# Patient Record
Sex: Female | Born: 1945 | Race: White | Hispanic: No | Marital: Married | Smoking: Former smoker
Health system: Southern US, Community
[De-identification: ages and names within clinical notes are randomized; demographics above are authoritative.]

## PROBLEM LIST (undated history)

## (undated) DIAGNOSIS — I10 Essential (primary) hypertension: Secondary | ICD-10-CM

## (undated) DIAGNOSIS — M359 Systemic involvement of connective tissue, unspecified: Secondary | ICD-10-CM

## (undated) DIAGNOSIS — I2510 Atherosclerotic heart disease of native coronary artery without angina pectoris: Secondary | ICD-10-CM

## (undated) DIAGNOSIS — E785 Hyperlipidemia, unspecified: Secondary | ICD-10-CM

## (undated) HISTORY — DX: Hyperlipidemia, unspecified: E78.5

## (undated) HISTORY — DX: Atherosclerotic heart disease of native coronary artery without angina pectoris: I25.10

## (undated) HISTORY — PX: TONSILLECTOMY: SUR1361

## (undated) HISTORY — DX: Essential (primary) hypertension: I10

---

## 1992-01-12 HISTORY — PX: CORONARY ARTERY BYPASS GRAFT: SHX141

## 1997-08-30 ENCOUNTER — Encounter: Payer: Self-pay | Admitting: Cardiology

## 1997-08-30 ENCOUNTER — Ambulatory Visit (HOSPITAL_COMMUNITY): Admission: RE | Admit: 1997-08-30 | Discharge: 1997-08-30 | Payer: Self-pay | Admitting: Cardiology

## 1998-03-26 ENCOUNTER — Encounter: Payer: Self-pay | Admitting: Cardiology

## 1998-03-26 ENCOUNTER — Ambulatory Visit (HOSPITAL_COMMUNITY): Admission: RE | Admit: 1998-03-26 | Discharge: 1998-03-26 | Payer: Self-pay | Admitting: Cardiology

## 1998-09-09 ENCOUNTER — Emergency Department (HOSPITAL_COMMUNITY): Admission: EM | Admit: 1998-09-09 | Discharge: 1998-09-09 | Payer: Self-pay | Admitting: Emergency Medicine

## 1998-09-16 ENCOUNTER — Other Ambulatory Visit: Admission: RE | Admit: 1998-09-16 | Discharge: 1998-09-16 | Payer: Self-pay | Admitting: *Deleted

## 1999-04-01 ENCOUNTER — Encounter: Payer: Self-pay | Admitting: Cardiology

## 1999-04-01 ENCOUNTER — Ambulatory Visit (HOSPITAL_COMMUNITY): Admission: RE | Admit: 1999-04-01 | Discharge: 1999-04-01 | Payer: Self-pay | Admitting: Cardiology

## 2000-03-23 ENCOUNTER — Ambulatory Visit (HOSPITAL_COMMUNITY): Admission: RE | Admit: 2000-03-23 | Discharge: 2000-03-23 | Payer: Self-pay | Admitting: Cardiology

## 2000-03-23 ENCOUNTER — Encounter: Payer: Self-pay | Admitting: Cardiology

## 2001-01-11 HISTORY — PX: ABDOMINAL AORTIC ANEURYSM REPAIR: SUR1152

## 2001-01-30 ENCOUNTER — Ambulatory Visit (HOSPITAL_COMMUNITY): Admission: RE | Admit: 2001-01-30 | Discharge: 2001-01-30 | Payer: Self-pay | Admitting: Cardiology

## 2001-01-30 ENCOUNTER — Encounter: Payer: Self-pay | Admitting: Cardiology

## 2001-10-20 ENCOUNTER — Ambulatory Visit (HOSPITAL_COMMUNITY): Admission: RE | Admit: 2001-10-20 | Discharge: 2001-10-20 | Payer: Self-pay | Admitting: Cardiology

## 2001-10-20 ENCOUNTER — Encounter: Payer: Self-pay | Admitting: Cardiology

## 2001-11-09 ENCOUNTER — Encounter: Payer: Self-pay | Admitting: Vascular Surgery

## 2001-11-13 ENCOUNTER — Inpatient Hospital Stay (HOSPITAL_COMMUNITY): Admission: RE | Admit: 2001-11-13 | Discharge: 2001-11-18 | Payer: Self-pay | Admitting: Vascular Surgery

## 2001-11-13 ENCOUNTER — Encounter: Payer: Self-pay | Admitting: Vascular Surgery

## 2001-11-14 ENCOUNTER — Encounter: Payer: Self-pay | Admitting: Vascular Surgery

## 2002-04-02 ENCOUNTER — Other Ambulatory Visit: Admission: RE | Admit: 2002-04-02 | Discharge: 2002-04-02 | Payer: Self-pay | Admitting: *Deleted

## 2002-12-31 IMAGING — CT CT PELVIS W/ CM
1 of 2 series · 11 of 32 positions shown, 17 images · IV contrast (150 ML OMNI)
Comparison: none

FINDINGS
CLINICAL DATA: KNOWN ABDOMINAL AORTIC ANEURYSM.
CT SCAN OF THE ABDOMEN WITH INTRAVENOUS CONTRAST
THE STUDY WAS PERFORMED WITH 150 CC OF OMNIPAQUE 300.
THE SOLID ORGANS OF THE ABDOMEN ARE UNREMARKABLE.  IMAGES THROUGH THE LUNG BASES ARE UNREMARKABLE.
AGAIN NOTED IS THE INFRARENAL ABDOMINAL AORTIC ANEURYSM WHICH CURRENTLY MEASURES 5 CM IN MAXIMAL
DIAMETER WHICH IS ENLARGED SINCE THE PREVIOUS STUDY.
THERE IS NO EVIDENCE OF HEMORRHAGE.
IMPRESSION
ENLARGEMENT OF THE INFRARENAL ABDOMINAL AORTIC ANEURYSM WHICH CURRENTLY MEASURES 5 CM.
CT SCAN OF THE PELVIS
THERE IS EVIDENCE OF PELVIC MASSES, ADENOPATHY OR FREE FLUID.  THERE IS A SMALL AMOUNT OF FLUID IN
THE UTERUS.
SMALL AMOUNT OF FLUID WITHIN THE UTERINE CAVITY.  OTHERWISE NEGATIVE.

[Series 2: aaa · axial · 0.73mm/px · z∈[-409,-57]mm · 11 of 171 slices shown, 17 images]
[im 15/171  soft-tissue]
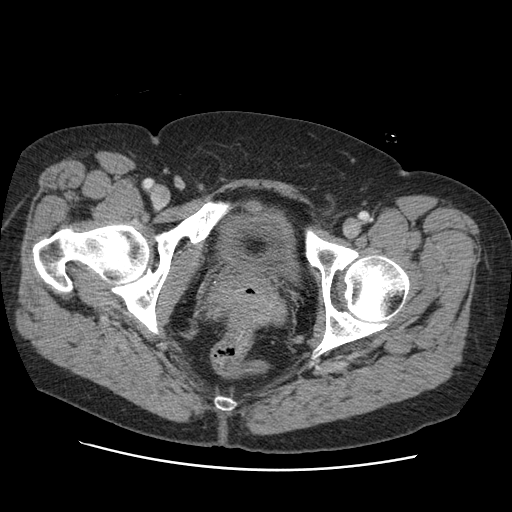
[im 15/171  bone]
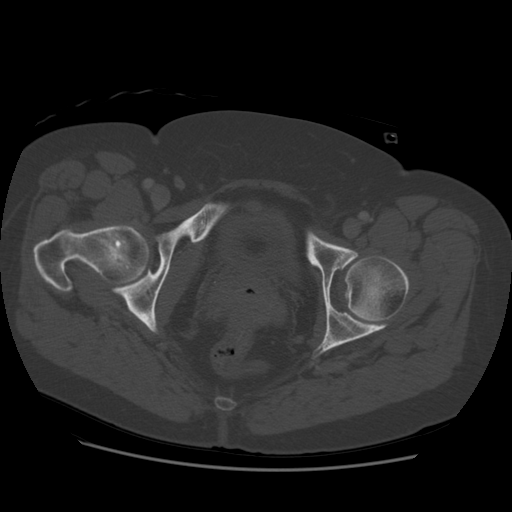
[im 29/171  soft-tissue]
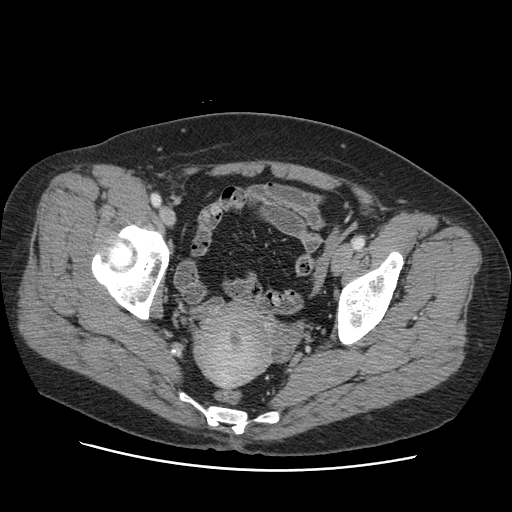
[im 43/171  soft-tissue]
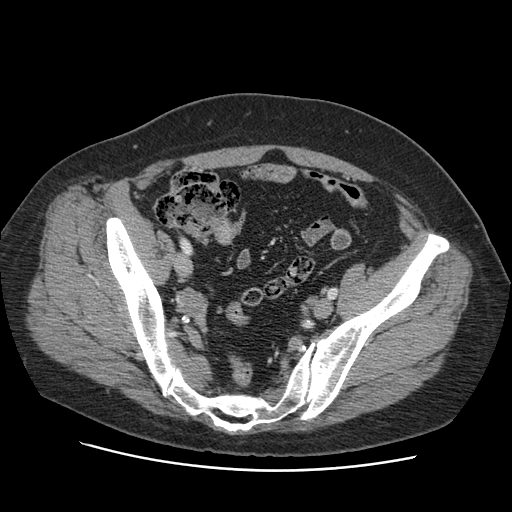
[im 57/171  soft-tissue]
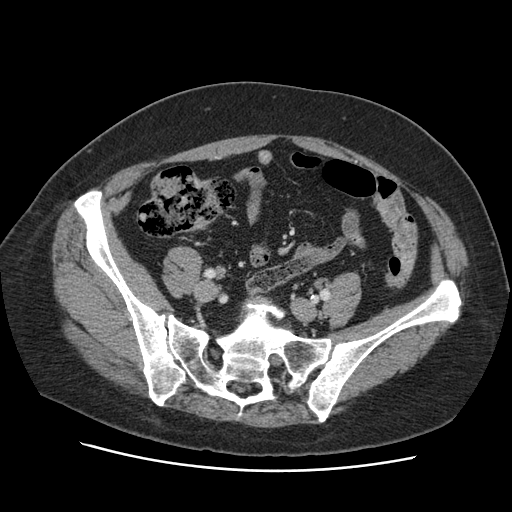
[im 71/171  soft-tissue]
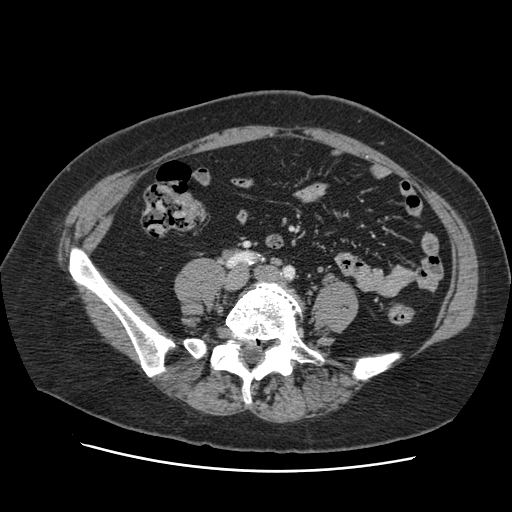
[im 86/171  soft-tissue]
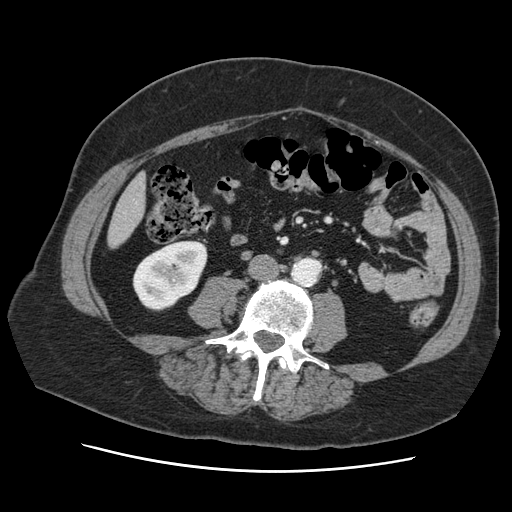
[im 100/171  soft-tissue]
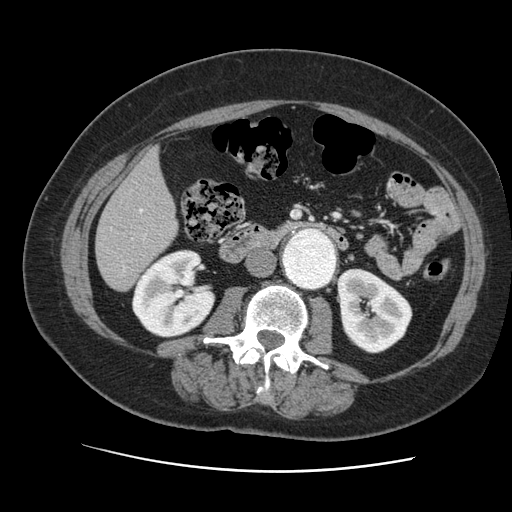
[im 114/171  soft-tissue]
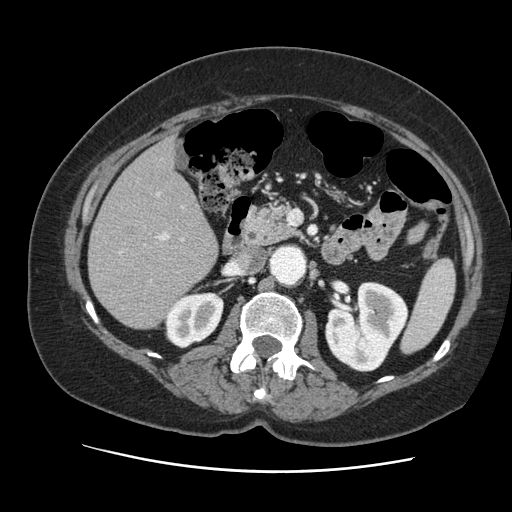
[im 114/171  lung]
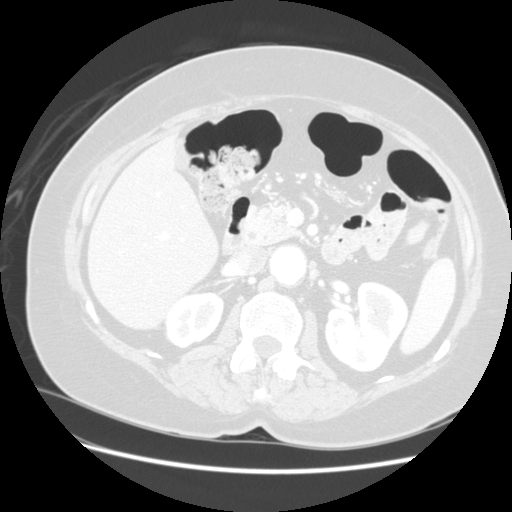
[im 128/171  soft-tissue]
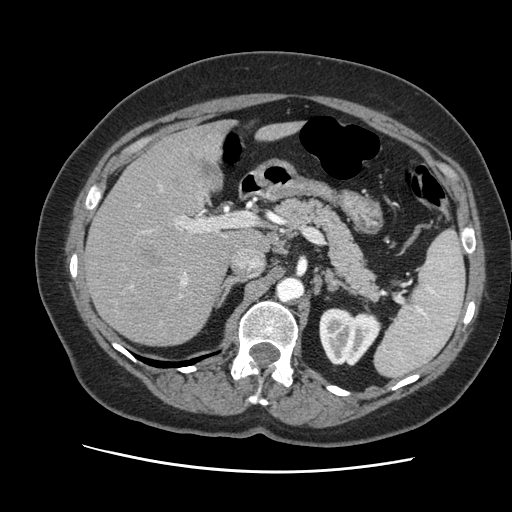
[im 128/171  lung]
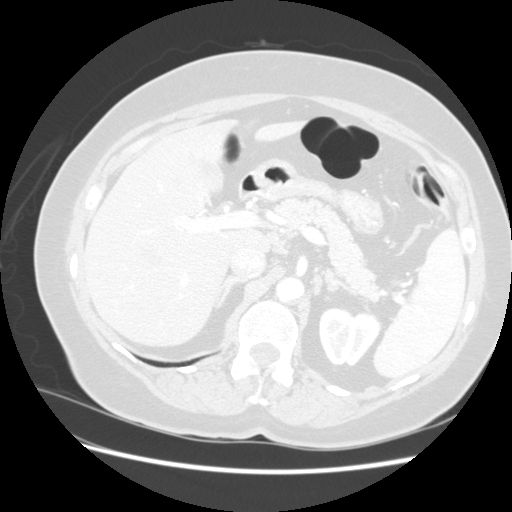
[im 128/171  bone]
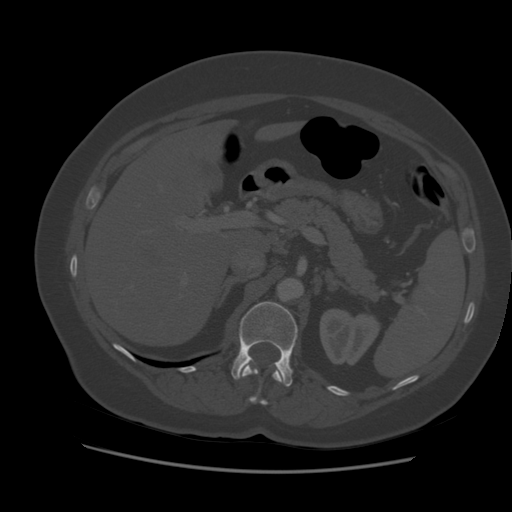
[im 142/171  soft-tissue]
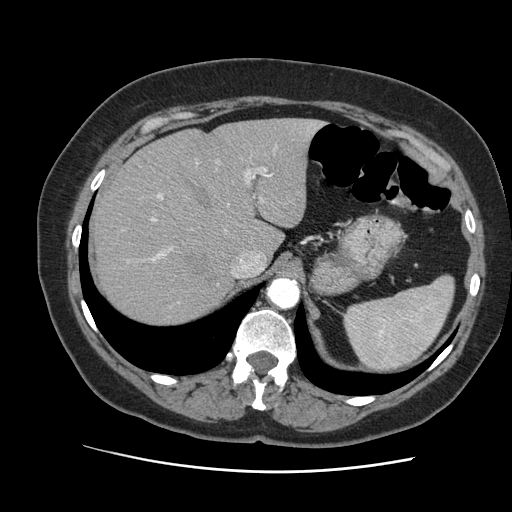
[im 142/171  lung]
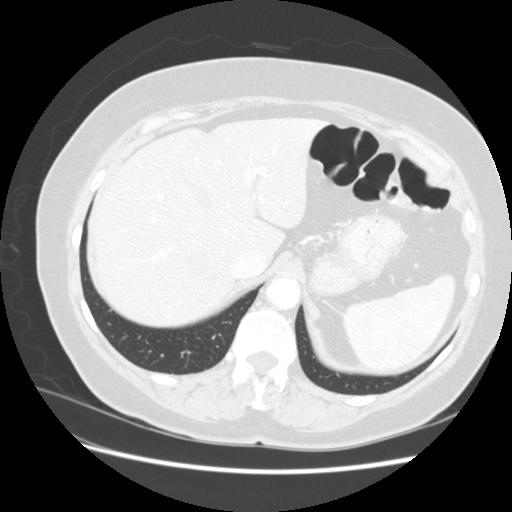
[im 156/171  soft-tissue]
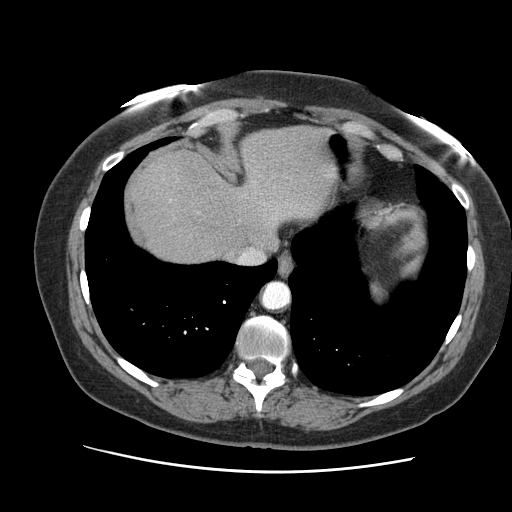
[im 156/171  lung]
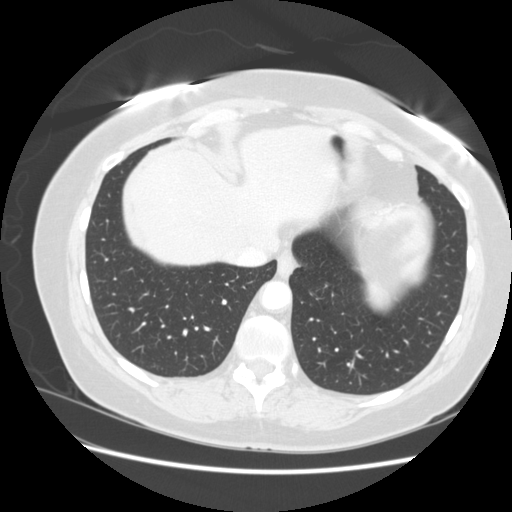

[11 of 32 positions shown; findings below may reference images not displayed]

## 2003-05-08 ENCOUNTER — Other Ambulatory Visit: Admission: RE | Admit: 2003-05-08 | Discharge: 2003-05-08 | Payer: Self-pay | Admitting: *Deleted

## 2003-07-17 ENCOUNTER — Inpatient Hospital Stay (HOSPITAL_COMMUNITY): Admission: EM | Admit: 2003-07-17 | Discharge: 2003-07-21 | Payer: Self-pay | Admitting: Emergency Medicine

## 2003-07-26 ENCOUNTER — Inpatient Hospital Stay (HOSPITAL_COMMUNITY): Admission: AD | Admit: 2003-07-26 | Discharge: 2003-07-29 | Payer: Self-pay | Admitting: General Surgery

## 2003-07-26 ENCOUNTER — Encounter: Admission: RE | Admit: 2003-07-26 | Discharge: 2003-07-26 | Payer: Self-pay | Admitting: General Surgery

## 2003-08-04 ENCOUNTER — Inpatient Hospital Stay (HOSPITAL_COMMUNITY): Admission: EM | Admit: 2003-08-04 | Discharge: 2003-08-10 | Payer: Self-pay | Admitting: General Surgery

## 2005-01-11 HISTORY — PX: COLON SURGERY: SHX602

## 2007-09-14 ENCOUNTER — Encounter: Admission: RE | Admit: 2007-09-14 | Discharge: 2007-09-14 | Payer: Self-pay | Admitting: Family Medicine

## 2007-12-06 ENCOUNTER — Inpatient Hospital Stay (HOSPITAL_COMMUNITY): Admission: RE | Admit: 2007-12-06 | Discharge: 2007-12-08 | Payer: Self-pay | Admitting: Cardiology

## 2010-02-01 ENCOUNTER — Encounter: Payer: Self-pay | Admitting: General Surgery

## 2010-05-26 NOTE — Cardiovascular Report (Signed)
NAMEREYGAN, [name] NO.:  [id number]   MEDICAL RECORD NO.:  [id number]          PATIENT TYPE:  INP   LOCATION:  6527                         FACILITY:  [facility]   PHYSICIAN:  Thereasa Solo. Little, M.D. DATE OF BIRTH:  12-Dec-1945   DATE OF PROCEDURE:  12/06/2007  DATE OF DISCHARGE:                            CARDIAC CATHETERIZATION   This 65 year old female had bypass surgery 15 years ago.  In addition to  that she has had an abdominal aortic aneurysm repair and came in for  routine followup, was asymptomatic, but had new ischemic ST-segment  changes in the lateral leads.  Initial cath was scheduled for September.  She did not follow up with this, and comes in today for cath with graft  visualization.   After obtaining informed consent, the patient was prepped and draped in  the usual sterile fashion exposing the right groin.  Following local  anesthetic with 1% Xylocaine, the Seldinger technique was employed, and  a 5-French introducer sheath was placed in the right femoral artery.  Left and right coronary arteriography, PCI to the distal right,  ventriculography in the RAO projection, and distal aortogram was  performed, and graft visualization x4.   RESULTS:  1. Hemodynamic monitoring; central aortic pressure was 190/99.  Left      ventricular pressure was 184/11.  There was a reversed gradient at      the time of pullback.  2. Ventriculography.  Ventriculography in the RAO projection done at      the end of the cath showed normal LV systolic function with an      ejection fraction excess of 65%, slight elevation of end-diastolic      pressure at 17, no wall motion abnormalities.  3. Distal aortogram done slightly above the level of the renal shows      the abdominal aortic aneurysm repair looked excellent.  4. Coronary arteriography:  The left main had a 90% mid and terminal      narrowing with a very short segment of the LAD being noted before      the entire  system is completely occluded.  5. Right coronary artery 100% occluded proximally.   GRAFTS:  1. Saphenous vein graft to the RCA:  The graft was widely patent.  It      inserted into the ongoing RCA just past the PDA.  At the insertion      site, but not involving the graft, was a focal area of 80%      narrowing.  There was mild irregularities in the ongoing RCA.  The      PDA was small with irregularities and there were left-to-right      collaterals noted, and there were right-to-left and left-to-right      collaterals noted.   Saphenous vein graft to the OM.  The graft itself was widely patent.  The OM system was small with no significant disease.  1. Internal mammary artery to the LAD:  The IMA was widely patent.      The LAD was widely patent.  2.  Saphenous vein graft to both the diagonal and the ramus:  The graft      was widely patent.  Both the diagonal and the ramus were visualized      and free of significant disease.  There was also filling of the LAD      and there were some collaterals from this system to the distal      right.  3. Percutaneous intervention to the distal right coronary artery:  The      system was upgraded to a 6-French.  A JR3.5 guide catheter was used      and a short loose wire was used.  The wire was placed well past the      obstruction of the distal portion of the RCA around the posterior      lateral area.  A Flextome balloon was then placed in the area of      obstruction with a total of 4 inflations ranging from 6 atmospheres      for 37 seconds to 12 atmospheres for 43 seconds.  The area that had      been 80% narrowed pre-intervention appeared to be less than 10%      narrowed.  There was no evidence of any dissection or thrombus.   The patient was given Angiomax IV prior to the procedure, and a  therapeutic ACT was obtained before the procedure was started.  At the  end of the procedure, she was given 300 mg of oral Plavix.   In addition  to the oral Plavix, she received 4 mg of IV Versed and 50  mcg of IV fentanyl.   TOTAL CONTRAST:  195 mL   COMPLICATIONS:  None.   EQUIPMENT:  5-French Judkins configuration catheters with an LCB graft  required to cannulate the graft to the diagonal and ramus system.   She will need to be on Plavix for approximately 90 days.  She should be  ready for discharge in the morning.           ______________________________  Thereasa Solo. Little, M.D.     ABL/MEDQ  D:  12/06/2007  T:  12/06/2007  Job:  [id number]   cc:   Biral S. Nelson Chimes, MD  Cath Lab

## 2010-05-26 NOTE — Discharge Summary (Signed)
[name], [name] NO.:  [id number]   MEDICAL RECORD NO.:  [id number]          PATIENT TYPE:  INP   LOCATION:  6527                         FACILITY:  [facility]   PHYSICIAN:  Thereasa Solo. Little, M.D. DATE OF BIRTH:  [birth year]   DATE OF ADMISSION:  12/06/2007  DATE OF DISCHARGE:  12/07/2007                               DISCHARGE SUMMARY   DISCHARGE DIAGNOSES:  1. Progressive coronary artery disease status post cardiac      catheterization with progression in her native right carotid artery      past her graft insertion site.  She underwent cutting balloon      angioplasty to the lesion reduced from 80 to less than 10%.  Plan      was for her to have Plavix x90 days.  2. Prior history of coronary artery disease status post coronary      artery bypass grafting in 1994.  She had a left internal mammary      artery to the left anterior descending coronary artery and      saphenous vein graft to right carotid artery and saphenous vein      graft to obtuse marginal and saphenous vein graft to her ramus      intermedius and saphenous vein graft to her diagonal.  Cardiac      catheterization revealed that she had patent grafts.  3. Normal ejection fraction, greater than 65%.  4. History of abdominal aortic aneurysm, repaired by Dr. Freida Busman in      2005.  5. 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  6. Hyperlipidemia.  7. Hypertension.  Per Dr. Fredirick Maudlin office notes, he is considering      increasing her Exforge for continued high blood pressure to 10/320.   LABS:  At the time of this dictation, no labs assessment performed.  She  is to have labs in the morning of December 07, 2007.   DISCHARGE MEDICATIONS:  1. Aspirin 325 mg a day.  2. Prilosec 20 mg a day.  3. Prozac 20 mg a day.  4. Exforge 5/160 every day.  5. Fish oil daily.  6. Crestor 20 mg every day.  7. Plavix 75 mg a day x3 months.  8. Nitroglycerin 150 under tongue  every 5 minutes x3 when needed for      chest pain.   OTHER DISCHARGE INSTRUCTIONS:  She is to do no strenuous activity,  lifting, pushing, pulling, exercise, or extended walking x1 week.  If  she has any problems with her groin, she will call.  She will follow up  Dr. Clarene Duke on December 12, 2007 at 11:30.  She should do no driving for 2  days.   HOSPITAL COURSE:  [name] is a 65 year old female who came for an  elective heart catheterization as an outpatient.  She apparently had  changes in her EKG that were concerning.  She was asymptomatic.  However, she had been asymptomatic up to the time of her diagnosis of  severe 3-vessel disease in her coronary artery  bypass grafting.  Dr.  Clarene Duke decided that she should undergo cardiac catheterization.  She has  had a negative nuclear test in 2007.  Cardiac catheterization was  performed, and she was found to have high-grade native RCA disease past  the  insertion of her SVG to RCA graft, she underwent a cutting balloon  procedure, which decreased her lesion from 80% to less than 10%.  She is  pending discharge on December 07, 2007, depending on her blood pressure  __________ and her labs to be performed in the morning of December 07, 2007.      [name], N.P.    ______________________________  Thereasa Solo. Little, M.D.    BB/MEDQ  D:  12/06/2007  T:  12/07/2007  Job:  [id number]   cc:   Nolon Nations, MD

## 2010-05-29 NOTE — H&P (Signed)
[name], HOLQUIN                            ACCOUNT NO.:  [id number]   MEDICAL RECORD NO.:  [id number]                   PATIENT TYPE:  INP   LOCATION:  0444                                 FACILITY:  [facility]   PHYSICIAN:  Abigail Miyamoto, M.D.              DATE OF BIRTH:  06-12-1945   DATE OF ADMISSION:  08/04/2003  DATE OF DISCHARGE:                                HISTORY & PHYSICAL   HISTORY OF PRESENT ILLNESS:  This is a 65 year old female who has had  several admissions for small bowel obstructions recently by Dr. [name] for  her latest admissions now with repeat abdominal pain, nausea, and dry heaves  that started earlier today.  Her last admission was several weeks ago.  However, she has been admitted to the hospital and gotten into her hospital  room her pain has totally resolved and she has had a bowel movement.  She  denies fevers, chills, chest pains, or shortness of breath.   PAST MEDICAL HISTORY:  1. Significant for coronary artery disease, status post coronary artery     bypass grafting.  2. She is also status post abdominal aortic aneurysm repair.  3. She has a history of GERD.  4. Depression.   ALLERGIES:  She has no known drug allergies.   MEDICATIONS:  1. Norvasc.  2. Ortho Tri-Cyclen.  3. Crestor.  4. __________.  5. __________.   PHYSICAL EXAMINATION:  GENERAL:  Reveals a well-developed, well-nourished  female in no acute distress.  VITAL SIGNS:  She is afebrile.  Vital signs are stable.  LUNGS:  Clear to auscultation bilaterally with normal respiratory effort.  CARDIOVASCULAR:  Regular rate and rhythm.  No new murmurs.  There is no  peripheral edema.  ABDOMEN:  Soft.  It is nontender, nondistended.  There are rare bowel  sounds.  She has a well-healed midline incision with no hernias.  There is  no organomegaly.  EXTREMITIES:  Warm and well-perfused.   LABORATORY DATA:  X-ray data:  The patient has two view of the abdomen  showing her to have  dilated loops of small bowel.  There is air in her  colon.  There is no free air, no air fluid levels.   The patient has an elevated white blood count of 15,6, hemoglobin of 14,  hematocrit of 40.8 with platelets of 328.  BUN and creatinine are 9 and 0.9,  potassium is 3.8.   IMPRESSION/PLAN:  Patient with recurrent small bowel obstruction at this  point which appears to be spontaneously resolving.   PLAN:  Plan at this point will be to keep her n.p.o. and IV rehydrate her  and repeat films in the morning.  I have discussed this with Dr. [name]  and he may plan exploration this week given her multiple frequent admissions  for obstructions.  At this point given her physical examination, there is no  evidence of  compromised bowel or need for emergent surgery.                                               Abigail Miyamoto, M.D.    DB/MEDQ  D:  08/04/2003  T:  08/04/2003  Job:  [id number]

## 2010-05-29 NOTE — Discharge Summary (Signed)
[name]                            ACCOUNT NO.:  [id number]   MEDICAL RECORD NO.:  [id number]                   PATIENT TYPE:  INP   LOCATION:  0444                                 FACILITY:  [facility]   PHYSICIAN:  Sharlet Salina T. Hoxworth, M.D.          DATE OF BIRTH:  1945-12-11   DATE OF ADMISSION:  08/04/2003  DATE OF DISCHARGE:  08/10/2003                                 DISCHARGE SUMMARY   DISCHARGE DIAGNOSIS:  Small bowel obstruction secondary to adhesions.   OPERATIVE PROCEDURE:  Laparotomy and lysis of adhesions and small intestinal  stricturoplasty, August 05, 2003.   HISTORY OF PRESENT ILLNESS:  [name] is a 65 year old female, status post  aortofemoral bypass.  She has had several episodes of intermittent small  bowel obstructions in the recent months following her bypass.  This is one  of several admissions within the last month with typical findings of small  bowel obstruction on x-ray and symptoms of mid abdominal pain, nausea and  vomiting.   PAST MEDICAL HISTORY:  Status post coronary artery bypass graft and  abdominal aortic aneurysm repair.  She has a history of GERD and depression.  Also, hypertension and elevated cholesterol.   MEDICATIONS:  1.  Norvasc.  2.  Ortho Tri-Cyclen.  3.  Crestor.  4.  Zetia.  5.  Omeprazole.   No known drug allergies.   SOCIAL HISTORY:  See admitting H&P.   FAMILY HISTORY:  See admitting H&P.   REVIEW OF SYSTEMS:  See admitting H&P.   PHYSICAL EXAMINATION:  VITAL SIGNS:  She is afebrile.  Vital signs normal.  GENERAL:  A well-developed white female.  ABDOMEN:  Soft.  Nontender.  Not particularly distended.   LABORATORY/X-RAY:  White count 15.6 thousand.  Hemoglobin normal.   KUB showed dilated loops of small bowel, consistent with partial small bowel  obstruction.   HOSPITAL COURSE:  Patient was admitted and made n.p.o.  Clinically improved.  Due to repeated episodes, we elected to proceed with laparotomy.   This was  accomplished on August 05, 2003.  She had a single band-like adhesion with  some scarring and narrowing of the small bowel chronically of this area and  underwent a stricturoplasty as well as lysis of adhesions.  Her  postoperative course was smooth.  She had some significant incisional pain  that responded to Dilaudid, PCA, and Toradol.  NG tube was removed on the  third postoperative day.  She had some hypokalemia, which was replaced IV.  By the fourth postoperative day, she was started on clear liquids, which she  tolerated well and had active bowel sounds.  A soft, nontender abdomen.  By  the fifth postoperative day, she was tolerating a liquid diet well.  Abdomen  was benign.  She was healing nicely, and she was felt ready for discharge.   DISCHARGE MEDICATIONS:  The same as admission plus Tylox or Tylenol as  needed for pain.   FOLLOW UP:  In my office in one week.      BTH/MEDQ  D:  09/30/2003  T:  10/01/2003  Job:  [id number]

## 2010-05-29 NOTE — Discharge Summary (Signed)
[name], [name]                            ACCOUNT NO.:  [id number]   MEDICAL RECORD NO.:  [id number]                   PATIENT TYPE:  INP   LOCATION:  5709                                 FACILITY:  [facility]   PHYSICIAN:  Sharlet Salina T. Hoxworth, M.D.          DATE OF BIRTH:  [birth year]   DATE OF ADMISSION:  07/26/2003  DATE OF DISCHARGE:  07/30/2003                                 DISCHARGE SUMMARY   DISCHARGE DIAGNOSIS:  Small bowel obstruction.   OPERATION/PROCEDURE:  None.   HISTORY OF PRESENT ILLNESS:  This is a readmission for this 65 year old  white female with a history of abdominal aortic aneurysm repair in 2003.  She has a history of being treated for small bowel obstruction in the past  year in [facility] not requiring surgery.  She was discharged five days  prior to this admission following another episode of small bowel obstruction  that resolved spontaneously.  However, soon after discharge and now for  about the last three to four days she has developed increasing crampy mid  abdominal pain, nausea, and occasional vomiting.  She has not had a bowel  movement in four days.   PAST MEDICAL HISTORY:  As above plus history of coronary artery bypass  grafting and GERD.  For details please see previous history and physical of  July 15, 2003.   PHYSICAL EXAMINATION:  VITAL SIGNS:  She is afebrile.  Vital signs all  within normal limits.  GENERAL:  Well-developed white female, appears uncomfortable.  ABDOMEN:  Active bowel sounds, mild distention.  There is a healed incision  without hernias, mild tenderness diffusely without greater somewhat greater  on the left than the right.   LABORATORIES:  Flat and upright abdominal x-rays showing small bowel  obstruction.   HOSPITAL COURSE:  Patient was admitted, treated with IV fluids, and made  n.p.o.  She was significantly better on the 16th with decreased abdominal  pain and minimal tenderness.  White count had been elevated on  admission to  12.9, was decreased to 10.4.  She was continued n.p.o. and on the 17th had  passed flatus and pain was resolved.  She was started on clear liquids.  She  tolerated these well.  Was advanced to a regular diet on July 19 and was  asymptomatic with a normal bowel movement and soft and nontender abdomen.  She is discharged home at this time and is to return to my office in four  days for further follow-up and discussion regarding possible elective  laparotomy for recurrent bowel obstruction.                                                Lorne Skeens. Hoxworth, M.D.    WUJ/WJXB  D:  09/09/2003  T:  09/10/2003  Job:  [id number]

## 2010-05-29 NOTE — H&P (Signed)
[name], SALSBERRY                            ACCOUNT NO.:  [id number]   MEDICAL RECORD NO.:  [id number]                   PATIENT TYPE:  EMS   LOCATION:  MAJO                                 FACILITY:  [facility]   PHYSICIAN:  Sharlet Salina T. Hoxworth, M.D.          DATE OF BIRTH:  30-Aug-1945   DATE OF ADMISSION:  07/16/2003  DATE OF DISCHARGE:                                HISTORY & PHYSICAL   CHIEF COMPLAINT:  Abdominal pain.   HISTORY OF PRESENT ILLNESS:  [name] is a 65 year old white female who  about 12 hours prior to this admission had the onset of abdominal pain.  She  describes a constant pain in her mid abdomen but also more severe crampy  exacerbations also in the mid abdomen.  She has been nauseated and vomited  x1 in the emergency room that was described as feculent.  She has had no  bowel move in 3 days.  No fever or chills.  She had a similar episode about  4 months ago and was hospitalized at [facility].  She was  told she had bowel obstruction.  This resolved with NG suction and IV  fluids.  She feels her abdomen is distended.  She has some chronic heart  burn but no other chronic GI complaints.   PAST MEDICAL HISTORY:  1. Surgery significant for abdominal aortic aneurysm repair in 2003.  2. She has coronary artery disease status post coronary bypass graft     followed by Dr. 3.  3.  Little.  Also treated for hypertension and     elevated cholesterol.  3. GERD.  4. History of depression.   CURRENT MEDICATIONS:  1. Norvasc 5 mg daily.  2. Ortho-____1 daily.  3. Crestor 1 daily.  4. Zetia 10 mg daily.  5. Omeprazole 20 mg daily.   ALLERGIES:  No known drug allergies.   SOCIAL HISTORY:  She is married.  Not employed.  She smokes occasional  cigarettes, does not drink alcohol.   FAMILY HISTORY:  Noncontributory.   REVIEW OF SYSTEMS:  GENERAL:  No fever, chills, weight change.  RESPIRATORY:  No shortness of breath, cough, wheezing.  CARDIAC:   Denies chest pain,  palpitations, leg swelling.  ABDOMEN/GI:  As above.  GU:  No urinary  burning, frequency.  ORTHO:  No joint pain, swelling.   PHYSICAL EXAMINATION:  VITAL SIGNS:  Temperature 97.6, pulse 70, blood  pressure 84/36, respirations 22.  GENERAL:  Well-developed white female, alert, in no acute distress.  SKIN:  Warm and dry without rash or infection.  HEENT:  No palpable mass or thyromegaly.  Sclerae non icteric. Nares and  oropharynx clear.  LUNGS:  Clear to auscultation without increased work of breathing.  CARDIAC:  Healed sternotomy.  No edema.  Peripheral pulses intact.  Regular  rate and rhythm without murmurs.  ABDOMEN:  A healed midline incision, no hernias. Mild  distention, bowel  sounds are present.  The abdomen is soft without significant tenderness.  No  palpable masses or organomegaly.  EXTREMITIES++S:  No joint swelling or deformity.  NEUROLOGIC:  She is alert, motor exam is grossly normal.   LABORATORY DATA:  Amylase 55, lipase 22.  Electrolytes:  abnormal potassium  of 3.1, BUN and creatinine are normal, glucose 138.  Liver function tests  normal.  White count is elevated at 16.8 thousand, hemoglobin 15.6.  Flat  and upright abdominal x-ray shows some minimally dilated loops of small  bowel and a few air fluid levels consistent with early or partial small  bowel obstruction.   ASSESSMENT AND PLAN:  Probable early small bowel obstruction secondary to  adhesions. The patient will be admitted, NG tube placed and treated with IV  fluids, pain and nausea medication.  Will follow closely with abdominal exam  and serial abdominal x-rays.                                                Lorne Skeens. Hoxworth, M.D.    Tory Emerald  D:  07/17/2003  T:  07/17/2003  Job:  [id number]

## 2010-05-29 NOTE — H&P (Signed)
[name], [name]                            ACCOUNT NO.:  [id number]   MEDICAL RECORD NO.:  [id number]                   PATIENT TYPE:  INP   LOCATION:  NA                                   FACILITY:  [facility]   PHYSICIAN:  Larina Earthly, M.D.                 DATE OF BIRTH:  04/01/1945   DATE OF ADMISSION:  11/13/2001  DATE OF DISCHARGE:                                HISTORY & PHYSICAL   CHIEF COMPLAINT:  Abdominal aortic aneurysm.   HISTORY OF PRESENT ILLNESS:  The patient is a 65 year old female who was  referred to Larina Earthly, M.D., by [name], M.D.  During routine  cardiac catheterization for follow-up of coronary artery bypass grafting,  she was found to have an asymptomatic AAA.  This has been followed serially  by [name] and now measures 5 cm.  He has recommended surgical repair at  this time.  She denies any abdominal pain, nausea, vomiting, constipation,  hematochezia, back pain, hematemesis, or symptoms of claudication.   PAST MEDICAL HISTORY:  1. Hypercholesterolemia.  2. Coronary artery disease.  3. Hypertension.  4. Depression.   PAST SURGICAL HISTORY:  Coronary artery bypass grafting.   ALLERGIES:  No known medication allergies.   MEDICATIONS:  1. Lipitor 80 mg p.o. q.d.  2. Lexapro 20 mg p.o. q.d.  3. Norvasc 5 mg p.o. q.d.  4. Xanax 0.25 mg p.o. p.r.n.  5. Ortho-Prefest.  6. Prevacid 30 mg p.o. q.d.   FAMILY HISTORY:  The patient's mother is deceased at 61.  She had ovarian  cancer.  Her father is deceased at age 34.  He had coronary artery disease.   SOCIAL HISTORY:  The patient is married and lives with her husband.  She has  three children.  She denies any alcohol use.  She smokes four cigarettes per  day.   REVIEW OF SYMPTOMS:  GENERAL APPEARANCE:  The patient denies any fever,  chills, night sweats, or frequent illnesses.  HEAD:  The patient denies any  head injuries of loss of consciousness.  EYES:  The patient denies any  glaucoma or cataracts.  EARS:  The patient denies any tinnitus, hearing  loss, or ear infections.  NOSE:  The patient denies any epistaxis, rhinitis,  or sinusitis.  MOUTH:  The patient denies any problems with dentition or  sore throat.  NECK:  The patient denies any muscle tenderness or pain on  range of motion of her neck.  CARDIOVASCULAR:  The patient denies any  cardiac arrhythmias or chest pain.  She did have coronary artery bypass  grafting.  PULMONARY:  The patient denies any asthma, pulmonary edema, or  pneumonia.  GASTROINTESTINAL:  The patient denies any nausea, vomiting,  diarrhea, constipation, hematochezia, or melena.  GENITOURINARY:  The  patient denies any urinary tract infections or incontinence.  MUSCULOSKELETAL:  The patient denies  any arthritis, arthralgias, or  myalgias.  NEUROLOGIC:  The patient denies any stroke or transient ischemic  attack.   PHYSICAL EXAMINATION:  VITAL SIGNS:  The blood pressure is 130/70, the pulse  is 72 and regular, and respirations are 18.  GENERAL APPEARANCE:  The patient is alert and oriented x 3.  She is in no  acute distress.  HEENT:  Head:  Normocephalic and atraumatic.  Eyes:  Pupils equal, round,  and reactive to light and accomodation.  Extraocular motions are intact  without scleral icterus.  Ears:  Auditory acuity is grossly intact.  Nose:  Nasal patency is intact.  Sinuses nontender.  Mouth:  Moist without  exudates.  NECK:  Supple without JVD, lymphadenopathy, carotid bruits, or thyromegaly.  LUNGS:  Clear to auscultation without rales, rhonchi, or wheezes.  CARDIOVASCULAR:  Regular rate and rhythm without murmurs, rubs, or gallops.  ABDOMEN:  Soft, nontender, and nondistended.  Positive bowel sounds in all  four quadrants.  EXTREMITIES:  No cyanosis, clubbing, or edema.  She had 1+ dorsalis pedis  pulses bilaterally, 2+ femoral pulses, and 2+ carotid pulses.  NEUROLOGIC:  Cranial nerves II-XII grossly intact.  She had no  focal  deficits.  Her gait was steady.   IMPRESSION:  Abdominal aortic aneurysm.   PLAN:  Resection and grafting of AAA by Larina Earthly, M.D.     Levin Erp. Steward, P.A.                      Larina Earthly, M.D.    BGS/MEDQ  D:  11/09/2001  T:  11/09/2001  Job:  [id number]

## 2010-05-29 NOTE — Discharge Summary (Signed)
[name], [name]                            ACCOUNT NO.:  [id number]   MEDICAL RECORD NO.:  [id number]                   PATIENT TYPE:  INP   LOCATION:  2001                                 FACILITY:  [facility]   PHYSICIAN:  Larina Earthly, M.D.                 DATE OF BIRTH:  07-Apr-1945   DATE OF ADMISSION:  11/13/2001  DATE OF DISCHARGE:  11/18/2001                                 DISCHARGE SUMMARY   ADMISSION DIAGNOSIS:  Abdominal aortic aneurysm.   DISCHARGE DIAGNOSES:  1. Abdominal aortic aneurysm.  2. Hypercholesterolemia.  3. History of coronary artery disease.  4. Hypertension.  5. Depression.   PROCEDURES:  Repair of abdominal aortic aneurysm with 14 mm straight Dacron  graft.   HISTORY OF PRESENT ILLNESS:  The patient is a 65 year old female with a  history of coronary artery disease who is followed by Dr. Caprice Kluver.  During  recent routine cardiac catheterization for followup after coronary artery  bypass grafting, she was found to have an asymptomatic abdominal aortic  aneurysm.  She has undergone serial ultrasounds and has been seen by Dr.  Tawanna Cooler Early.  Most recently, she was noted to have an increase in the size of  the aneurysm now to 5 cm.  In was felt that, in light of the increase in  size of her aneurysm which was previously 3.4 cm, she should proceed with  surgical repair at this time.   HOSPITAL COURSE:  She was admitted on 11/13/2001 and taken to the operating  room where she underwent repair of her abdominal aortic aneurysm.  She  tolerated the procedure well and was transferred to the SICU in stable  condition.  Postoperatively, she has done well.  She was hemodynamically  stable, extubated, and was doing well on postop day #1 and at that time was  able to be transferred to the floor.  She was mobilized and started work on  pulmonary toilet measures.   By postop day #3, she started to have some return of bowel function.  At  that time, her diet was  advanced to clear liquids.  She tolerated this well  and by postop day #4 was restarted on a regular diet.  She has continued to  have good bowel function with both flatus and a bowel movement.  She is  tolerating a regular diet.  She has remained afebrile.  All vital signs have  been stable.  Her incision is healing well.  It is felt that, if she  continues to remain stable, she will be ready for discharge home on  11/18/2001.   DISCHARGE MEDICATIONS:  1. Tylox 1 to 2 q.4h. p.r.n. for pain.  2. Lipitor 80 mg q.d.  3. Lexapro 20 mg q.d.  4. Norvasc 5 mg q.d.  5. Xanax 0.25 mg as needed.  6. Ortho-Prefest 1 q.d.  7. Prevacid 30  mg q.d.   DISCHARGE INSTRUCTIONS:  She is to refrain from driving, heavy lifting, or  strenuous activity.  She may continue daily walking and use of incentive  spirometer.  She is asked to shower daily and clean her incisions with soap  and water.  She is asked to continue her preoperative diet.  She will follow  up in the [facility] office in one week for stable removal with the nurse.  She  will then see Dr. Arbie Cookey and have ABIs checked on Wednesday,  November 26, at  9 a.m.  She is asked to call our office if she has any problems in the  interim.     Coral Ceo, P.A.                        Larina Earthly, M.D.    GC/MEDQ  D:  11/17/2001  T:  11/19/2001  Job:  [id number]   cc:   Thereasa Solo. Little, M.D.  [address]  [place] [address]  Fax: [phone]

## 2010-05-29 NOTE — Op Note (Signed)
NAMEGLENDA, Blanchard                            ACCOUNT NO.:  [id number]   MEDICAL RECORD NO.:  [id number]                   PATIENT TYPE:  INP   LOCATION:  0444                                 FACILITY:  [facility]   PHYSICIAN:  Sharlet Salina T. Hoxworth, M.D.          DATE OF BIRTH:  1945-06-04   DATE OF PROCEDURE:  08/05/2003  DATE OF DISCHARGE:                                 OPERATIVE REPORT   PREOPERATIVE DIAGNOSIS:  Small bowel obstruction.   POSTOPERATIVE DIAGNOSIS:  Small bowel obstruction.   SURGICAL PROCEDURE:  Laparotomy with lysis of adhesions and small bowel  stricturoplasty.   SURGEON:  Dr. [name].   ASSISTANT:  Dr. Danna Hefty.   ANESTHESIA:  General.   BRIEF HISTORY:  [name] is a 65 year old female status post aortobifemoral  bypass graft approximately two years ago.  She has now presented with three  episodes of small bowel obstruction within the past three weeks with classic  symptoms and dilated small bowel on x-ray that each time has rapidly  resolved.  She presented yesterday with her third episode and as previously  symptoms quickly resolved.  But due to the repeated nature we have elected  to proceed with laparotomy and lysis of adhesions.  The nature of the  procedure, indications, risks of bleeding, infection and bowel injury were  discussed and understood.  __________ procedure.   DESCRIPTION OF PROCEDURE:  Patient brought to the operating room and placed  in the supine position on the operating table.  General endotracheal  anesthesia was induced.  A Foley catheter and nasogastric tube was placed.  She was given preoperative antibiotics.  The abdomen was widely sterilely  prepped and draped.  The midportion of the previous long midline incision  was used and dissection carried down through the subcutaneous tissue using  cautery.  The fascia was divided in the midline and the peritoneum entered  under direct vision.  There were minimal adhesions of  the omentum to the  anterior abdominal wall which were taken down with the cautery.  The small  bowel was relatively free of adhesions.  The ligament of Treitz was  identified and the proximal small bowel was moderately dilated.  The bowel  was traced distally and at about the proximal ileum there was a single  string-like adhesive band from the antimesenteric border of the bowel to a  more distal loop of bowel.  This clearly appeared to be an area of twist and  the bowel distal to this was completely decompressed.  About a centimeter or  two proximal to this band was a chronic appearing stricture with a fibrotic  band over the circumference of the bowel.  The single adhesion was divided.  The bowel was then carefully inspected and the area just proximal to this  appeared to be a significant fibrotic stricture narrowing the lumen down to  smaller than half its normal size and  really could not pass a fingertip  through this.  This appeared to be likely secondary to the band just distal  to it.  We elected to perform an enteroenterostomy at this site.  This was  performed with a GIA stapler through two enterotomies just proximal and  distal to the stricture and a side-to-side widely open enteroenterostomy was  created.  The staple line was inspected for hemostasis, appeared complete.  The common defect was then elevated with three Allis clamps and closed with  a single firing of the TA-60 stapler.  This provided a broad lumen.  Staple  lines were inspected for bleeding and were dry and intact.  The viscera was  then returned to the abdomen.  Gloves were changed.  The abdomen was  thoroughly irrigated with saline and hemostasis assured.  The omentum was  placed back over the bowel.  The midline fascia  was closed with running #1 PDS begun at either end of the incision and tied  centrally.  The subcutaneous tissue was irrigated with saline, skin closed  with staples.  Sponge, needle and  instrument counts were correct.  A dry,  sterile dressing was applied and the patient taken to recovery in good  condition.                                               Lorne Skeens. Hoxworth, M.D.    Tory Emerald  D:  08/05/2003  T:  08/05/2003  Job:  [id number]

## 2010-05-29 NOTE — Discharge Summary (Signed)
[name], [name]                            ACCOUNT NO.:  [id number]   MEDICAL RECORD NO.:  [id number]                   PATIENT TYPE:  INP   LOCATION:  5711                                 FACILITY:  [facility]   PHYSICIAN:  Sharlet Salina T. Hoxworth, M.D.          DATE OF BIRTH:  October 08, 1945   DATE OF ADMISSION:  07/17/2003  DATE OF DISCHARGE:  07/21/2003                                 DISCHARGE SUMMARY   DISCHARGE DIAGNOSIS:  Small bowel obstruction.   OPERATIONS AND PROCEDURES:  None.   HISTORY OF PRESENT ILLNESS:  [name] is a 65 year old white female who 12  hours prior to this admission had the onset of abdominal pain.  She  describes constant and crampy pain in her mid abdomen.  She had been  nauseated and vomited once in the emergency room, described as feculent.  She has not had a bowel movement in three days.  No fever or chills.  She  had a similar episode four months ago and was hospitalized at [facility].  She was told that she had a bowel obstruction and this  resolved with NG suction and IV fluids.  She feels her abdomen is distended.  She has some chronic heartburn, but no other chronic GI complaints.   PAST SURGICAL HISTORY:  1. Abdominal aortic aneurysm repair in 2003.  2. She has had coronary artery bypass graft.   PAST MEDICAL HISTORY:  1. Hypertension.  2. Elevated cholesterol.  3. GERD.  4. History of depression.   MEDICATIONS:  1. Norvasc 5 mg daily.  2. Hormone replacement daily.  3. Crestor daily.  4. Zetia 10 mg a day.  5. Omeprazole 20 mg daily.   No known drug allergies.   SOCIAL HISTORY/FAMILY HISTORY/REVIEW OF SYSTEMS:  See detailed H&P.   PERTINENT PHYSICAL EXAMINATION:  Limited to the abdomen.  ABDOMEN:  This revealed a healed midline incision without hernias.  Mild  distension.  Bowel sounds were present.  Abdomen was soft without  significant tenderness.   LABORATORY:  Potassium 3.1, glucose 138, white count was  elevated at  16.8000, hemoglobin 15.6.   Flat and upright abdominal x-rays showed some minimally distended loops of  small bowel and a few air fluid levels consistent with early or partial  small bowel obstruction.   HOSPITAL COURSE:  The patient was admitted with impression of small bowel  obstructions secondary to adhesions.  NG tube was placed.  She was treated  with pain medication and IV fluids and close observation.  On July 6, she  had markedly decreased pain and had flatus.  By July 7, she had a low grade  temperature of 100.6, but continued to feel better.  NG output was decreased  with 150 per shift.  Flat and upright abdominal x-ray, however, showed some  slight increase in small bowel distension.  White count was now normal at  6.2.  Overall she seemed clinically improved, despite x-ray findings.  Treatment with IV fluids and NG continued.  On July 8, she continued to have  flatus.  KUB showed improved small bowel distension.  NG tube was removed on  July 9.  She was started on a clear liquid diet, which she tolerated well.  Follow-up KUB on July 10 showed resolved SBO.  She had had flatus and bowel  movements.  She was tolerating a regular diet.  She was discharged home at  this time.  Follow-up is in my office in one week.                                                Lorne Skeens. Hoxworth, M.D.    Tory Emerald  D:  08/14/2003  T:  08/14/2003  Job:  [id number]

## 2010-05-29 NOTE — Op Note (Signed)
[name], [name]                            ACCOUNT NO.:  [id number]   MEDICAL RECORD NO.:  [id number]                   PATIENT TYPE:  INP   LOCATION:  2001                                 FACILITY:  [facility]   PHYSICIAN:  Larina Earthly, M.D.                 DATE OF BIRTH:  1945-06-25   DATE OF PROCEDURE:  11/13/2001  DATE OF DISCHARGE:  11/18/2001                                 OPERATIVE REPORT   PREOPERATIVE DIAGNOSIS:  Abdominal aortic aneurysm.   PROCEDURE:  Resection and graft of abdominal aortic aneurysm with a 14-mm  straight Dacron graft.   SURGEON:  Larina Earthly, M.D.   ASSISTANT:  Toribio Harbour, R.N.   ANESTHESIA:  General endotracheal.   COMPLICATIONS:  None.   DISPOSITION:  To recovery room, stable.   PROCEDURE IN DETAIL:  The patient was taken to the operating room and placed  in a supine position, where the area of the abdomen was prepped and draped  in the usual sterile fashion.  Both groins were also prepped and draped in  the usual sterile fashion.  Incision was made from the level of the xiphoid  to below the umbilicus and carried down through the midline fascia with  electrocautery.  The abdomen was entered.  The large and small bowel, liver  and stomach were normal.  The Omni-Tract retractor was used for exposure.  The transverse colon and omentum were reflected superiorly and the small  bowel was reflected to the right.  Duodenum was mobilized off the aorta.  The aorta was encircled below the level of the renal arteries.  The aneurysm  extended down to just above the bifurcation of the aorta.  The common iliac  arteries were encircled bilaterally.  The inferior mesenteric artery was  ligated at its takeoff from the aorta.  The patient was given 8000 units of  intravenous heparin and after adequate infiltration time, the aorta was  occluded below the level of the renal arteries and the iliac arteries were  occluded bilaterally.  The aneurysm was  opened with electrocautery.  Mural  thrombus was removed and the lumbar back-bleeders were controlled with 3-0  silk popoff stitches.  The aorta was transected below the level of the  proximal clamp.  A 14-mm Hemashield graft was brought onto the field and was  cut to the appropriate size and was sewn end-to-end to the aorta with a  running 3-0 Prolene suture.  The anastomosis was tested and found to be  adequate.  Next, the aorta was transected just above the iliac bifurcation  and the grafts cut to the appropriate length and sewn end-to-end to the  distal aorta just above the bifurcation of the aorta; this was also done  with a running 3-0 Prolene suture.  After the usual flushing maneuvers were  undertaken, the anastomosis was completed.  Flow was restored  to each leg.  The patient was given 50 mg of protamine to reverse the heparin.  The  aneurysm sac was closed over the graft with a running 2-0 Vicryl suture.  Next, the retroperitoneum was closed with 2-0 running Vicryl suture.  The  small bowel was run in its entirety, found to be without injury was placed  back in the pelvis.  The transverse  omentum was placed over this.  The midline fascia was closed with a #1 PDS  suture, beginning proximally and distally and tying in the middle.  The skin  was closed with 3-0 subcuticular Vicryl stitch.  A sterile dressing was  applied and the patient was taken to the recovery room in stable condition.                                               Larina Earthly, M.D.    TFE/MEDQ  D:  02/27/2002  T:  02/27/2002  Job:  [id number]

## 2010-10-13 LAB — CARDIAC PANEL(CRET KIN+CKTOT+MB+TROPI)
CK, MB: 34.4 — ABNORMAL HIGH
CK, MB: 9.3 — ABNORMAL HIGH
Relative Index: 10.3 — ABNORMAL HIGH
Relative Index: 11.6 — ABNORMAL HIGH
Relative Index: 7 — ABNORMAL HIGH
Total CK: 132
Total CK: 252 — ABNORMAL HIGH
Total CK: 297 — ABNORMAL HIGH
Troponin I: 1.32
Troponin I: 2.97
Troponin I: 3.01

## 2010-10-13 LAB — BASIC METABOLIC PANEL
BUN: 9
CO2: 27
Calcium: 8.8
Chloride: 106
Creatinine, Ser: 0.78
GFR calc Af Amer: >60
GFR calc non Af Amer: >60
Potassium: 3.6
Sodium: 139

## 2010-10-13 LAB — CBC
Hemoglobin: 12.6
MCHC: 34.8
MCV: 90.7
Platelets: 194
RBC: 4

## 2012-06-17 DIAGNOSIS — I10 Essential (primary) hypertension: Secondary | ICD-10-CM | POA: Insufficient documentation

## 2012-06-17 DIAGNOSIS — E785 Hyperlipidemia, unspecified: Secondary | ICD-10-CM | POA: Insufficient documentation

## 2012-06-17 DIAGNOSIS — F341 Dysthymic disorder: Secondary | ICD-10-CM

## 2012-06-17 HISTORY — DX: Essential (primary) hypertension: I10

## 2012-06-17 HISTORY — DX: Dysthymic disorder: F34.1

## 2012-06-17 HISTORY — DX: Hyperlipidemia, unspecified: E78.5

## 2013-10-31 DIAGNOSIS — Z2882 Immunization not carried out because of caregiver refusal: Secondary | ICD-10-CM

## 2013-10-31 DIAGNOSIS — R011 Cardiac murmur, unspecified: Secondary | ICD-10-CM

## 2013-10-31 DIAGNOSIS — Z789 Other specified health status: Secondary | ICD-10-CM | POA: Insufficient documentation

## 2013-10-31 DIAGNOSIS — I25709 Atherosclerosis of coronary artery bypass graft(s), unspecified, with unspecified angina pectoris: Secondary | ICD-10-CM | POA: Insufficient documentation

## 2013-10-31 DIAGNOSIS — I2510 Atherosclerotic heart disease of native coronary artery without angina pectoris: Secondary | ICD-10-CM

## 2013-10-31 HISTORY — DX: Atherosclerosis of coronary artery bypass graft(s), unspecified, with unspecified angina pectoris: I25.709

## 2013-10-31 HISTORY — DX: Other specified health status: Z78.9

## 2013-10-31 HISTORY — DX: Cardiac murmur, unspecified: R01.1

## 2013-10-31 HISTORY — DX: Immunization not carried out because of caregiver refusal: Z28.82

## 2013-11-06 ENCOUNTER — Telehealth: Payer: Self-pay | Admitting: Cardiology

## 2013-11-06 NOTE — Telephone Encounter (Signed)
Received 10 pages of records from Dr Ethelene Hal at [place] for appointment with Dr Percival Spanish on 11/27/13 . Records given to [facility] (medical records) for Dr Cherlyn Cushing schedule on 11/27/13  lp

## 2013-11-20 ENCOUNTER — Encounter (INDEPENDENT_AMBULATORY_CARE_PROVIDER_SITE_OTHER): Payer: Self-pay

## 2013-11-20 ENCOUNTER — Other Ambulatory Visit (INDEPENDENT_AMBULATORY_CARE_PROVIDER_SITE_OTHER): Payer: Self-pay | Admitting: General Surgery

## 2013-11-21 ENCOUNTER — Encounter: Payer: Self-pay | Admitting: *Deleted

## 2013-11-27 ENCOUNTER — Encounter: Payer: Self-pay | Admitting: Cardiology

## 2013-11-27 ENCOUNTER — Ambulatory Visit (INDEPENDENT_AMBULATORY_CARE_PROVIDER_SITE_OTHER): Payer: Medicare Other | Admitting: Cardiology

## 2013-11-27 VITALS — BP 116/82 | HR 88 | Ht 69.0 in | Wt 186.7 lb

## 2013-11-27 DIAGNOSIS — R011 Cardiac murmur, unspecified: Secondary | ICD-10-CM

## 2013-11-27 HISTORY — DX: Cardiac murmur, unspecified: R01.1

## 2013-11-27 NOTE — Progress Notes (Signed)
HPI The patient presents as a new patient. She had distant bypass surgery. She was previously followed by Dr. Rex Kras.  She also saw a cardiologist she thinks a couple of years ago in [facility] and she had a negative stress test.  She presents for preoperative evaluation prior to possible abdominal hernia surgery. She also needed to reestablish cardiology. She has no active cardiovascular complaints. She is very active taking care of his 14-year-old grandson. She vacuumed climbs stairs routinely.   She denies any cardiovascular symptoms.  The patient denies any new symptoms such as chest discomfort, neck or arm discomfort. There has been no new shortness of breath, PND or orthopnea. There have been no reported palpitations, presyncope or syncope.   No Known Allergies  Current Outpatient Prescriptions  Medication Sig Dispense Refill  . Amlodipine-Valsartan-HCTZ 10-320-25 MG TABS Take 1 tablet by mouth daily.    . colesevelam (WELCHOL) 625 MG tablet Take 1,875 mg by mouth 2 (two) times daily.    Marland Kitchen escitalopram (LEXAPRO) 20 MG tablet Take 20 mg by mouth daily.    Marland Kitchen ezetimibe (ZETIA) 10 MG tablet Take 10 mg by mouth daily.    . rosuvastatin (CRESTOR) 40 MG tablet Take 40 mg by mouth daily.     No current facility-administered medications for this visit.    Past Medical History  Diagnosis Date  . HTN (hypertension)   . Hyperlipidemia   . CAD (coronary artery disease)     2009 RCA occluded proximally, LAD occluded proximally, left main 90% stenosis, LIMA to the LAD widely patent, SVG to OM and diagonal patent, SVG RCA patent. This stenosis after the anastomosis of 80% in the native vessel. This was treated with PCI.    Past Surgical History  Procedure Laterality Date  . Coronary artery bypass graft  1994    LIMA to the LAD, SVG to RCA, SVG sequential to OM and diagonal,  . Abdominal aortic aneurysm repair  2003    Family History  Problem Relation Age of Onset  . CAD Father 10    Died  of MI  . Hypertension Brother   . Cancer Sister     Ovarian    History   Social History  . Marital Status: Married    Spouse Name: N/A    Number of Children: 3  . Years of Education: N/A   Occupational History  . Not on file.   Social History Main Topics  . Smoking status: Light Tobacco Smoker    Types: Cigarettes  . Smokeless tobacco: Not on file  . Alcohol Use: No  . Drug Use: No  . Sexual Activity: Not on file   Other Topics Concern  . Not on file   Social History Narrative   Lives with husband and 28 year old.      ROS:   Vaginal spotting.  Otherwise as stated in the HPI and negative for all other systems.  PHYSICAL EXAM BP 116/82 mmHg  Pulse 88  Ht 5\' 9"  (1.753 m)  Wt 186 lb 11.2 oz (84.687 kg)  BMI 27.56 kg/m2  GENERAL:  Well appearing HEENT:  Pupils equal round and reactive, fundi not visualized, oral mucosa unremarkable NECK:  No jugular venous distention, waveform within normal limits, carotid upstroke brisk and symmetric, no bruits, no thyromegaly LYMPHATICS:  No cervical, inguinal adenopathy LUNGS:  Clear to auscultation bilaterally BACK:  No CVA tenderness CHEST:  Well healed sternotomy scar. HEART:  PMI not displaced or sustained,S1 and S2  within normal limits, no S3, no S4, no clicks, no rubs, brief apical systolic murmur nonradiating, no diastolic murmurs ABD:  Flat, positive bowel sounds normal in frequency in pitch, no bruits, no rebound, no guarding, no midline pulsatile mass, no hepatomegaly, no splenomegaly EXT:  2 plus pulses throughout, no edema, no cyanosis no clubbing SKIN:  No rashes no nodules NEURO:  Cranial nerves II through XII grossly intact, motor grossly intact throughout PSYCH:  Cognitively intact, oriented to person place and time  EKG:  Sinus rhythm, rate 88, axis within normal limits, QTC prolonged, early transition in lead V2, diffuse nonspecific T-wave flattening.11/27/2013   ASSESSMENT AND PLAN  CAD:  The patient has  no new symptoms. She is participating in risk reduction.  At this point I'm not planning further cardiovascular testing. However, I will be looking for the most recent stress test. If this is not reason enough and will be testing as below.  PREOP:  I will check to see if I can get a copy of her most recent stress test which she thinks was 2013. It this was indeed low risk normal, given her high functional status she would be at acceptable risk for the planned surgery if that happens within the next months.  DYSLIPIDEMIA:  I reviewed her most recent lipid profile and she is on an excellent regimen.  HTN:  The blood pressure is at target. No change in medications is indicated. We will continue with therapeutic lifestyle changes (TLC).

## 2013-11-27 NOTE — Patient Instructions (Signed)
Your physician wants you to follow-up in: 6 months with Dr. Hochrein.  You will receive a reminder letter in the mail two months in advance. If you don't receive a letter, please call our office to schedule the follow-up appointment.  

## 2013-12-04 ENCOUNTER — Other Ambulatory Visit (HOSPITAL_COMMUNITY): Payer: Self-pay | Admitting: Obstetrics and Gynecology

## 2013-12-04 DIAGNOSIS — R1900 Intra-abdominal and pelvic swelling, mass and lump, unspecified site: Secondary | ICD-10-CM

## 2013-12-04 NOTE — Addendum Note (Signed)
Addended by: Vear Clock on: 12/04/2013 09:13 AM   Modules accepted: Orders

## 2013-12-11 ENCOUNTER — Encounter (HOSPITAL_COMMUNITY): Payer: Self-pay

## 2013-12-11 ENCOUNTER — Ambulatory Visit (HOSPITAL_COMMUNITY)
Admission: RE | Admit: 2013-12-11 | Discharge: 2013-12-11 | Disposition: A | Discharge status: Home / Self Care | Payer: Medicare Other | Source: Ambulatory Visit | Attending: Obstetrics and Gynecology | Admitting: Obstetrics and Gynecology

## 2013-12-11 DIAGNOSIS — K439 Ventral hernia without obstruction or gangrene: Secondary | ICD-10-CM | POA: Diagnosis not present

## 2013-12-11 DIAGNOSIS — R1900 Intra-abdominal and pelvic swelling, mass and lump, unspecified site: Secondary | ICD-10-CM

## 2013-12-11 DIAGNOSIS — K5730 Diverticulosis of large intestine without perforation or abscess without bleeding: Secondary | ICD-10-CM | POA: Insufficient documentation

## 2013-12-11 DIAGNOSIS — N9489 Other specified conditions associated with female genital organs and menstrual cycle: Secondary | ICD-10-CM | POA: Diagnosis not present

## 2013-12-11 DIAGNOSIS — N939 Abnormal uterine and vaginal bleeding, unspecified: Secondary | ICD-10-CM | POA: Insufficient documentation

## 2013-12-11 HISTORY — DX: Systemic involvement of connective tissue, unspecified: M35.9

## 2013-12-11 MED ORDER — IOHEXOL 300 MG/ML  SOLN
100.0000 mL | Freq: Once | INTRAMUSCULAR | Status: AC | PRN
  Administered 2013-12-11: 100 mL via INTRAVENOUS

## 2013-12-27 ENCOUNTER — Telehealth: Payer: Self-pay | Admitting: Cardiology

## 2013-12-27 ENCOUNTER — Encounter: Payer: Self-pay | Admitting: Cardiology

## 2013-12-27 NOTE — Telephone Encounter (Signed)
Pt sill have not heard whether she need a stress test before she is scheduled for surgery.

## 2013-12-27 NOTE — Telephone Encounter (Signed)
Message routed to [facility], LPN and Dr. Percival Spanish to review & advise

## 2014-01-01 NOTE — Telephone Encounter (Signed)
Call and [facility]

## 2014-01-07 ENCOUNTER — Encounter: Payer: Self-pay | Admitting: Gynecology

## 2014-01-07 ENCOUNTER — Ambulatory Visit: Payer: Medicare Other | Attending: Gynecology | Admitting: Gynecology

## 2014-01-07 VITALS — BP 142/90 | HR 95 | Temp 98.5°F | Resp 18 | Ht 69.0 in | Wt 186.1 lb

## 2014-01-07 DIAGNOSIS — R1900 Intra-abdominal and pelvic swelling, mass and lump, unspecified site: Secondary | ICD-10-CM | POA: Insufficient documentation

## 2014-01-07 DIAGNOSIS — E785 Hyperlipidemia, unspecified: Secondary | ICD-10-CM | POA: Insufficient documentation

## 2014-01-07 DIAGNOSIS — Z79899 Other long term (current) drug therapy: Secondary | ICD-10-CM | POA: Insufficient documentation

## 2014-01-07 DIAGNOSIS — N840 Polyp of corpus uteri: Secondary | ICD-10-CM | POA: Insufficient documentation

## 2014-01-07 DIAGNOSIS — Z951 Presence of aortocoronary bypass graft: Secondary | ICD-10-CM | POA: Insufficient documentation

## 2014-01-07 DIAGNOSIS — I10 Essential (primary) hypertension: Secondary | ICD-10-CM | POA: Diagnosis not present

## 2014-01-07 DIAGNOSIS — N9489 Other specified conditions associated with female genital organs and menstrual cycle: Secondary | ICD-10-CM

## 2014-01-07 DIAGNOSIS — Z7982 Long term (current) use of aspirin: Secondary | ICD-10-CM | POA: Diagnosis not present

## 2014-01-07 DIAGNOSIS — I2510 Atherosclerotic heart disease of native coronary artery without angina pectoris: Secondary | ICD-10-CM | POA: Diagnosis not present

## 2014-01-07 DIAGNOSIS — N8329 Other ovarian cysts: Secondary | ICD-10-CM

## 2014-01-07 DIAGNOSIS — I714 Abdominal aortic aneurysm, without rupture: Secondary | ICD-10-CM | POA: Insufficient documentation

## 2014-01-07 DIAGNOSIS — K439 Ventral hernia without obstruction or gangrene: Secondary | ICD-10-CM | POA: Diagnosis not present

## 2014-01-07 DIAGNOSIS — N950 Postmenopausal bleeding: Secondary | ICD-10-CM | POA: Insufficient documentation

## 2014-01-07 DIAGNOSIS — F17210 Nicotine dependence, cigarettes, uncomplicated: Secondary | ICD-10-CM | POA: Insufficient documentation

## 2014-01-07 NOTE — Patient Instructions (Signed)
We will contact you about arranging an upcoming surgery with Dr. Excell Seltzer.  Continue with your cardiac evaluation.  Please call for any questions or concerns.

## 2014-01-07 NOTE — Progress Notes (Signed)
Consult Note: Gyn-Onc   [name] 68 y.o. female  Chief Complaint  Patient presents with  . bilateral cystic and solid adnexal masses    Assessment : Complex bilateral adnexal masses, endometrial polyp. Ventral hernia   Plan: I reviewed the patient's CT scan and other laboratory work including a CA-125. I recommend the patient undergo exploratory laparotomy, bilateral salpingo-oophorectomy and hysterectomy with intraoperative frozen section of the adnexal masses as well as the uterus. I would like to suggest coordinating the surgery with Dr. Excell Seltzer for repair of the hernia.  The patient is scheduled to have a stress test in the very near future in preoperative evaluation. She has recently seen Dr.Hochren for initial cardiac evaluation.  The risks of surgery including hemorrhage infection injury to adjacent viscera thrombolic complications and anesthetic risks were reviewed. All the patient's questions are answered. We will attempt to coordinate surgery with Dr. Excell Seltzer.   HPI: 68 year old white married female seen in consultation request of Dr. Louretta Shorten regarding management of bilateral complex adnexal masses and what appears to be an endometrial polyp with associated postmenopausal bleeding. The patient initially presented with some postmenopausal bleeding and underwent an ultrasound that showed both what appears to be an endometrial polyp as well as complex adnexal masses. The patient subsequent was had a CT scan of the abdomen and pelvis shows no evidence of adenopathy ascites or metastatic disease. CA-125 is 16. The patient denies any past history of gynecologic problems.  Relevant medical history includes a coronary artery bypass procedure 1994 and abdominal aortic aneurysm repair in 2003 and subsequent exploratory laparotomy for an intestinal obstruction 2007. The patient has a ventral hernia at the present time which is relatively asymptomatic. She has seen Dr. Excell Seltzer regarding  repair of the hernia.  Patient has no family history of breast or ovarian cancer.  Review of Systems:10 point review of systems is negative except as noted in interval history.   Vitals: Blood pressure 142/90, pulse 95, temperature 98.5 F (36.9 C), temperature source Oral, resp. rate 18, height 5\' 9"  (1.753 m), weight 186 lb 1.6 oz (84.414 kg).  Physical Exam: General : The patient is a healthy woman in no acute distress.  HEENT: normocephalic, extraoccular movements normal; neck is supple without thyromegally  Lynphnodes: Supraclavicular and inguinal nodes not enlarged  Abdomen: Soft, non-tender, no ascites, no organomegally, no masses, no hernias Are midline incision with associated ventral hernia which is easily reducible. Pelvic:  EGBUS: Normal female  Vagina: Normal, no lesions  Urethra and Bladder: Normal, non-tender  Cervix: Normal Uterus: Normal shape size and consistency Bi-manual examination: Non-tender; no adenxal masses or nodularity I'm unable to appreciate the masses. Rectal: normal sphincter tone, no masses, no blood  Lower extremities: No edema or varicosities. Normal range of motion      No Known Allergies  Past Medical History  Diagnosis Date  . HTN (hypertension)   . Hyperlipidemia   . CAD (coronary artery disease)     2009 RCA occluded proximally, LAD occluded proximally, left main 90% stenosis, LIMA to the LAD widely patent, SVG to OM and diagonal patent, SVG RCA patent. This stenosis after the anastomosis of 80% in the native vessel. This was treated with PCI.  Marland Kitchen Collagen vascular disease     Past Surgical History  Procedure Laterality Date  . Coronary artery bypass graft  1994    LIMA to the LAD, SVG to RCA, SVG sequential to OM and diagonal,  . Abdominal aortic aneurysm repair  2003    Current Outpatient Prescriptions  Medication Sig Dispense Refill  . Amlodipine-Valsartan-HCTZ 10-320-25 MG TABS Take 1 tablet by mouth daily.  3  . aspirin 81 MG  tablet Take 81 mg by mouth daily.    Marland Kitchen BIOTIN PO Take 1 tablet by mouth daily.    . colesevelam (WELCHOL) 625 MG tablet Take 1,875 mg by mouth 2 (two) times daily.    Marland Kitchen escitalopram (LEXAPRO) 20 MG tablet Take 20 mg by mouth daily.  3  . ezetimibe (ZETIA) 10 MG tablet Take 10 mg by mouth daily.    . Omega-3 Fatty Acids (FISH OIL PO) Take 6 capsules by mouth daily.    . rosuvastatin (CRESTOR) 40 MG tablet Take 40 mg by mouth daily.     No current facility-administered medications for this visit.    History   Social History  . Marital Status: Married    Spouse Name: N/A    Number of Children: 3  . Years of Education: N/A   Occupational History  . Not on file.   Social History Main Topics  . Smoking status: Light Tobacco Smoker    Types: Cigarettes  . Smokeless tobacco: Not on file  . Alcohol Use: No  . Drug Use: No  . Sexual Activity: Not on file   Other Topics Concern  . Not on file   Social History Narrative   Lives with husband and 61 year old.      Family History  Problem Relation Age of Onset  . CAD Father 73    Died of MI  . Hypertension Brother   . Cancer Sister     Ovarian      Alvino Chapel, MD 01/07/2014, 1:54 PM

## 2014-01-17 ENCOUNTER — Telehealth: Payer: Self-pay | Admitting: *Deleted

## 2014-01-17 NOTE — Telephone Encounter (Signed)
Patient called to let us know she is still waiting for her old cardiologist to get her records over to Dr. Percival Spanish who has taken over her care. I told patient that if she gets any information from them or once she has an appointment with Dr. Percival Spanish to please give [place] a call so we can proceed with planning and scheduling surgery. Patient agreeable to this.

## 2014-01-25 ENCOUNTER — Telehealth: Payer: Self-pay | Admitting: Cardiology

## 2014-01-25 NOTE — Telephone Encounter (Signed)
Received records from [facility] (Dr Linus Salmons) that patient wanted Dr Percival Spanish to review regarding stress test and surgery.  Records given to Dr Hochrein's nurse 01/25/14 lp

## 2014-01-25 NOTE — Telephone Encounter (Signed)
I was finally able to review the results of her most recent stress test.  This was a negative Lexiscan Myoview.   She reports no new symptoms since then.  She has a high functional level.  Therefore, based on ACC/AHA guidelines, the patient would be at acceptable risk for the planned procedure without further cardiovascular testing.

## 2014-01-28 NOTE — Telephone Encounter (Signed)
Pt. Called and informed that Dr Percival Spanish stated that she was ok to have her procedure

## 2014-02-05 ENCOUNTER — Telehealth: Payer: Self-pay | Admitting: Cardiology

## 2014-02-05 NOTE — Telephone Encounter (Signed)
Please call,she need a note to clear her for surgery. Please fax to [place].

## 2014-02-06 ENCOUNTER — Encounter: Payer: Self-pay | Admitting: *Deleted

## 2014-02-06 NOTE — Telephone Encounter (Signed)
clearance letter sent to GYN office

## 2014-02-08 ENCOUNTER — Telehealth: Payer: Self-pay | Admitting: *Deleted

## 2014-02-08 NOTE — Telephone Encounter (Signed)
Spoke with Jackelyn Poling, surgery scheduler for Dr. Excell Seltzer, in regards to coordinating patient's gyn surgery and hernia repair. She said that Dr. Excell Seltzer is on call at [facility] on 03/26/14 and she thought that day would possibly work for him. Inbox message sent to Dr. Excell Seltzer regarding this. Cardiac clearance faxed to Dr. Lear Ng office attn: Ivor Costa.

## 2014-02-11 ENCOUNTER — Other Ambulatory Visit (INDEPENDENT_AMBULATORY_CARE_PROVIDER_SITE_OTHER): Payer: Self-pay | Admitting: General Surgery

## 2014-02-13 ENCOUNTER — Telehealth: Payer: Self-pay | Admitting: *Deleted

## 2014-02-13 NOTE — Telephone Encounter (Signed)
Notified pt of scheduled appointment for 03/15/2014 @ 11:45. Pt agreed with time and date

## 2014-02-14 NOTE — Telephone Encounter (Signed)
Received confirmation from Dr. Excell Seltzer that he is able to do surgery on 03/26/14. Spoke back with Jackelyn Poling and she needs to know what time Dr. Denman George will start and how long she needs so Dr. Lear Ng portion of the surgery can be scheduled. Will confirm with Dr. Denman George and call Debbie back with necessary information.

## 2014-02-14 NOTE — Telephone Encounter (Signed)
Called back and left voicemail for Debbie that Dr. Denman George with start her portion of the procedure at 7:30am and needs 1.5 hours for her portion. Asked for return call to confirm that Jackelyn Poling got voicemail.

## 2014-03-07 ENCOUNTER — Encounter: Payer: Self-pay | Admitting: Cardiology

## 2014-03-15 ENCOUNTER — Encounter: Payer: Self-pay | Admitting: Gynecologic Oncology

## 2014-03-15 ENCOUNTER — Ambulatory Visit: Payer: Medicare Other | Attending: Gynecologic Oncology | Admitting: Gynecologic Oncology

## 2014-03-15 VITALS — BP 132/80 | HR 94 | Temp 97.8°F | Resp 22 | Ht 69.0 in | Wt 186.6 lb

## 2014-03-15 DIAGNOSIS — N950 Postmenopausal bleeding: Secondary | ICD-10-CM | POA: Insufficient documentation

## 2014-03-15 DIAGNOSIS — F17210 Nicotine dependence, cigarettes, uncomplicated: Secondary | ICD-10-CM | POA: Insufficient documentation

## 2014-03-15 DIAGNOSIS — K439 Ventral hernia without obstruction or gangrene: Secondary | ICD-10-CM | POA: Insufficient documentation

## 2014-03-15 DIAGNOSIS — I10 Essential (primary) hypertension: Secondary | ICD-10-CM | POA: Diagnosis not present

## 2014-03-15 DIAGNOSIS — E785 Hyperlipidemia, unspecified: Secondary | ICD-10-CM | POA: Diagnosis not present

## 2014-03-15 DIAGNOSIS — Z951 Presence of aortocoronary bypass graft: Secondary | ICD-10-CM | POA: Diagnosis not present

## 2014-03-15 DIAGNOSIS — Z9889 Other specified postprocedural states: Secondary | ICD-10-CM | POA: Diagnosis not present

## 2014-03-15 DIAGNOSIS — Z7982 Long term (current) use of aspirin: Secondary | ICD-10-CM | POA: Insufficient documentation

## 2014-03-15 DIAGNOSIS — N8329 Other ovarian cysts: Secondary | ICD-10-CM

## 2014-03-15 DIAGNOSIS — Z8679 Personal history of other diseases of the circulatory system: Secondary | ICD-10-CM | POA: Diagnosis not present

## 2014-03-15 DIAGNOSIS — N840 Polyp of corpus uteri: Secondary | ICD-10-CM | POA: Diagnosis present

## 2014-03-15 DIAGNOSIS — I2510 Atherosclerotic heart disease of native coronary artery without angina pectoris: Secondary | ICD-10-CM | POA: Insufficient documentation

## 2014-03-15 DIAGNOSIS — N9489 Other specified conditions associated with female genital organs and menstrual cycle: Secondary | ICD-10-CM

## 2014-03-15 NOTE — Progress Notes (Signed)
Preop Consult Note: Gyn-Onc   Lilli Few 69 y.o. female  Chief Complaint  Patient presents with  . bilateral cystic and solid adnexal masses    Assessment : Complex bilateral adnexal masses, endometrial polyp. Ventral hernia   Plan: I reviewed the patient's CT scan and personally viewed the images and other laboratory work including a CA-125. I recommend the patient undergo exploratory laparotomy, bilateral salpingo-oophorectomy and hysterectomy with intraoperative frozen section of the adnexal masses as well as the uterus. We are coordinating the surgery with Dr. Excell Seltzer for repair of the hernia.  She has recently seen Dr.Hochren for initial cardiac evaluation. He has provided [place] with written risk stratification for the surgery describing it as low risk from a cardiac standpoint and it is okay to proceed without further cardiac testing. She normally takes aspirin daily for her history of coronary artery disease and bypass surgery however the patient admitted to me she has not been taking this recently. I'm recommending she continue to abstain preoperatively and resume taking aspirin postoperatively.  The risks of surgery including hemorrhage infection injury to adjacent viscera thrombolic complications and anesthetic risks were reviewed. 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 I explained to the patient that I will be leaving town proximal 3 days after her surgery and offered her an alternative date of surgery however she is electing to proceed at the current surgical date. She understands that during my absence I will have coverage for her care by my partners. All the patient's questions are  answered.    HPI: 69 year old white married female seen in consultation request of Dr. Louretta Shorten regarding management of bilateral complex adnexal masses and what appears to be an endometrial polyp with associated postmenopausal bleeding. The patient initially presented with some postmenopausal bleeding and underwent an ultrasound that showed both what appears to be an endometrial polyp as well as complex adnexal masses. The patient subsequent was had a CT scan of the abdomen and pelvis shows no evidence of adenopathy ascites or metastatic disease. CA-125 is 16. The patient denies any past history of gynecologic problems.  Relevant medical history includes a coronary artery bypass procedure 1994 and abdominal aortic aneurysm repair in 2003 and subsequent exploratory laparotomy for an intestinal obstruction 2007. The patient has a ventral hernia at the present time which is relatively asymptomatic. She has seen Dr. Excell Seltzer regarding repair of the hernia.  Patient has no family history of breast or ovarian cancer.  Review of Systems:10 point review of systems is negative except as noted in interval history.   Vitals: Blood pressure 132/80, pulse 94, temperature 97.8 F (36.6 C), temperature source Oral, resp. rate 22, height 5\' 9"  (1.753 m), weight 186 lb 9.6 oz (84.641 kg).  Physical Exam: General : The patient is a healthy woman in no acute distress.  HEENT: normocephalic, extraoccular movements normal; neck is supple without thyromegally  Lynphnodes: Supraclavicular and inguinal nodes not enlarged  Abdomen: Soft, non-tender, no ascites, no organomegally, no masses, no hernias Are midline incision with associated ventral hernia which is easily reducible. Pelvic:  EGBUS: Normal female  Vagina: Normal, no lesions  Urethra and Bladder: Normal, non-tender  Cervix: Normal Uterus: Normal shape size and consistency  Bi-manual examination: Non-tender; no adenxal masses or nodularity I'm unable to  appreciate the masses. Rectal: normal sphincter tone, no masses, no blood. No nodularity Lower extremities: No edema or varicosities. Normal range of motion      No Known Allergies  Past Medical History  Diagnosis Date  . HTN (hypertension)   . Hyperlipidemia   . CAD (coronary artery disease)     2009 RCA occluded proximally, LAD occluded proximally, left main 90% stenosis, LIMA to the LAD widely patent, SVG to OM and diagonal patent, SVG RCA patent. This stenosis after the anastomosis of 80% in the native vessel. This was treated with PCI.  Marland Kitchen Collagen vascular disease     Past Surgical History  Procedure Laterality Date  . Coronary artery bypass graft  1994    LIMA to the LAD, SVG to RCA, SVG sequential to OM and diagonal,  . Abdominal aortic aneurysm repair  2003    Current Outpatient Prescriptions  Medication Sig Dispense Refill  . Amlodipine-Valsartan-HCTZ 10-320-25 MG TABS Take 1 tablet by mouth daily.  3  . aspirin 81 MG tablet Take 81 mg by mouth daily.    Marland Kitchen BIOTIN PO Take 1 tablet by mouth daily.    . colesevelam (WELCHOL) 625 MG tablet Take 1,875 mg by mouth 2 (two) times daily.    Marland Kitchen escitalopram (LEXAPRO) 20 MG tablet Take 20 mg by mouth daily.  3  . ezetimibe (ZETIA) 10 MG tablet Take 10 mg by mouth daily.    . Omega-3 Fatty Acids (FISH OIL PO) Take 6 capsules by mouth daily.    . rosuvastatin (CRESTOR) 40 MG tablet Take 40 mg by mouth daily.     No current facility-administered medications for this visit.    History   Social History  . Marital Status: Married    Spouse Name: N/A  . Number of Children: 3  . Years of Education: N/A   Occupational History  . Not on file.   Social History Main Topics  . Smoking status: Light Tobacco Smoker    Types: Cigarettes  . Smokeless tobacco: Not on file  . Alcohol Use: No  . Drug Use: No  . Sexual Activity: Not Currently   Other Topics Concern  . Not on file   Social History Narrative   Lives with husband  and 34 year old.      Family History  Problem Relation Age of Onset  . CAD Father 60    Died of MI  . Hypertension Brother   . Cancer Sister     Ovarian      Donaciano Eva, MD 03/15/2014, 12:20 PM

## 2014-03-15 NOTE — Patient Instructions (Signed)
Preparing for your Surgery  Plan for surgery on March 15 with Dr. Denman George and Dr. Excell Seltzer.  Pre-operative Testing -You will receive a phone call from presurgical testing at [facility] to arrange for a pre-operative testing appointment before your surgery.  This appointment normally occurs one to two weeks before your scheduled surgery.   -Bring your insurance card, copy of an advanced directive if applicable, medication list  -At that visit, you will be asked to sign a consent for a possible blood transfusion in case a transfusion becomes necessary during surgery.  The need for a blood transfusion is rare but having consent is a necessary part of your care.     -You should not be taking blood thinners or aspirin at least ten days prior to surgery.  Day Before Surgery at [place] will be asked to take in only clear liquids the day before surgery.  Examples of clear liquids include broths, jello, and clear juices. You will be advised to have nothing to eat or drink after midnight the evening before.    Your role in recovery Your role is to become active as soon as directed by your doctor, while still giving yourself time to heal.  Rest when you feel tired. You will be asked to do the following in order to speed your recovery:  - Cough and breathe deeply. This helps toclear and expand your lungs and can prevent pneumonia. You may be given a spirometer to practice deep breathing. A staff member will show you how to use the spirometer. - Do mild physical activity. Walking or moving your legs help your circulation and body functions return to normal. A staff member will help you when you try to walk and will provide you with simple exercises. Do not try to get up or walk alone the first time. - Actively manage your pain. Managing your pain lets you move in comfort. We will ask you to rate your pain on a scale of zero to 10. It is your responsibility to tell your doctor or nurse  where and how much you hurt so your pain can be treated.  Special Considerations -If you are diabetic, you may be placed on insulin after surgery to have closer control over your blood sugars to promote healing and recovery.  This does not mean that you will be discharged on insulin.  If applicable, your oral antidiabetics will be resumed when you are tolerating a solid diet.  -Your final pathology results from surgery should be available by the Friday after surgery and the results will be relayed to you when available.  Blood Transfusion Information WHAT IS A BLOOD TRANSFUSION? A transfusion is the replacement of blood or some of its parts. Blood is made up of multiple cells which provide different functions.  Red blood cells carry oxygen and are used for blood loss replacement.  White blood cells fight against infection.  Platelets control bleeding.  Plasma helps clot blood.  Other blood products are available for specialized needs, such as hemophilia or other clotting disorders. BEFORE THE TRANSFUSION  Who gives blood for transfusions?   You may be able to donate blood to be used at a later date on yourself (autologous donation).  Relatives can be asked to donate blood. This is generally not any safer than if you have received blood from a stranger. The same precautions are taken to ensure safety when a relative's blood is donated.  Healthy volunteers who are fully evaluated to make  sure their blood is safe. This is blood bank blood. Transfusion therapy is the safest it has ever been in the practice of medicine. Before blood is taken from a donor, a complete history is taken to make sure that person has no history of diseases nor engages in risky social behavior (examples are intravenous drug use or sexual activity with multiple partners). The donor's travel history is screened to minimize risk of transmitting infections, such as malaria. The donated blood is tested for signs of  infectious diseases, such as HIV and hepatitis. The blood is then tested to be sure it is compatible with you in order to minimize the chance of a transfusion reaction. If you or a relative donates blood, this is often done in anticipation of surgery and is not appropriate for emergency situations. It takes many days to process the donated blood. RISKS AND COMPLICATIONS Although transfusion therapy is very safe and saves many lives, the main dangers of transfusion include:   Getting an infectious disease.  Developing a transfusion reaction. This is an allergic reaction to something in the blood you were given. Every precaution is taken to prevent this. The decision to have a blood transfusion has been considered carefully by your caregiver before blood is given. Blood is not given unless the benefits outweigh the risks.

## 2014-03-18 NOTE — Patient Instructions (Addendum)
JORDIE SKALSKY  03/18/2014   Your procedure is scheduled on: Tuesday 03/26/2014  Report to [facility] Main  Entrance and follow signs to               [place] [facility] [place].  Call this number if you have problems the morning of surgery [phone]   Remember: [place] DIET ON Monday 03/25/2014 !   Do not eat food or clear liquids :After Midnight.      Take these medicines the morning of surgery with A SIP OF WATER: Lexapro                               You may not have any metal on your body including hair pins and              piercings  Do not wear jewelry, make-up, lotions, powders or perfumes.             Do not wear nail polish.  Do not shave  48 hours prior to surgery.              Men may shave face and neck.   Do not bring valuables to the hospital. [place].  Contacts, dentures or bridgework may not be worn into surgery.  Leave suitcase in the car. After surgery it may be brought to your room.     Patients discharged the day of surgery will not be allowed to drive home.  Name and phone number of your driver:  Special Instructions: N/A              Please read over the following fact sheets you were given: _____________________________________________________________________             [facility] - Preparing for Surgery Before surgery, you can play an important role.  Because skin is not sterile, your skin needs to be as free of germs as possible.  You can reduce the number of germs on your skin by washing with CHG (chlorahexidine gluconate) soap before surgery.  CHG is an antiseptic cleaner which kills germs and bonds with the skin to continue killing germs even after washing. Please DO NOT use if you have an allergy to CHG or antibacterial soaps.  If your skin becomes reddened/irritated stop using the CHG and inform your nurse when you arrive at Short Stay. Do not shave  (including legs and underarms) for at least 48 hours prior to the first CHG shower.  You may shave your face/neck. Please follow these instructions carefully:  1.  Shower with CHG Soap the night before surgery and the  morning of Surgery.  2.  If you choose to wash your hair, wash your hair first as usual with your  normal  shampoo.  3.  After you shampoo, rinse your hair and body thoroughly to remove the  shampoo.                           4.  Use CHG as you would any other liquid soap.  You can apply chg directly  to the skin and wash  Gently with a scrungie or clean washcloth.  5.  Apply the CHG Soap to your body ONLY FROM THE NECK DOWN.   Do not use on face/ open                           Wound or open sores. Avoid contact with eyes, ears mouth and genitals (private parts).                       Wash face,  Genitals (private parts) with your normal soap.             6.  Wash thoroughly, paying special attention to the area where your surgery  will be performed.  7.  Thoroughly rinse your body with warm water from the neck down.  8.  DO NOT shower/wash with your normal soap after using and rinsing off  the CHG Soap.                9.  Pat yourself dry with a clean towel.            10.  Wear clean pajamas.            11.  Place clean sheets on your bed the night of your first shower and do not  sleep with pets. Day of Surgery : Do not apply any lotions/deodorants the morning of surgery.  Please wear clean clothes to the hospital/surgery center.  FAILURE TO FOLLOW THESE INSTRUCTIONS MAY RESULT IN THE CANCELLATION OF YOUR SURGERY PATIENT SIGNATURE_________________________________  NURSE SIGNATURE__________________________________  ________________________________________________________________________   Adam Phenix  An incentive spirometer is a tool that can help keep your lungs clear and active. This tool measures how well you are filling your lungs with  each breath. Taking long deep breaths may help reverse or decrease the chance of developing breathing (pulmonary) problems (especially infection) following:  A long period of time when you are unable to move or be active. BEFORE THE PROCEDURE   If the spirometer includes an indicator to show your best effort, your nurse or respiratory therapist will set it to a desired goal.  If possible, sit up straight or lean slightly forward. Try not to slouch.  Hold the incentive spirometer in an upright position. INSTRUCTIONS FOR USE   Sit on the edge of your bed if possible, or sit up as far as you can in bed or on a chair.  Hold the incentive spirometer in an upright position.  Breathe out normally.  Place the mouthpiece in your mouth and seal your lips tightly around it.  Breathe in slowly and as deeply as possible, raising the piston or the ball toward the top of the column.  Hold your breath for 3-5 seconds or for as long as possible. Allow the piston or ball to fall to the bottom of the column.  Remove the mouthpiece from your mouth and breathe out normally.  Rest for a few seconds and repeat Steps 1 through 7 at least 10 times every 1-2 hours when you are awake. Take your time and take a few normal breaths between deep breaths.  The spirometer may include an indicator to show your best effort. Use the indicator as a goal to work toward during each repetition.  After each set of 10 deep breaths, practice coughing to be sure your lungs are clear. If you have an incision (the cut made at the time of surgery),  support your incision when coughing by placing a pillow or rolled up towels firmly against it. Once you are able to get out of bed, walk around indoors and cough well. You may stop using the incentive spirometer when instructed by your caregiver.  RISKS AND COMPLICATIONS  Take your time so you do not get dizzy or light-headed.  If you are in pain, you may need to take or ask for  pain medication before doing incentive spirometry. It is harder to take a deep breath if you are having pain. AFTER USE  Rest and breathe slowly and easily.  It can be helpful to keep track of a log of your progress. Your caregiver can provide you with a simple table to help with this. If you are using the spirometer at home, follow these instructions: [place] IF:   You are having difficultly using the spirometer.  You have trouble using the spirometer as often as instructed.  Your pain medication is not giving enough relief while using the spirometer.  You develop fever of 100.5 F (38.1 C) or higher. SEEK IMMEDIATE MEDICAL CARE IF:   You cough up bloody sputum that had not been present before.  You develop fever of 102 F (38.9 C) or greater.  You develop worsening pain at or near the incision site. MAKE SURE YOU:   Understand these instructions.  Will watch your condition.  Will get help right away if you are not doing well or get worse. Document Released: 05/10/2006 Document Revised: 03/22/2011 Document Reviewed: 07/11/2006 ExitCare Patient Information 2014 ExitCare, [place].   ________________________________________________________________________  WHAT IS A BLOOD TRANSFUSION? Blood Transfusion Information  A transfusion is the replacement of blood or some of its parts. Blood is made up of multiple cells which provide different functions.  Red blood cells carry oxygen and are used for blood loss replacement.  White blood cells fight against infection.  Platelets control bleeding.  Plasma helps clot blood.  Other blood products are available for specialized needs, such as hemophilia or other clotting disorders. BEFORE THE TRANSFUSION  Who gives blood for transfusions?   Healthy volunteers who are fully evaluated to make sure their blood is safe. This is blood bank blood. Transfusion therapy is the safest it has ever been in the practice of medicine.  Before blood is taken from a donor, a complete history is taken to make sure that person has no history of diseases nor engages in risky social behavior (examples are intravenous drug use or sexual activity with multiple partners). The donor's travel history is screened to minimize risk of transmitting infections, such as malaria. The donated blood is tested for signs of infectious diseases, such as HIV and hepatitis. The blood is then tested to be sure it is compatible with you in order to minimize the chance of a transfusion reaction. If you or a relative donates blood, this is often done in anticipation of surgery and is not appropriate for emergency situations. It takes many days to process the donated blood. RISKS AND COMPLICATIONS Although transfusion therapy is very safe and saves many lives, the main dangers of transfusion include:   Getting an infectious disease.  Developing a transfusion reaction. This is an allergic reaction to something in the blood you were given. Every precaution is taken to prevent this. The decision to have a blood transfusion has been considered carefully by your caregiver before blood is given. Blood is not given unless the benefits outweigh the risks. AFTER THE TRANSFUSION  Right after receiving a blood transfusion, you will usually feel much better and more energetic. This is especially true if your red blood cells have gotten low (anemic). The transfusion raises the level of the red blood cells which carry oxygen, and this usually causes an energy increase.  The nurse administering the transfusion will monitor you carefully for complications. HOME CARE INSTRUCTIONS  No special instructions are needed after a transfusion. You may find your energy is better. Speak with your caregiver about any limitations on activity for underlying diseases you may have. SEEK MEDICAL CARE IF:   Your condition is not improving after your transfusion.  You develop redness or  irritation at the intravenous (IV) site. SEEK IMMEDIATE MEDICAL CARE IF:  Any of the following symptoms occur over the next 12 hours:  Shaking chills.  You have a temperature by mouth above 102 F (38.9 C), not controlled by medicine.  Chest, back, or muscle pain.  People around you feel you are not acting correctly or are confused.  Shortness of breath or difficulty breathing.  Dizziness and fainting.  You get a rash or develop hives.  You have a decrease in urine output.  Your urine turns a dark color or changes to pink, red, or brown. Any of the following symptoms occur over the next 10 days:  You have a temperature by mouth above 102 F (38.9 C), not controlled by medicine.  Shortness of breath.  Weakness after normal activity.  The white part of the eye turns yellow (jaundice).  You have a decrease in the amount of urine or are urinating less often.  Your urine turns a dark color or changes to pink, red, or brown. Document Released: 12/26/1999 Document Revised: 03/22/2011 Document Reviewed: 08/14/2007 ExitCare Patient Information 2014 ExitCare, [place].  _______________________________________________________________________   CLEAR LIQUID DIET   Foods Allowed                                                                     Foods Excluded  Coffee and tea, regular and decaf                             liquids that you cannot  Plain Jell-O in any flavor                                             see through such as: Fruit ices (not with fruit pulp)                                     milk, soups, orange juice  Iced Popsicles                                    All solid food Carbonated beverages, regular and diet  Cranberry, grape and apple juices Sports drinks like Gatorade Lightly seasoned clear broth or consume(fat free) Sugar, honey syrup  Sample Menu Breakfast                                Lunch                                      Supper Cranberry juice                    Beef broth                            Chicken broth Jell-O                                     Grape juice                           Apple juice Coffee or tea                        Jell-O                                      Popsicle                                                Coffee or tea                        Coffee or tea  _____________________________________________________________________

## 2014-03-20 ENCOUNTER — Encounter (HOSPITAL_COMMUNITY): Payer: Self-pay

## 2014-03-20 ENCOUNTER — Encounter (HOSPITAL_COMMUNITY)
Admission: RE | Admit: 2014-03-20 | Discharge: 2014-03-20 | Disposition: A | Discharge status: Home / Self Care | Payer: Medicare Other | Source: Ambulatory Visit | Attending: General Surgery | Admitting: General Surgery

## 2014-03-20 DIAGNOSIS — Z01818 Encounter for other preprocedural examination: Secondary | ICD-10-CM | POA: Diagnosis present

## 2014-03-20 DIAGNOSIS — K432 Incisional hernia without obstruction or gangrene: Secondary | ICD-10-CM | POA: Insufficient documentation

## 2014-03-20 LAB — CBC WITH DIFFERENTIAL/PLATELET
BASOS PCT: 0 % (ref 0–1)
Basophils Absolute: 0 10*3/uL (ref 0.0–0.1)
EOS PCT: 1 % (ref 0–5)
Eosinophils Absolute: 0.1 10*3/uL (ref 0.0–0.7)
HCT: 38.5 % (ref 36.0–46.0)
Hemoglobin: 12.8 g/dL (ref 12.0–15.0)
LYMPHS ABS: 2 10*3/uL (ref 0.7–4.0)
Lymphocytes Relative: 29 % (ref 12–46)
MCH: 29.4 pg (ref 26.0–34.0)
MCHC: 33.2 g/dL (ref 30.0–36.0)
MCV: 88.5 fL (ref 78.0–100.0)
Monocytes Absolute: 0.4 10*3/uL (ref 0.1–1.0)
Monocytes Relative: 6 % (ref 3–12)
NEUTROS ABS: 4.4 10*3/uL (ref 1.7–7.7)
Neutrophils Relative %: 64 % (ref 43–77)
Platelets: 194 10*3/uL (ref 150–400)
RBC: 4.35 MIL/uL (ref 3.87–5.11)
RDW: 13.2 % (ref 11.5–15.5)
WBC: 6.9 10*3/uL (ref 4.0–10.5)

## 2014-03-20 LAB — COMPREHENSIVE METABOLIC PANEL
ALT: 34 U/L (ref 0–35)
ANION GAP: 6 (ref 5–15)
AST: 32 U/L (ref 0–37)
Albumin: 4 g/dL (ref 3.5–5.2)
Alkaline Phosphatase: 53 U/L (ref 39–117)
BUN: 13 mg/dL (ref 6–23)
CALCIUM: 9.4 mg/dL (ref 8.4–10.5)
CHLORIDE: 110 mmol/L (ref 96–112)
CO2: 26 mmol/L (ref 19–32)
CREATININE: 0.93 mg/dL (ref 0.50–1.10)
GFR calc Af Amer: 72 mL/min — ABNORMAL LOW (ref >90)
GFR calc non Af Amer: 62 mL/min — ABNORMAL LOW (ref >90)
GLUCOSE: 118 mg/dL — AB (ref 70–99)
Potassium: 3.7 mmol/L (ref 3.5–5.1)
SODIUM: 142 mmol/L (ref 135–145)
Total Bilirubin: 0.5 mg/dL (ref 0.3–1.2)
Total Protein: 7.1 g/dL (ref 6.0–8.3)

## 2014-03-20 LAB — URINE MICROSCOPIC-ADD ON

## 2014-03-20 LAB — URINALYSIS, ROUTINE W REFLEX MICROSCOPIC
Bilirubin Urine: NEGATIVE
Glucose, UA: NEGATIVE mg/dL
Ketones, ur: NEGATIVE mg/dL
Leukocytes, UA: NEGATIVE
Nitrite: NEGATIVE
PROTEIN: NEGATIVE mg/dL
Specific Gravity, Urine: 1.018 (ref 1.005–1.030)
Urobilinogen, UA: 0.2 mg/dL (ref 0.0–1.0)
pH: 5 (ref 5.0–8.0)

## 2014-03-25 NOTE — Anesthesia Preprocedure Evaluation (Addendum)
Anesthesia Evaluation  Patient identified by MRN, date of birth, ID band Patient awake    Reviewed: Allergy & Precautions, NPO status , Patient's Chart, lab work & pertinent test results  History of Anesthesia Complications Negative for: history of anesthetic complications  Airway Mallampati: II  TM Distance: >3 FB Neck ROM: Full    Dental  (+) Edentulous Upper, Poor Dentition, Dental Advisory Given   Pulmonary Current Smoker,    Pulmonary exam normal       Cardiovascular hypertension, + CAD and + CABG     Neuro/Psych PSYCHIATRIC DISORDERS Depression negative neurological ROS     GI/Hepatic negative GI ROS, Neg liver ROS,   Endo/Other  negative endocrine ROS  Renal/GU negative Renal ROS     Musculoskeletal   Abdominal   Peds  Hematology   Anesthesia Other Findings   Reproductive/Obstetrics                            Anesthesia Physical Anesthesia Plan  ASA: III  Anesthesia Plan: General   Post-op Pain Management:    Induction: Intravenous  Airway Management Planned: Oral ETT  Additional Equipment:   Intra-op Plan:   Post-operative Plan: Extubation in OR  Informed Consent: I have reviewed the patients History and Physical, chart, labs and discussed the procedure including the risks, benefits and alternatives for the proposed anesthesia with the patient or authorized representative who has indicated his/her understanding and acceptance.   Dental advisory given  Plan Discussed with: CRNA, Anesthesiologist and Surgeon  Anesthesia Plan Comments:        Anesthesia Quick Evaluation

## 2014-03-25 NOTE — H&P (Signed)
History of Present Illness [name]; 11/20/2013 1:46 PM) Patient words: Evaluate ventral hernia.  The patient is a 69 year old female who presents with an incisional hernia. Patient is known to me from previous surgery. She has a history of aortobifemoral bypass in 2003. I operated on her in 2005 with a laparotomy and lysis of adhesions and small bowel stricturoplasty. She states she has had a hernia ever since her aortobifemoral bypass but my operative note does not indicate findings of a hernia in 2005. She has noted a gradually increasing bulge in her abdomen over the last several years. It has become uncomfortable and she has some intermittent sharp pain when she is bending or stooping that radiates up under her right rib cage. No nausea or vomiting or change in her bowel habits.   Other Problems [name], [place]; 11/20/2013 1:31 PM) Back Pain Depression Gastroesophageal Reflux Disease Heart murmur High blood pressure Hypercholesterolemia Other disease, cancer, significant illness Ventral Hernia Repair  Past Surgical History [name], [place]; 11/20/2013 1:31 PM) Aneurysm Repair Coronary Artery Bypass Graft Tonsillectomy  Diagnostic Studies History [name], [place]; 11/20/2013 1:31 PM) Colonoscopy >10 years ago Pap Smear >5 years ago  Allergies [name], [place]; 11/20/2013 1:32 PM) No Known Drug Allergies11/10/2013  Medication History [name], [place]; 11/20/2013 1:33 PM) Amlodipine-Valsartan-HCTZ (10-320-25MG  Tablet, Oral) Active. Crestor (40MG  Tablet, Oral) Active. Escitalopram Oxalate (20MG  Tablet, Oral) Active. FLUoxetine HCl (20MG  Capsule, Oral) Active. Welchol (625MG  Tablet, Oral) Active. Zetia (10MG  Tablet, Oral) Active. Medications Reconciled  Social History [name], [place]; 11/20/2013 1:31 PM) Alcohol use Moderate alcohol use. No drug use Tobacco use Current some day smoker.  Family History [name], [place]; 11/20/2013 1:31 PM) Depression Mother. Heart Disease Father. Heart disease in female family member before age 36 Hypertension Father. Ovarian Cancer Sister.  Pregnancy / Birth History [name], [place]; 11/20/2013 1:31 PM) Age at menarche 80 years. Age of menopause 101-60 Gravida 3 Maternal age 66-20 Para 3  Review of Systems [name] [place]; 11/20/2013 1:31 PM) General Present- Fatigue and Weight Gain. Not Present- Appetite Loss, Chills, Fever, Night Sweats and Weight Loss. Skin Not Present- Change in Wart/Mole, Dryness, Hives, Jaundice, New Lesions, Non-Healing Wounds, Rash and Ulcer. HEENT Present- Ringing in the Ears and Wears glasses/contact lenses. Not Present- Earache, Hearing Loss, Hoarseness, Nose Bleed, Oral Ulcers, Seasonal Allergies, Sinus Pain, Sore Throat, Visual Disturbances and Yellow Eyes. Respiratory Not Present- Bloody sputum, Chronic Cough, Difficulty Breathing, Snoring and Wheezing. Breast Not Present- Breast Mass, Breast Pain, Nipple Discharge and Skin Changes. Cardiovascular Not Present- Chest Pain, Difficulty Breathing Lying Down, Leg Cramps, Palpitations, Rapid Heart Rate, Shortness of Breath and Swelling of Extremities. Gastrointestinal Present- Abdominal Pain and Excessive gas. Not Present- Bloating, Bloody Stool, Change in Bowel Habits, Chronic diarrhea, Constipation, Difficulty Swallowing, Gets full quickly at meals, Hemorrhoids, Indigestion, Nausea, Rectal Pain and Vomiting. Female Genitourinary Not Present- Frequency, Nocturia, Painful Urination, Pelvic Pain and Urgency. Musculoskeletal Present- Back Pain. Not Present- Joint Pain, Joint Stiffness, Muscle Pain, Muscle Weakness and Swelling of Extremities. Neurological Present- Headaches. Not Present- Decreased Memory, Fainting, Numbness, Seizures, Tingling, Tremor, Trouble walking and Weakness. Psychiatric Present- Depression. Not Present- Anxiety, Bipolar, Change in Sleep Pattern, Fearful  and Frequent crying. Endocrine Not Present- Cold Intolerance, Excessive Hunger, Hair Changes, Heat Intolerance, Hot flashes and New Diabetes. Hematology Not Present- Easy Bruising, Excessive bleeding, Gland problems, HIV and Persistent Infections.   Vitals Allison Costa MA; 11/20/2013 1:31 PM) 11/20/2013 1:31 PM Weight: 187.8 lb Height:  69in Body Surface Area: 2.04 m Body Mass Index: 27.73 kg/m Temp.: 97.57F(Temporal)  Pulse: 71 (Regular)  Resp.: 16 (Unlabored)  BP: 118/74 (Sitting, Left Arm, Standard)    Physical Exam Allison Kitchen T. Anyi Fels Blanchard; 11/20/2013 1:51 PM) The physical exam findings are as follows: Note:General: Alert, well-developed and well nourished Caucasian female , in no distress Skin: Warm and dry without rash or infection. HEENT: No palpable masses or thyromegaly. Sclera nonicteric. Pupils equal round and reactive. Oropharynx clear. Lymph nodes: No cervical, supraclavicular, or inguinal nodes palpable. Lungs: Breath sounds clear and equal. No wheezing or increased work of breathing. Cardiovascular: Regular rate and rhythm without murmer. No JVD or edema. Peripheral pulses intact. No carotid bruits. Abdomen: Nondistended. Soft and nontender. No masses palpable. No organomegaly. there is a moderate sized reducible epigastric incisional hernia which feels to be coming through a defect of about 5 or 6 cm. The remainder of her incision feels intact. Extremities: No edema or joint swelling or deformity. No chronic venous stasis changes. Neurologic: Alert and fully oriented. Gait normal. No focal weakness. Psychiatric: Normal mood and affect. Thought content appropriate with normal judgement and insight    Assessment & Plan Allison Kitchen T. Lizette Pazos Blanchard; 11/20/2013 2:01 PM) INCISIONAL HERNIA (553.21  K43.2) Impression: This is enlarging and symptomatic and I believe should be repaired. I believe she would be a good candidate for laparoscopic repair. We discussed  laparoscopic and possible open incisional hernia repair including use of mesh, risks of general anesthesia, bleeding, infection, intestinal injury and recurrence. She was given literature regarding the surgery and all her questions were answered.  We will need to obtain cardiac clearance prior to proceeding with surgery  I would like to have her uterine abnormality completely sorted out prior to surgery.  Once these items are taking care of we will proceed with scheduling. Current Plans  Referred to Cardiology, for evaluation and follow up (Cardiology). Schedule for Surgery

## 2014-03-26 ENCOUNTER — Inpatient Hospital Stay (HOSPITAL_COMMUNITY)
Admission: RE | Admit: 2014-03-26 | Discharge: 2014-04-05 | DRG: 336 | Disposition: A | Discharge status: Home / Self Care | Payer: Medicare Other | Source: Ambulatory Visit | Attending: Obstetrics & Gynecology | Admitting: Obstetrics & Gynecology

## 2014-03-26 ENCOUNTER — Inpatient Hospital Stay (HOSPITAL_COMMUNITY): Payer: Medicare Other | Admitting: Anesthesiology

## 2014-03-26 ENCOUNTER — Encounter (HOSPITAL_COMMUNITY): Payer: Self-pay | Admitting: *Deleted

## 2014-03-26 ENCOUNTER — Encounter (HOSPITAL_COMMUNITY)
Admission: RE | Disposition: A | Discharge status: Home / Self Care | Payer: Self-pay | Source: Ambulatory Visit | Attending: Obstetrics & Gynecology

## 2014-03-26 DIAGNOSIS — Z8041 Family history of malignant neoplasm of ovary: Secondary | ICD-10-CM | POA: Diagnosis not present

## 2014-03-26 DIAGNOSIS — E861 Hypovolemia: Secondary | ICD-10-CM | POA: Diagnosis not present

## 2014-03-26 DIAGNOSIS — J9811 Atelectasis: Secondary | ICD-10-CM | POA: Diagnosis not present

## 2014-03-26 DIAGNOSIS — I10 Essential (primary) hypertension: Secondary | ICD-10-CM | POA: Diagnosis present

## 2014-03-26 DIAGNOSIS — N8320 Unspecified ovarian cysts: Secondary | ICD-10-CM

## 2014-03-26 DIAGNOSIS — D270 Benign neoplasm of right ovary: Secondary | ICD-10-CM | POA: Diagnosis present

## 2014-03-26 DIAGNOSIS — Z955 Presence of coronary angioplasty implant and graft: Secondary | ICD-10-CM

## 2014-03-26 DIAGNOSIS — Z8249 Family history of ischemic heart disease and other diseases of the circulatory system: Secondary | ICD-10-CM

## 2014-03-26 DIAGNOSIS — K439 Ventral hernia without obstruction or gangrene: Secondary | ICD-10-CM | POA: Diagnosis present

## 2014-03-26 DIAGNOSIS — C541 Malignant neoplasm of endometrium: Secondary | ICD-10-CM | POA: Diagnosis present

## 2014-03-26 DIAGNOSIS — D271 Benign neoplasm of left ovary: Secondary | ICD-10-CM | POA: Diagnosis present

## 2014-03-26 DIAGNOSIS — Z79899 Other long term (current) drug therapy: Secondary | ICD-10-CM

## 2014-03-26 DIAGNOSIS — K56609 Unspecified intestinal obstruction, unspecified as to partial versus complete obstruction: Secondary | ICD-10-CM

## 2014-03-26 DIAGNOSIS — F17210 Nicotine dependence, cigarettes, uncomplicated: Secondary | ICD-10-CM | POA: Diagnosis present

## 2014-03-26 DIAGNOSIS — K913 Postprocedural intestinal obstruction, unspecified as to partial versus complete: Secondary | ICD-10-CM | POA: Insufficient documentation

## 2014-03-26 DIAGNOSIS — E785 Hyperlipidemia, unspecified: Secondary | ICD-10-CM | POA: Diagnosis present

## 2014-03-26 DIAGNOSIS — R0602 Shortness of breath: Secondary | ICD-10-CM | POA: Diagnosis not present

## 2014-03-26 DIAGNOSIS — Z8679 Personal history of other diseases of the circulatory system: Secondary | ICD-10-CM | POA: Diagnosis not present

## 2014-03-26 DIAGNOSIS — K565 Intestinal adhesions [bands] with obstruction (postprocedural) (postinfection): Secondary | ICD-10-CM | POA: Diagnosis present

## 2014-03-26 DIAGNOSIS — N840 Polyp of corpus uteri: Secondary | ICD-10-CM | POA: Diagnosis present

## 2014-03-26 DIAGNOSIS — Z951 Presence of aortocoronary bypass graft: Secondary | ICD-10-CM | POA: Diagnosis not present

## 2014-03-26 DIAGNOSIS — K567 Ileus, unspecified: Secondary | ICD-10-CM | POA: Diagnosis not present

## 2014-03-26 DIAGNOSIS — M7989 Other specified soft tissue disorders: Secondary | ICD-10-CM | POA: Diagnosis not present

## 2014-03-26 DIAGNOSIS — N950 Postmenopausal bleeding: Secondary | ICD-10-CM | POA: Diagnosis present

## 2014-03-26 DIAGNOSIS — I2510 Atherosclerotic heart disease of native coronary artery without angina pectoris: Secondary | ICD-10-CM | POA: Diagnosis present

## 2014-03-26 DIAGNOSIS — E780 Pure hypercholesterolemia: Secondary | ICD-10-CM | POA: Diagnosis present

## 2014-03-26 DIAGNOSIS — N83299 Other ovarian cyst, unspecified side: Secondary | ICD-10-CM | POA: Insufficient documentation

## 2014-03-26 DIAGNOSIS — F411 Generalized anxiety disorder: Secondary | ICD-10-CM

## 2014-03-26 DIAGNOSIS — T790XXA Air embolism (traumatic), initial encounter: Secondary | ICD-10-CM

## 2014-03-26 DIAGNOSIS — K432 Incisional hernia without obstruction or gangrene: Secondary | ICD-10-CM | POA: Diagnosis present

## 2014-03-26 DIAGNOSIS — R1900 Intra-abdominal and pelvic swelling, mass and lump, unspecified site: Secondary | ICD-10-CM

## 2014-03-26 DIAGNOSIS — K219 Gastro-esophageal reflux disease without esophagitis: Secondary | ICD-10-CM | POA: Diagnosis not present

## 2014-03-26 DIAGNOSIS — R140 Abdominal distension (gaseous): Secondary | ICD-10-CM

## 2014-03-26 HISTORY — PX: LAPAROTOMY: SHX154

## 2014-03-26 HISTORY — PX: ABDOMINAL HYSTERECTOMY: SHX81

## 2014-03-26 HISTORY — DX: Intra-abdominal and pelvic swelling, mass and lump, unspecified site: R19.00

## 2014-03-26 HISTORY — PX: VENTRAL HERNIA REPAIR: SHX424

## 2014-03-26 HISTORY — PX: OMENTECTOMY: SHX5985

## 2014-03-26 HISTORY — PX: SALPINGOOPHORECTOMY: SHX82

## 2014-03-26 HISTORY — PX: LYSIS OF ADHESION: SHX5961

## 2014-03-26 LAB — TYPE AND SCREEN
ABO/RH(D): A NEG
ANTIBODY SCREEN: POSITIVE
DAT, IgG: NEGATIVE

## 2014-03-26 SURGERY — REPAIR, HERNIA, VENTRAL
Anesthesia: General | Site: Abdomen

## 2014-03-26 MED ORDER — KCL IN DEXTROSE-NACL 20-5-0.9 MEQ/L-%-% IV SOLN
INTRAVENOUS | Status: DC
  Administered 2014-03-26: 14:00:00 via INTRAVENOUS
  Filled 2014-03-26 (×3): qty 1000

## 2014-03-26 MED ORDER — SODIUM CHLORIDE 0.9 % IJ SOLN
INTRAMUSCULAR | Status: AC
  Filled 2014-03-26: qty 10

## 2014-03-26 MED ORDER — NEOSTIGMINE METHYLSULFATE 10 MG/10ML IV SOLN
INTRAVENOUS | Status: AC
  Filled 2014-03-26: qty 1

## 2014-03-26 MED ORDER — METOCLOPRAMIDE HCL 5 MG/ML IJ SOLN
INTRAMUSCULAR | Status: DC | PRN
  Administered 2014-03-26: 10 mg via INTRAVENOUS

## 2014-03-26 MED ORDER — HYDROMORPHONE HCL 2 MG/ML IJ SOLN
INTRAMUSCULAR | Status: AC
  Filled 2014-03-26: qty 1

## 2014-03-26 MED ORDER — OXYCODONE HCL 5 MG/5ML PO SOLN: 5.0000 mg | Freq: Once | ORAL | Status: DC | PRN

## 2014-03-26 MED ORDER — DEXAMETHASONE SODIUM PHOSPHATE 10 MG/ML IJ SOLN
INTRAMUSCULAR | Status: DC | PRN
  Administered 2014-03-26: 5 mg via INTRAVENOUS

## 2014-03-26 MED ORDER — ONDANSETRON HCL 4 MG/2ML IJ SOLN: 4.0000 mg | Freq: Four times a day (QID) | INTRAMUSCULAR | Status: DC | PRN

## 2014-03-26 MED ORDER — DIPHENHYDRAMINE HCL 12.5 MG/5ML PO ELIX
12.5000 mg | ORAL_SOLUTION | Freq: Four times a day (QID) | ORAL | Status: DC | PRN
  Filled 2014-03-26: qty 5

## 2014-03-26 MED ORDER — GLYCOPYRROLATE 0.2 MG/ML IJ SOLN
INTRAMUSCULAR | Status: AC
Start: 2014-03-26 — End: 2014-03-26
  Filled 2014-03-26: qty 3

## 2014-03-26 MED ORDER — HYDROMORPHONE 0.3 MG/ML IV SOLN: INTRAVENOUS | Status: DC

## 2014-03-26 MED ORDER — ALBUMIN HUMAN 5 % IV SOLN
INTRAVENOUS | Status: DC | PRN
  Administered 2014-03-26: 10:00:00 via INTRAVENOUS

## 2014-03-26 MED ORDER — DIPHENHYDRAMINE HCL 50 MG/ML IJ SOLN: 12.5000 mg | Freq: Four times a day (QID) | INTRAMUSCULAR | Status: DC | PRN

## 2014-03-26 MED ORDER — SUFENTANIL CITRATE 50 MCG/ML IV SOLN
INTRAVENOUS | Status: AC
  Filled 2014-03-26: qty 1

## 2014-03-26 MED ORDER — ESCITALOPRAM OXALATE 20 MG PO TABS
20.0000 mg | ORAL_TABLET | Freq: Every morning | ORAL | Status: DC
  Administered 2014-03-27 – 2014-04-05 (×10): 20 mg via ORAL
  Filled 2014-03-26 (×10): qty 1

## 2014-03-26 MED ORDER — GLYCOPYRROLATE 0.2 MG/ML IJ SOLN
INTRAMUSCULAR | Status: AC
  Filled 2014-03-26: qty 1

## 2014-03-26 MED ORDER — SODIUM CHLORIDE 0.9 % IJ SOLN: 9.0000 mL | INTRAMUSCULAR | Status: DC | PRN

## 2014-03-26 MED ORDER — METOCLOPRAMIDE HCL 5 MG/ML IJ SOLN
INTRAMUSCULAR | Status: AC
  Filled 2014-03-26: qty 2

## 2014-03-26 MED ORDER — ALBUMIN HUMAN 5 % IV SOLN
INTRAVENOUS | Status: AC
  Filled 2014-03-26: qty 250

## 2014-03-26 MED ORDER — GLYCOPYRROLATE 0.2 MG/ML IJ SOLN
INTRAMUSCULAR | Status: DC | PRN
  Administered 2014-03-26: 0.1 mg via INTRAVENOUS
  Administered 2014-03-26: 0.6 mg via INTRAVENOUS

## 2014-03-26 MED ORDER — SUFENTANIL CITRATE 50 MCG/ML IV SOLN
INTRAVENOUS | Status: DC | PRN
  Administered 2014-03-26 (×5): 5 ug via INTRAVENOUS
  Administered 2014-03-26: 10 ug via INTRAVENOUS
  Administered 2014-03-26 (×3): 5 ug via INTRAVENOUS

## 2014-03-26 MED ORDER — OXYCODONE HCL 5 MG PO TABS
5.0000 mg | ORAL_TABLET | ORAL | Status: DC | PRN
  Administered 2014-03-26 – 2014-03-27 (×3): 5 mg via ORAL
  Filled 2014-03-26 (×3): qty 1

## 2014-03-26 MED ORDER — NEOSTIGMINE METHYLSULFATE 10 MG/10ML IV SOLN
INTRAVENOUS | Status: DC | PRN
  Administered 2014-03-26: 4 mg via INTRAVENOUS

## 2014-03-26 MED ORDER — ROCURONIUM BROMIDE 100 MG/10ML IV SOLN
INTRAVENOUS | Status: AC
  Filled 2014-03-26: qty 1

## 2014-03-26 MED ORDER — COLESEVELAM HCL 625 MG PO TABS
1875.0000 mg | ORAL_TABLET | Freq: Two times a day (BID) | ORAL | Status: DC
  Administered 2014-03-26 – 2014-04-05 (×18): 1875 mg via ORAL
  Filled 2014-03-26 (×21): qty 3

## 2014-03-26 MED ORDER — CEFAZOLIN SODIUM-DEXTROSE 2-3 GM-% IV SOLR
2.0000 g | INTRAVENOUS | Status: AC
  Administered 2014-03-26: 2 g via INTRAVENOUS

## 2014-03-26 MED ORDER — HYDROMORPHONE HCL 1 MG/ML IJ SOLN
0.5000 mg | INTRAMUSCULAR | Status: AC | PRN
  Administered 2014-03-27 (×2): 0.5 mg via INTRAVENOUS
  Filled 2014-03-26 (×2): qty 1

## 2014-03-26 MED ORDER — AMLODIPINE BESYLATE 10 MG PO TABS
10.0000 mg | ORAL_TABLET | Freq: Every day | ORAL | Status: DC
  Administered 2014-03-27 – 2014-04-05 (×9): 10 mg via ORAL
  Filled 2014-03-26 (×11): qty 1

## 2014-03-26 MED ORDER — EPHEDRINE SULFATE 50 MG/ML IJ SOLN
INTRAMUSCULAR | Status: DC | PRN
  Administered 2014-03-26: 10 mg via INTRAVENOUS
  Administered 2014-03-26: 5 mg via INTRAVENOUS

## 2014-03-26 MED ORDER — MIDAZOLAM HCL 2 MG/2ML IJ SOLN
INTRAMUSCULAR | Status: AC
  Filled 2014-03-26: qty 2

## 2014-03-26 MED ORDER — PHENYLEPHRINE HCL 10 MG/ML IJ SOLN
INTRAMUSCULAR | Status: AC
  Filled 2014-03-26: qty 1

## 2014-03-26 MED ORDER — ROCURONIUM BROMIDE 100 MG/10ML IV SOLN
INTRAVENOUS | Status: DC | PRN
  Administered 2014-03-26: 10 mg via INTRAVENOUS
  Administered 2014-03-26: 50 mg via INTRAVENOUS
  Administered 2014-03-26 (×3): 10 mg via INTRAVENOUS

## 2014-03-26 MED ORDER — ENOXAPARIN SODIUM 40 MG/0.4ML ~~LOC~~ SOLN
40.0000 mg | SUBCUTANEOUS | Status: DC
  Administered 2014-03-27 – 2014-04-05 (×10): 40 mg via SUBCUTANEOUS
  Filled 2014-03-26 (×12): qty 0.4

## 2014-03-26 MED ORDER — PROPOFOL 10 MG/ML IV BOLUS
INTRAVENOUS | Status: AC
  Filled 2014-03-26: qty 20

## 2014-03-26 MED ORDER — LACTATED RINGERS IV SOLN
INTRAVENOUS | Status: DC | PRN
  Administered 2014-03-26: 08:00:00 via INTRAVENOUS

## 2014-03-26 MED ORDER — ONDANSETRON HCL 4 MG/2ML IJ SOLN
INTRAMUSCULAR | Status: DC | PRN
  Administered 2014-03-26: 4 mg via INTRAVENOUS

## 2014-03-26 MED ORDER — 0.9 % SODIUM CHLORIDE (POUR BTL) OPTIME
TOPICAL | Status: DC | PRN
  Administered 2014-03-26: 3000 mL

## 2014-03-26 MED ORDER — LIDOCAINE HCL (CARDIAC) 20 MG/ML IV SOLN
INTRAVENOUS | Status: DC | PRN
  Administered 2014-03-26: 50 mg via INTRAVENOUS

## 2014-03-26 MED ORDER — ROSUVASTATIN CALCIUM 40 MG PO TABS
40.0000 mg | ORAL_TABLET | Freq: Every day | ORAL | Status: DC
  Administered 2014-03-26 – 2014-04-04 (×10): 40 mg via ORAL
  Filled 2014-03-26 (×11): qty 1

## 2014-03-26 MED ORDER — HYDROMORPHONE 0.3 MG/ML IV SOLN
INTRAVENOUS | Status: AC
  Administered 2014-03-26: 12:00:00
  Filled 2014-03-26: qty 25

## 2014-03-26 MED ORDER — TRAMADOL HCL 50 MG PO TABS
100.0000 mg | ORAL_TABLET | Freq: Four times a day (QID) | ORAL | Status: DC
  Administered 2014-03-26 – 2014-03-28 (×5): 100 mg via ORAL
  Filled 2014-03-26 (×6): qty 2

## 2014-03-26 MED ORDER — LACTATED RINGERS IV SOLN
INTRAVENOUS | Status: DC | PRN
  Administered 2014-03-26 (×3): via INTRAVENOUS

## 2014-03-26 MED ORDER — CHLORHEXIDINE GLUCONATE 4 % EX LIQD: 1.0000 "application " | Freq: Once | CUTANEOUS | Status: DC

## 2014-03-26 MED ORDER — NALOXONE HCL 0.4 MG/ML IJ SOLN: 0.4000 mg | INTRAMUSCULAR | Status: DC | PRN

## 2014-03-26 MED ORDER — SODIUM CHLORIDE 0.9 % IV SOLN
INTRAVENOUS | Status: DC | PRN
  Administered 2014-03-26: 10:00:00

## 2014-03-26 MED ORDER — BUPIVACAINE LIPOSOME 1.3 % IJ SUSP
INTRAMUSCULAR | Status: DC | PRN
  Administered 2014-03-26: 20 mL

## 2014-03-26 MED ORDER — ASPIRIN 81 MG PO CHEW
81.0000 mg | CHEWABLE_TABLET | Freq: Every morning | ORAL | Status: DC
  Administered 2014-03-27 – 2014-04-05 (×10): 81 mg via ORAL
  Filled 2014-03-26 (×10): qty 1

## 2014-03-26 MED ORDER — IBUPROFEN 800 MG PO TABS
800.0000 mg | ORAL_TABLET | Freq: Four times a day (QID) | ORAL | Status: DC
  Administered 2014-03-27 (×2): 800 mg via ORAL
  Filled 2014-03-26 (×9): qty 1

## 2014-03-26 MED ORDER — SODIUM CHLORIDE 0.9 % IV SOLN
10.0000 mg | INTRAVENOUS | Status: DC | PRN
  Administered 2014-03-26: 10 ug/min via INTRAVENOUS

## 2014-03-26 MED ORDER — DEXAMETHASONE SODIUM PHOSPHATE 10 MG/ML IJ SOLN
INTRAMUSCULAR | Status: AC
  Filled 2014-03-26: qty 1

## 2014-03-26 MED ORDER — AMLODIPINE-VALSARTAN-HCTZ 10-320-25 MG PO TABS: 1.0000 | ORAL_TABLET | Freq: Every morning | ORAL | Status: DC

## 2014-03-26 MED ORDER — ONDANSETRON HCL 4 MG/2ML IJ SOLN
INTRAMUSCULAR | Status: AC
  Filled 2014-03-26: qty 2

## 2014-03-26 MED ORDER — CEFAZOLIN SODIUM-DEXTROSE 2-3 GM-% IV SOLR
INTRAVENOUS | Status: AC
  Filled 2014-03-26: qty 50

## 2014-03-26 MED ORDER — PROPOFOL 10 MG/ML IV BOLUS
INTRAVENOUS | Status: DC | PRN
  Administered 2014-03-26: 150 mg via INTRAVENOUS
  Administered 2014-03-26: 1 mg via INTRAVENOUS

## 2014-03-26 MED ORDER — OXYCODONE HCL 5 MG PO TABS: 5.0000 mg | ORAL_TABLET | Freq: Once | ORAL | Status: DC | PRN

## 2014-03-26 MED ORDER — HYDROCHLOROTHIAZIDE 25 MG PO TABS
25.0000 mg | ORAL_TABLET | Freq: Every day | ORAL | Status: DC
  Administered 2014-03-27: 25 mg via ORAL
  Filled 2014-03-26 (×3): qty 1

## 2014-03-26 MED ORDER — EZETIMIBE 10 MG PO TABS
10.0000 mg | ORAL_TABLET | Freq: Every morning | ORAL | Status: DC
  Administered 2014-03-26 – 2014-04-05 (×11): 10 mg via ORAL
  Filled 2014-03-26 (×11): qty 1

## 2014-03-26 MED ORDER — MAGNESIUM HYDROXIDE 400 MG/5ML PO SUSP
30.0000 mL | Freq: Three times a day (TID) | ORAL | Status: AC
  Administered 2014-03-26 – 2014-03-27 (×3): 30 mL via ORAL
  Filled 2014-03-26 (×3): qty 30

## 2014-03-26 MED ORDER — ENSURE COMPLETE PO LIQD
237.0000 mL | Freq: Two times a day (BID) | ORAL | Status: DC
  Administered 2014-03-26: 237 mL via ORAL

## 2014-03-26 MED ORDER — IRBESARTAN 300 MG PO TABS
300.0000 mg | ORAL_TABLET | Freq: Every day | ORAL | Status: DC
  Administered 2014-03-27 – 2014-04-05 (×9): 300 mg via ORAL
  Filled 2014-03-26 (×11): qty 1

## 2014-03-26 MED ORDER — BUPIVACAINE LIPOSOME 1.3 % IJ SUSP
20.0000 mL | Freq: Once | INTRAMUSCULAR | Status: DC
  Filled 2014-03-26: qty 20

## 2014-03-26 MED ORDER — MIDAZOLAM HCL 5 MG/5ML IJ SOLN
INTRAMUSCULAR | Status: DC | PRN
  Administered 2014-03-26 (×2): 1 mg via INTRAVENOUS

## 2014-03-26 MED ORDER — HYDROMORPHONE HCL 1 MG/ML IJ SOLN
INTRAMUSCULAR | Status: DC | PRN
  Administered 2014-03-26 (×4): 0.5 mg via INTRAVENOUS

## 2014-03-26 MED ORDER — PROMETHAZINE HCL 25 MG/ML IJ SOLN: 6.2500 mg | INTRAMUSCULAR | Status: DC | PRN

## 2014-03-26 MED ORDER — ACETAMINOPHEN 500 MG PO TABS
1000.0000 mg | ORAL_TABLET | Freq: Four times a day (QID) | ORAL | Status: DC
  Administered 2014-03-26 – 2014-03-27 (×4): 1000 mg via ORAL
  Filled 2014-03-26 (×12): qty 2

## 2014-03-26 MED ORDER — HYDROMORPHONE HCL 1 MG/ML IJ SOLN: 0.2500 mg | INTRAMUSCULAR | Status: DC | PRN

## 2014-03-26 MED ORDER — AMLODIPINE BESYLATE 10 MG PO TABS
10.0000 mg | ORAL_TABLET | Freq: Once | ORAL | Status: AC
  Administered 2014-03-26: 10 mg via ORAL
  Filled 2014-03-26: qty 1

## 2014-03-26 MED ORDER — LIDOCAINE HCL (CARDIAC) 20 MG/ML IV SOLN
INTRAVENOUS | Status: AC
  Filled 2014-03-26: qty 5

## 2014-03-26 SURGICAL SUPPLY — 85 items
APPLIER CLIP 5 13 M/L LIGAMAX5 (MISCELLANEOUS)
ATTRACTOMAT 16X20 MAGNETIC DRP (DRAPES) ×5 IMPLANT
BAG DECANTER FOR FLEXI CONT (MISCELLANEOUS) ×5 IMPLANT
BENZOIN TINCTURE PRP APPL 2/3 (GAUZE/BANDAGES/DRESSINGS) IMPLANT
BINDER ABDOMINAL 12 ML 46-62 (SOFTGOODS) ×5 IMPLANT
BLADE EXTENDED COATED 6.5IN (ELECTRODE) ×5 IMPLANT
CHLORAPREP W/TINT 26ML (MISCELLANEOUS) ×5 IMPLANT
CLIP APPLIE 5 13 M/L LIGAMAX5 (MISCELLANEOUS) IMPLANT
CLIP TI MEDIUM LARGE 6 (CLIP) ×5 IMPLANT
CONT SPEC 4OZ CLIKSEAL STRL BL (MISCELLANEOUS) ×5 IMPLANT
COVER SURGICAL LIGHT HANDLE (MISCELLANEOUS) IMPLANT
DECANTER SPIKE VIAL GLASS SM (MISCELLANEOUS) ×5 IMPLANT
DEVICE SECURE STRAP 25 ABSORB (INSTRUMENTS) ×5 IMPLANT
DEVICE TROCAR PUNCTURE CLOSURE (ENDOMECHANICALS) IMPLANT
DISSECTOR BLUNT TIP ENDO 5MM (MISCELLANEOUS) IMPLANT
DRAPE INCISE IOBAN 66X45 STRL (DRAPES) IMPLANT
DRAPE LAPAROSCOPIC ABDOMINAL (DRAPES) IMPLANT
DRAPE UTILITY 15X26 (DRAPE) ×5 IMPLANT
DRAPE UTILITY XL STRL (DRAPES) IMPLANT
DRAPE WARM FLUID 44X44 (DRAPE) ×5 IMPLANT
DRESSING TELFA ISLAND 4X8 (GAUZE/BANDAGES/DRESSINGS) IMPLANT
DRSG OPSITE POSTOP 4X10 (GAUZE/BANDAGES/DRESSINGS) ×5 IMPLANT
ELECT LIGASURE SHORT 9 REUSE (ELECTRODE) IMPLANT
ELECT REM PT RETURN 9FT ADLT (ELECTROSURGICAL) ×10
ELECTRODE REM PT RTRN 9FT ADLT (ELECTROSURGICAL) ×8 IMPLANT
GAUZE SPONGE 4X4 12PLY STRL (GAUZE/BANDAGES/DRESSINGS) ×5 IMPLANT
GAUZE SPONGE 4X4 16PLY XRAY LF (GAUZE/BANDAGES/DRESSINGS) IMPLANT
GLOVE BIO SURGEON STRL SZ 6 (GLOVE) ×10 IMPLANT
GLOVE BIO SURGEON STRL SZ 6.5 (GLOVE) ×10 IMPLANT
GLOVE BIOGEL M STRL SZ7.5 (GLOVE) ×10 IMPLANT
GLOVE BIOGEL PI IND STRL 7.0 (GLOVE) ×4 IMPLANT
GLOVE BIOGEL PI INDICATOR 7.0 (GLOVE) ×1
GLOVE SURG SS PI 7.5 STRL IVOR (GLOVE) ×5 IMPLANT
GOWN STRL REUS W/ TWL LRG LVL3 (GOWN DISPOSABLE) ×12 IMPLANT
GOWN STRL REUS W/TWL LRG LVL3 (GOWN DISPOSABLE) ×3 IMPLANT
GOWN STRL REUS W/TWL XL LVL3 (GOWN DISPOSABLE) ×15 IMPLANT
HOLDER FOLEY CATH W/STRAP (MISCELLANEOUS) ×5 IMPLANT
KIT BASIN OR (CUSTOM PROCEDURE TRAY) ×10 IMPLANT
LIGASURE IMPACT 36 18CM CVD LR (INSTRUMENTS) ×5 IMPLANT
LIQUID BAND (GAUZE/BANDAGES/DRESSINGS) ×5 IMPLANT
LOOP VESSEL MAXI BLUE (MISCELLANEOUS) IMPLANT
MARKER SKIN DUAL TIP RULER LAB (MISCELLANEOUS) ×5 IMPLANT
MESH VENT ST 19.6X24.6CM OVL (Mesh General) ×5 IMPLANT
NEEDLE HYPO 22GX1.5 SAFETY (NEEDLE) IMPLANT
NEEDLE SPNL 22GX3.5 QUINCKE BK (NEEDLE) IMPLANT
NS IRRIG 1000ML POUR BTL (IV SOLUTION) IMPLANT
PACK GENERAL/GYN (CUSTOM PROCEDURE TRAY) ×5 IMPLANT
PAD ABD 8X10 STRL (GAUZE/BANDAGES/DRESSINGS) ×5 IMPLANT
PENCIL BUTTON HOLSTER BLD 10FT (ELECTRODE) IMPLANT
SCISSORS LAP 5X35 DISP (ENDOMECHANICALS) IMPLANT
SET IRRIG TUBING LAPAROSCOPIC (IRRIGATION / IRRIGATOR) IMPLANT
SHEARS HARMONIC ACE PLUS 36CM (ENDOMECHANICALS) IMPLANT
SHEET LAVH (DRAPES) ×5 IMPLANT
SLEEVE ADV FIXATION 5X100MM (TROCAR) IMPLANT
SLEEVE Z-THREAD 5X100MM (TROCAR) IMPLANT
SOLUTION ANTI FOG 6CC (MISCELLANEOUS) IMPLANT
SPONGE LAP 18X18 X RAY DECT (DISPOSABLE) ×10 IMPLANT
STAPLER VISISTAT 35W (STAPLE) IMPLANT
STRIP CLOSURE SKIN 1/2X4 (GAUZE/BANDAGES/DRESSINGS) IMPLANT
SUT MNCRL AB 4-0 PS2 18 (SUTURE) IMPLANT
SUT NOVA NAB GS-21 0 18 T12 DT (SUTURE) ×20 IMPLANT
SUT PDS AB 1 TP1 96 (SUTURE) ×10 IMPLANT
SUT PROLENE 0 CT 1 CR/8 (SUTURE) IMPLANT
SUT VIC AB 0 CT1 36 (SUTURE) ×25 IMPLANT
SUT VIC AB 2-0 CT1 36 (SUTURE) ×30 IMPLANT
SUT VIC AB 2-0 CT2 27 (SUTURE) ×55 IMPLANT
SUT VIC AB 2-0 SH 27 (SUTURE) ×2
SUT VIC AB 2-0 SH 27X BRD (SUTURE) ×8 IMPLANT
SUT VIC AB 3-0 CTX 36 (SUTURE) IMPLANT
SUT VICRYL 2 0 18  UND BR (SUTURE) ×1
SUT VICRYL 2 0 18 UND BR (SUTURE) ×4 IMPLANT
SYR 20CC LL (SYRINGE) IMPLANT
TACKER 5MM HERNIA 3.5CML NAB (ENDOMECHANICALS) IMPLANT
TOWEL OR 17X26 10 PK STRL BLUE (TOWEL DISPOSABLE) ×10 IMPLANT
TOWEL OR NON WOVEN STRL DISP B (DISPOSABLE) ×5 IMPLANT
TRAY FOLEY CATH 14FRSI W/METER (CATHETERS) ×5 IMPLANT
TRAY LAPAROSCOPIC (CUSTOM PROCEDURE TRAY) ×5 IMPLANT
TROCAR ADV FIXATION 11X100MM (TROCAR) IMPLANT
TROCAR ADV FIXATION 5X100MM (TROCAR) IMPLANT
TROCAR BLADELESS OPT 5 100 (ENDOMECHANICALS) IMPLANT
TROCAR BLADELESS OPT 5 75 (ENDOMECHANICALS) IMPLANT
TROCAR XCEL NON-BLD 11X100MML (ENDOMECHANICALS) IMPLANT
TROCAR XCEL UNIV SLVE 11M 100M (ENDOMECHANICALS) IMPLANT
TUBING INSUFFLATION 10FT LAP (TUBING) IMPLANT
WATER STERILE IRR 1500ML POUR (IV SOLUTION) IMPLANT

## 2014-03-26 NOTE — Interval H&P Note (Signed)
History and Physical Interval Note:  03/26/2014 7:27 AM  [name]  has presented today for surgery, with the diagnosis of ventral incisional hernia  The various methods of treatment have been discussed with the patient and family. After consideration of risks, benefits and other options for treatment, the patient has consented to  Procedure(s): LAPAROSCOPIC POSSIBLE OPEN  VENTRAL INCISIONAL  HERNIA (N/A) EXPLORATORY LAPAROTOMY (N/A) TOTAL HYSTERECTOMY ABDOMINAL (N/A) BILATERL SALPINGO OOPHORECTOMY (Bilateral) DEBULKING (N/A) as a surgical intervention .  The patient's history has been reviewed, patient examined, no change in status, stable for surgery.  I have reviewed the patient's chart and labs.  Questions were answered to the patient's satisfaction.     [name]

## 2014-03-26 NOTE — Anesthesia Postprocedure Evaluation (Signed)
  Anesthesia Post-op Note  Patient: [name]  Procedure(s) Performed: Procedure(s) (LRB): EXPLORATORY LAPAROTOMY (N/A) TOTAL HYSTERECTOMY ABDOMINAL (N/A) BILATERL SALPINGO OOPHORECTOMY (Bilateral) OMENTECTOMY LYSIS OF ADHESION HERNIA REPAIR VENTRAL ADULT  Patient Location: PACU  Anesthesia Type: General  Level of Consciousness: awake and alert   Airway and Oxygen Therapy: Patient Spontanous Breathing  Post-op Pain: mild  Post-op Assessment: Post-op Vital signs reviewed, Patient's Cardiovascular Status Stable, Respiratory Function Stable, Patent Airway and No signs of Nausea or vomiting  Last Vitals:  Filed Vitals:   03/26/14 1215  BP: 99/58  Pulse: 75  Temp:   Resp: 11    Post-op Vital Signs: stable   Complications: No apparent anesthesia complications

## 2014-03-26 NOTE — Op Note (Signed)
PATIENT: [name] DATE: 03/26/14   Preop Diagnosis: Bilateral ovarian masses, postmenopausal bleeding  Postoperative Diagnosis: same and benign ovaries and endometrium  Surgery: Total abdominal hysterectomy, bilateral salpingo-oophorectomy, omentectomy, lysis of adhesions  Surgeons:  Everitt Amber, MD; Lahoma Crocker, MD (an MD assistant was necessary for tissue manipulation, retraction and positioning due to the complexity of the case and hospital policies).    Anesthesia: General   Estimated blood loss: 150 ml  IVF: 1000  ml   Urine output: 101  ml   Complications: None   Pathology: Uterus, cervix, bilateral tubes and ovaries, omentum  Operative findings: Bilateral 6cm ovarian masses with excrescences (benign of frozen), normal appearing uterus 6 week size (benign endometrium on frozen). Dense adhesions between omentum and transverse colon to anterior abdominal wall and hernia sac in upper abdomen.  Procedure: The patient was identified in the preoperative holding area. Informed consent was signed on the chart. Patient was seen history was reviewed and exam was performed.   The patient was then taken to the operating room and placed in the supine position with SCD hose on. General anesthesia was then induced without difficulty. She was then placed in the dorsolithotomy position. The abdomen was prepped with chlor prep sponges per protocol. Perineum was prepped with Betadine. The vagina was prepped with Betadine a Foley catheter was inserted into the bladder under sterile conditions.  The patient was then draped after the prep was dried. Timeout was performed the patient, procedure, antibiotic, allergy, and length of procedure. A vertical midline infraumbilical incision was and carried down to the underlying fascia using Bovie cautery. The fascia was scored in the fascial incision was extended superiorly and inferiorly using Bovie cautery. The rectus bellies were  dissected off the overlying fascia. The peritoneum was tented and entered. The peritoneal incision was extended superiorly and inferiorly with visualization of the underlying peritoneal cavity. Meticulous sharp dissection with metzembaum scissors was performed to take down the adhesions between the omentum, transverse colon and anterior abdominal wall. The hernia sac was emptied of its contents with sharp dissection. The lysis of adhesions took approximately 30 minutes.The Buchwalter self-retaining retractor was then placed. At the initial placement as well as at several points during the case the lateral blades were checked to ensure no significant pressure on the psoas bellies.  The omentectomy was performed by creating windows in the pareital peritoneum and to dissect it free from the transverse colon. The ligasure was used to seal larger pedicles to remove the infracolic omentum.  The small and large bowel were packed out of the way of the surgical field with moist laparotomy sponges and malleable retractors were attached to the [name]. Abdominal pelvic washings were obtained. The round ligament on the patient's right side was transected with monopolar cautery the anterior posterior leaves the broad ligament were opened. A window was made between the infundibulopelvic vessels and the ureter. The vessels were clamped x2 transected and suture ligated. The bladder flap was created at this point and the uterine vessels on the right side were skeletonized. The uterine vessels were clamped with curved Masterson clamps transected and suture ligated. Our attention was turned to the left side were similar procedure was performed in that the round ligament was transected and the anterior and posterior leaves the broad ligament were opened. A window was made between the vessels and the ureter on the left and the vessels were clamped x2 transected and suture ligated. The uterine vessels were skeletonized clamped x2  transected and suture ligated. We continued down the cardinal ligaments with straight Masterson clamps the cervicovaginal junction was reached. We came across the vagina just under the cervix with curved Masterson clamps. The cervix was amputated from the vagina. The angle pedicles as well as vaginal cuff were closed using figure-of-eight sutures of 0 Vicryl.  At this point frozen section returned as benign ovarian cysts and endometrium. Dr Excell Seltzer became present in the OR to continue the procedure as a hernia repair and to close the incision.  All instrument, suture, laparotomy, Ray-Tec, and needle counts were correct x2. This is Everitt Amber dictating an operative note on [name].  Donaciano Eva, MD

## 2014-03-26 NOTE — H&P (View-Only) (Signed)
Preop Consult Note: Gyn-Onc   [name] 69 y.o. female  Chief Complaint  Patient presents with  . bilateral cystic and solid adnexal masses    Assessment : Complex bilateral adnexal masses, endometrial polyp. Ventral hernia   Plan: I reviewed the patient's CT scan and personally viewed the images and other laboratory work including a CA-125. I recommend the patient undergo exploratory laparotomy, bilateral salpingo-oophorectomy and hysterectomy with intraoperative frozen section of the adnexal masses as well as the uterus. We are coordinating the surgery with Dr. Excell Seltzer for repair of the hernia.  She has recently seen Dr.Hochren for initial cardiac evaluation. He has provided [place] with written risk stratification for the surgery describing it as low risk from a cardiac standpoint and it is okay to proceed without further cardiac testing. She normally takes aspirin daily for her history of coronary artery disease and bypass surgery however the patient admitted to me she has not been taking this recently. I'm recommending she continue to abstain preoperatively and resume taking aspirin postoperatively.  The risks of surgery including hemorrhage infection injury to adjacent viscera thrombolic complications and anesthetic risks were reviewed. 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 I explained to the patient that I will be leaving town proximal 3 days after her surgery and offered her an alternative date of surgery however she is electing to proceed at the current surgical date. She understands that during my absence I will have coverage for her care by my partners. All the patient's questions are  answered.    HPI: 69 year old white married female seen in consultation request of Dr. Louretta Shorten regarding management of bilateral complex adnexal masses and what appears to be an endometrial polyp with associated postmenopausal bleeding. The patient initially presented with some postmenopausal bleeding and underwent an ultrasound that showed both what appears to be an endometrial polyp as well as complex adnexal masses. The patient subsequent was had a CT scan of the abdomen and pelvis shows no evidence of adenopathy ascites or metastatic disease. CA-125 is 16. The patient denies any past history of gynecologic problems.  Relevant medical history includes a coronary artery bypass procedure 1994 and abdominal aortic aneurysm repair in 2003 and subsequent exploratory laparotomy for an intestinal obstruction 2007. The patient has a ventral hernia at the present time which is relatively asymptomatic. She has seen Dr. Excell Seltzer regarding repair of the hernia.  Patient has no family history of breast or ovarian cancer.  Review of Systems:10 point review of systems is negative except as noted in interval history.   Vitals: Blood pressure 132/80, pulse 94, temperature 97.8 F (36.6 C), temperature source Oral, resp. rate 22, height 5\' 9"  (1.753 m), weight 186 lb 9.6 oz (84.641 kg).  Physical Exam: General : The patient is a healthy woman in no acute distress.  HEENT: normocephalic, extraoccular movements normal; neck is supple without thyromegally  Lynphnodes: Supraclavicular and inguinal nodes not enlarged  Abdomen: Soft, non-tender, no ascites, no organomegally, no masses, no hernias Are midline incision with associated ventral hernia which is easily reducible. Pelvic:  EGBUS: Normal female  Vagina: Normal, no lesions  Urethra and Bladder: Normal, non-tender  Cervix: Normal Uterus: Normal shape size and consistency  Bi-manual examination: Non-tender; no adenxal masses or nodularity I'm unable to  appreciate the masses. Rectal: normal sphincter tone, no masses, no blood. No nodularity Lower extremities: No edema or varicosities. Normal range of motion      No Known Allergies  Past Medical History  Diagnosis Date  . HTN (hypertension)   . Hyperlipidemia   . CAD (coronary artery disease)     2009 RCA occluded proximally, LAD occluded proximally, left main 90% stenosis, LIMA to the LAD widely patent, SVG to OM and diagonal patent, SVG RCA patent. This stenosis after the anastomosis of 80% in the native vessel. This was treated with PCI.  Marland Kitchen Collagen vascular disease     Past Surgical History  Procedure Laterality Date  . Coronary artery bypass graft  1994    LIMA to the LAD, SVG to RCA, SVG sequential to OM and diagonal,  . Abdominal aortic aneurysm repair  2003    Current Outpatient Prescriptions  Medication Sig Dispense Refill  . Amlodipine-Valsartan-HCTZ 10-320-25 MG TABS Take 1 tablet by mouth daily.  3  . aspirin 81 MG tablet Take 81 mg by mouth daily.    Marland Kitchen BIOTIN PO Take 1 tablet by mouth daily.    . colesevelam (WELCHOL) 625 MG tablet Take 1,875 mg by mouth 2 (two) times daily.    Marland Kitchen escitalopram (LEXAPRO) 20 MG tablet Take 20 mg by mouth daily.  3  . ezetimibe (ZETIA) 10 MG tablet Take 10 mg by mouth daily.    . Omega-3 Fatty Acids (FISH OIL PO) Take 6 capsules by mouth daily.    . rosuvastatin (CRESTOR) 40 MG tablet Take 40 mg by mouth daily.     No current facility-administered medications for this visit.    History   Social History  . Marital Status: Married    Spouse Name: N/A  . Number of Children: 3  . Years of Education: N/A   Occupational History  . Not on file.   Social History Main Topics  . Smoking status: Light Tobacco Smoker    Types: Cigarettes  . Smokeless tobacco: Not on file  . Alcohol Use: No  . Drug Use: No  . Sexual Activity: Not Currently   Other Topics Concern  . Not on file   Social History Narrative   Lives with husband  and 11 year old.      Family History  Problem Relation Age of Onset  . CAD Father 50    Died of MI  . Hypertension Brother   . Cancer Sister     Ovarian      Donaciano Eva, MD 03/15/2014, 12:20 PM

## 2014-03-26 NOTE — Transfer of Care (Signed)
Immediate Anesthesia Transfer of Care Note  Patient: [name]  Procedure(s) Performed: Procedure(s): EXPLORATORY LAPAROTOMY (N/A) TOTAL HYSTERECTOMY ABDOMINAL (N/A) BILATERL SALPINGO OOPHORECTOMY (Bilateral) OMENTECTOMY LYSIS OF ADHESION HERNIA REPAIR VENTRAL ADULT  Patient Location: PACU  Anesthesia Type:General  Level of Consciousness: awake, alert , oriented and patient cooperative  Airway & Oxygen Therapy: Patient Spontanous Breathing and Patient connected to face mask oxygen  Post-op Assessment: Report given to RN and Post -op Vital signs reviewed and stable  Post vital signs: Reviewed and stable  Last Vitals:  Filed Vitals:   03/26/14 1130  BP: 124/72  Pulse: 83  Temp:   Resp: 14    Complications: No apparent anesthesia complications

## 2014-03-26 NOTE — Progress Notes (Signed)
Pt had full dilaudid PCA programmed with no active order.  Nelly Laurence, MD. She stated that she purposefully D/C'd the PCA and wanted pt to try oral pain medication.  Dr. Delsa Sale also stated that if she needed more than 20 consecutive doses of PRN dilaudid between oral pain medication, full dose dilaudid PCA can be restarted. Pt is cooperative and understanding. PO pain medication given.  Will continue to monitor.  Roselind Rily

## 2014-03-26 NOTE — Interval H&P Note (Signed)
History and Physical Interval Note:  03/26/2014 7:21 AM  [name]  has presented today for surgery, with the diagnosis of ventral incisional hernia  The various methods of treatment have been discussed with the patient and family. After consideration of risks, benefits and other options for treatment, the patient has consented to  Procedure(s): LAPAROSCOPIC POSSIBLE OPEN  VENTRAL INCISIONAL  HERNIA (N/A) EXPLORATORY LAPAROTOMY (N/A) TOTAL HYSTERECTOMY ABDOMINAL (N/A) BILATERL SALPINGO OOPHORECTOMY (Bilateral) DEBULKING (N/A) as a surgical intervention .  The patient's history has been reviewed, patient examined, no change in status, stable for surgery.  I have reviewed the patient's chart and labs.  Questions were answered to the patient's satisfaction.     Arienne Gartin T

## 2014-03-26 NOTE — Op Note (Signed)
Preoperative Diagnosis: ventral incisional hernia  Postoprative Diagnosis: ventral incisional hernia  Procedure: Procedure(s):  [place] A   Surgeon: Excell Seltzer T   Assistants: None  Anesthesia:  General endotracheal anesthesia  Indications: [name] with previous Aortic surgery as well as exploratory laparotomy. She now has a fairly large approximately 6 cm symptomatic ventral incisional hernia above the umbilicus. She is going to require TAH/BSO for a pelvic mass and possible malignancy. We have discussed proceeding with ventral hernia repair using mesh at the completion of his procedure. We've discussed the pros and cons of staged versus one stage repair and the possible slight increase risk of mesh infection in a clean contaminated case. We have elected to proceed with repair possibly open versus laparoscopic depending on operative findings.    Procedure Detail:  The surgery is begun at the completion of TAH/BSO which was uncomplicated without any bowel injury or contamination. The anterior abdomen had been mostly cleared. There was a large approximately 6 cm x 4-5 cm defect in the mid epigastrium with several Swiss cheese defects below that down toward the umbilicus and up into the epigastrium as well. There was a midline incision that was opened from the pubis to just below the umbilicus. I felt I could expose the upper anterior abdominal wall through this incision and place an intra-abdominal piece of protected mesh. Measuring the area to be covered which would extend from the xiphoid down to well below the umbilicus I chose a 27 x 20 cm piece of Ventrio mesh. I placed 7 stay sutures around the circumference of the mesh in the Vicryl ring leaving the 6:00 position without a suture as it was going to be incorporated into the low midline incision below the umbilicus. The mesh was irrigated with antibiotic solution and placed into  the abdominal cavity and oriented. With careful retraction under direct vision through small separate stab incisions the stay sutures were retrieved with the Endo Close and brought up through the anterior abdominal wall in appropriate positions with nice broad deployment of the mesh around the hernia defects and good overlap. The sutures were then secured and I further secured the circumference of the mesh except for the very lower portion with the Securestrap tacker through the cuff of the mesh circumferentially so there were no gaps. The mesh and abdomen were irrigated with anabolic solution. I then began closure of the low midline incision from either end with running looped #1 PDS with occasional interrupted 0 Novafil. The lower portion of the mesh was sutured up to the anterior abdominal wall with interrupted 0 Novafil on either side of the incision as well as tacks to exclude any gaps. An additional 0 Novafil was placed closing the fascia and peritoneum and incorporating the 6:00 and the mesh to exclude any gaps. The closure was then completed with the PDS. The wound was irrigated with antibiotics solution. The midline wound was closed with staples. The small stab incisions for the transfixion sutures were closed with Dermabond. Sponge needle and instrument counts were correct. Abdominal binder was placed.     Findings: As above  Estimated Blood Loss:  Minimal         Drains: none  Blood Given: none          Specimens: None        Complications:  * No complications entered in OR log *         Disposition: PACU -  hemodynamically stable.         Condition: stable

## 2014-03-27 ENCOUNTER — Encounter (HOSPITAL_COMMUNITY): Payer: Self-pay | Admitting: General Surgery

## 2014-03-27 LAB — BASIC METABOLIC PANEL
Anion gap: 12 (ref 5–15)
BUN: 22 mg/dL (ref 6–23)
CALCIUM: 8.8 mg/dL (ref 8.4–10.5)
CO2: 26 mmol/L (ref 19–32)
Chloride: 104 mmol/L (ref 96–112)
Creatinine, Ser: 1.18 mg/dL — ABNORMAL HIGH (ref 0.50–1.10)
GFR calc Af Amer: 54 mL/min — ABNORMAL LOW (ref >90)
GFR calc non Af Amer: 46 mL/min — ABNORMAL LOW (ref >90)
GLUCOSE: 127 mg/dL — AB (ref 70–99)
POTASSIUM: 4.8 mmol/L (ref 3.5–5.1)
SODIUM: 142 mmol/L (ref 135–145)

## 2014-03-27 LAB — CBC
HCT: 32.8 % — ABNORMAL LOW (ref 36.0–46.0)
Hemoglobin: 10.6 g/dL — ABNORMAL LOW (ref 12.0–15.0)
MCH: 29.3 pg (ref 26.0–34.0)
MCHC: 32.3 g/dL (ref 30.0–36.0)
MCV: 90.6 fL (ref 78.0–100.0)
PLATELETS: 209 10*3/uL (ref 150–400)
RBC: 3.62 MIL/uL — AB (ref 3.87–5.11)
RDW: 14.2 % (ref 11.5–15.5)
WBC: 10.6 10*3/uL — ABNORMAL HIGH (ref 4.0–10.5)

## 2014-03-27 MED ORDER — SODIUM CHLORIDE 0.9 % IJ SOLN: 9.0000 mL | INTRAMUSCULAR | Status: DC | PRN

## 2014-03-27 MED ORDER — DIPHENHYDRAMINE HCL 12.5 MG/5ML PO ELIX: 12.5000 mg | ORAL_SOLUTION | Freq: Four times a day (QID) | ORAL | Status: DC | PRN

## 2014-03-27 MED ORDER — NALOXONE HCL 0.4 MG/ML IJ SOLN: 0.4000 mg | INTRAMUSCULAR | Status: DC | PRN

## 2014-03-27 MED ORDER — ONDANSETRON HCL 4 MG/2ML IJ SOLN: 4.0000 mg | Freq: Four times a day (QID) | INTRAMUSCULAR | Status: DC | PRN

## 2014-03-27 MED ORDER — FAMOTIDINE 20 MG PO TABS
20.0000 mg | ORAL_TABLET | Freq: Two times a day (BID) | ORAL | Status: DC
  Administered 2014-03-28 (×3): 20 mg via ORAL
  Filled 2014-03-27 (×6): qty 1

## 2014-03-27 MED ORDER — ONDANSETRON HCL 4 MG/2ML IJ SOLN
4.0000 mg | Freq: Four times a day (QID) | INTRAMUSCULAR | Status: DC | PRN
  Administered 2014-03-28 – 2014-03-29 (×3): 4 mg via INTRAVENOUS
  Filled 2014-03-27 (×3): qty 2

## 2014-03-27 MED ORDER — DIPHENHYDRAMINE HCL 50 MG/ML IJ SOLN: 12.5000 mg | Freq: Four times a day (QID) | INTRAMUSCULAR | Status: DC | PRN

## 2014-03-27 MED ORDER — HYDROMORPHONE 0.3 MG/ML IV SOLN
INTRAVENOUS | Status: DC
  Administered 2014-03-27: 05:00:00 via INTRAVENOUS
  Administered 2014-03-27: 1.2 mg via INTRAVENOUS
  Filled 2014-03-27: qty 25

## 2014-03-27 MED ORDER — ONDANSETRON HCL 4 MG/2ML IJ SOLN
INTRAMUSCULAR | Status: AC
  Administered 2014-03-27: 4 mg
  Filled 2014-03-27: qty 2

## 2014-03-27 MED ORDER — SODIUM CHLORIDE 0.9 % IV BOLUS (SEPSIS)
250.0000 mL | Freq: Once | INTRAVENOUS | Status: AC
  Administered 2014-03-27: 250 mL via INTRAVENOUS

## 2014-03-27 NOTE — Progress Notes (Signed)
1 Day Post-Op Procedure(s) (LRB): EXPLORATORY LAPAROTOMY (N/A) TOTAL HYSTERECTOMY ABDOMINAL (N/A) BILATERL SALPINGO OOPHORECTOMY (Bilateral) OMENTECTOMY LYSIS OF ADHESION HERNIA REPAIR VENTRAL ADULT  Subjective: Patient reports pain, otherwise feeling well. Pain is somewhat controlled by PCA. Foley in place. Has not yet voided.    Objective: Vital signs in last 24 hours: Temp:  [97.8 F (36.6 C)-98.9 F (37.2 C)] 98.9 F (37.2 C) (03/16 1000) Pulse Rate:  [69-85] 69 (03/16 1000) Resp:  [16-21] 18 (03/16 1000) BP: (89-124)/(51-58) 101/56 mmHg (03/16 1000) SpO2:  [91 %-93 %] 91 % (03/16 1000) Last BM Date: 03/25/14  Intake/Output from previous day: 03/15 0701 - 03/16 0700 In: 4250 [I.V.:4000; IV Piggyback:250] Out: 1090 [Urine:890; Blood:200]  Physical Examination: General: alert and cooperative Resp: clear to auscultation bilaterally Cardio: regular rate and rhythm, S1, S2 normal, no murmur, click, rub or gallop GI: soft, non-tender; bowel sounds normal; no masses,  no organomegaly, incision: clean, dry and intact and limited exam secondary to binder in place Vaginal Bleeding: none  Labs: WBC/Hgb/Hct/Plts:  10.6/10.6/32.8/209 (03/16 0535) BUN/Cr/glu/ALT/AST/amyl/lip:  22/1.18/--/--/--/--/-- (03/16 0535)   Assessment:  69 y.o. s/p Procedure(s): EXPLORATORY LAPAROTOMY TOTAL HYSTERECTOMY ABDOMINAL BILATERL SALPINGO OOPHORECTOMY OMENTECTOMY LYSIS OF ADHESION HERNIA REPAIR VENTRAL ADULT: stable Pain:  Pain is well-controlled on PCA or oral medications. D.c. PCA and encourage orals  Heme:appropriate postop Hb  ID: no issues  CV: stable BP .  GI:  Tolerating po: Yes    FEN: hep lock IVF  Endo: no issues.  Prophylaxis: pharmacologic prophylaxis (with any of the following: enoxaparin (Lovenox) 40mg  SQ 2 hours prior to surgery then every day).  Plan: Advance diet Encourage ambulation Advance to PO medication Discontinue IV fluids d.c. foley Dispo:  Eventual  discharge to home. Anticipate on POD 2 vs 3. Communicated this with patient.   LOS: 1 day    Donaciano Eva 03/27/2014, 2:57 PM

## 2014-03-27 NOTE — Care Management Note (Signed)
    Page 1 of 1   03/27/2014     10:51:10 AM CARE MANAGEMENT NOTE 03/27/2014  Patient:  [name], [name]   Account Number:  [id number]  Date Initiated:  03/27/2014  Documentation initiated by:  Sunday Spillers  Subjective/Objective Assessment:   69 yo female admitted s/p hysterectomy. PTA lived at home with spouse.     Action/Plan:   Home when stable   Anticipated DC Date:  03/27/2014   Anticipated DC Plan:  [place]  CM consult      Choice offered to / List presented to:             Status of service:  Completed, signed off Medicare Important Message given?   (If response is "NO", the following Medicare IM given date fields will be blank) Date Medicare IM given:   Medicare IM given by:   Date Additional Medicare IM given:   Additional Medicare IM given by:    Discharge Disposition:  HOME/SELF CARE  Per UR Regulation:  Reviewed for med. necessity/level of care/duration of stay  If discussed at [place] of Stay Meetings, dates discussed:    Comments:

## 2014-03-27 NOTE — Progress Notes (Signed)
Pt complained of pain throughout night after receiving both scheduled and as needed doses of pain medication. She states when she moves she has lots of pain and is unable to get comfortable, yet she laughs and talks throughout the night with female figure at bedside. Pt. Repeatedly states she is normally not one to complain about pain but this is unbearable. Orders for PCA  Dilaudid, transcribed under "manage order" by Dr. Delsa Sale.

## 2014-03-27 NOTE — Progress Notes (Signed)
Patient ID: [name], female   DOB: 1945-09-01, 69 y.o.   MRN: [id number] 1 Day Post-Op  Subjective: Moderate general abd pain, better with meds, no nausea  Objective: Vital signs in last 24 hours: Temp:  [97.5 F (36.4 C)-98.2 F (36.8 C)] 98.1 F (36.7 C) (03/16 0454) Pulse Rate:  [70-85] 70 (03/16 0454) Resp:  [11-21] 21 (03/16 0824) BP: (89-124)/(48-72) 114/54 mmHg (03/16 0454) SpO2:  [91 %-100 %] 92 % (03/16 0824) Last BM Date: 03/25/14  Intake/Output from previous day: 03/15 0701 - 03/16 0700 In: 4250 [I.V.:4000; IV Piggyback:250] Out: 1090 [Urine:890; Blood:200] Intake/Output this shift:    General appearance: alert, cooperative and no distress Resp: No wheezing or increased work of breathing GI: binder in place, mild diffuse tenderness  Lab Results:   Recent Labs  03/27/14 0535  WBC 10.6*  HGB 10.6*  HCT 32.8*  PLT 209   BMET  Recent Labs  03/27/14 0535  NA 142  K 4.8  CL 104  CO2 26  GLUCOSE 127*  BUN 22  CREATININE 1.18*  CALCIUM 8.8     Studies/Results: No results found.  Anti-infectives: Anti-infectives    Start     Dose/Rate Route Frequency Ordered Stop   03/26/14 0943  ANCEF 1 gram in 0.9% normal saline 500 mL  Status:  Discontinued       As needed 03/26/14 0944 03/26/14 1119   03/26/14 0942  ANCEF 1 gram in 0.9% normal saline 500 mL  Status:  Discontinued       As needed 03/26/14 7793 03/26/14 1119   03/26/14 0539  ceFAZolin (ANCEF) IVPB 2 g/50 mL premix     2 g 100 mL/hr over 30 Minutes Intravenous On call to O.R. 03/26/14 0539 03/26/14 0736      Assessment/Plan: s/p Procedure(s): EXPLORATORY LAPAROTOMY TOTAL HYSTERECTOMY ABDOMINAL BILATERL SALPINGO OOPHORECTOMY OMENTECTOMY LYSIS OF ADHESION HERNIA REPAIR VENTRAL ADULT Stable post op.  BP a little soft and creatinine up slightly, prob slightly dry Will give bolus IVF, lab in AM.   CL diet   LOS: 1 day    Kamuela Magos T 03/27/2014

## 2014-03-27 NOTE — Progress Notes (Signed)
PACU Nsg Note: Chart Audit Performed per request of AD/DD

## 2014-03-28 DIAGNOSIS — K913 Postprocedural intestinal obstruction, unspecified as to partial versus complete: Secondary | ICD-10-CM | POA: Insufficient documentation

## 2014-03-28 LAB — BASIC METABOLIC PANEL
Anion gap: 11 (ref 5–15)
BUN: 23 mg/dL (ref 6–23)
CALCIUM: 8.8 mg/dL (ref 8.4–10.5)
CHLORIDE: 100 mmol/L (ref 96–112)
CO2: 30 mmol/L (ref 19–32)
Creatinine, Ser: 1.03 mg/dL (ref 0.50–1.10)
GFR calc Af Amer: 63 mL/min — ABNORMAL LOW (ref >90)
GFR calc non Af Amer: 55 mL/min — ABNORMAL LOW (ref >90)
Glucose, Bld: 112 mg/dL — ABNORMAL HIGH (ref 70–99)
Potassium: 3.7 mmol/L (ref 3.5–5.1)
Sodium: 141 mmol/L (ref 135–145)

## 2014-03-28 LAB — CBC
HCT: 33.8 % — ABNORMAL LOW (ref 36.0–46.0)
HEMOGLOBIN: 10.6 g/dL — AB (ref 12.0–15.0)
MCH: 28.9 pg (ref 26.0–34.0)
MCHC: 31.4 g/dL (ref 30.0–36.0)
MCV: 92.1 fL (ref 78.0–100.0)
PLATELETS: 202 10*3/uL (ref 150–400)
RBC: 3.67 MIL/uL — ABNORMAL LOW (ref 3.87–5.11)
RDW: 14.2 % (ref 11.5–15.5)
WBC: 5.2 10*3/uL (ref 4.0–10.5)

## 2014-03-28 MED ORDER — DIPHENHYDRAMINE HCL 50 MG/ML IJ SOLN: 12.5000 mg | Freq: Four times a day (QID) | INTRAMUSCULAR | Status: DC | PRN

## 2014-03-28 MED ORDER — SODIUM CHLORIDE 0.9 % IJ SOLN: 9.0000 mL | INTRAMUSCULAR | Status: DC | PRN

## 2014-03-28 MED ORDER — HYDROMORPHONE 0.3 MG/ML IV SOLN
INTRAVENOUS | Status: DC
  Administered 2014-03-28: 0.2 mg via INTRAVENOUS
  Administered 2014-03-28: 10:00:00 via INTRAVENOUS
  Administered 2014-03-28: 0.799 mg via INTRAVENOUS
  Administered 2014-03-28 – 2014-03-29 (×2): 0.999 mg via INTRAVENOUS
  Administered 2014-03-29: 1.19 mg via INTRAVENOUS
  Administered 2014-03-29: 0.999 mg via INTRAVENOUS
  Administered 2014-03-29: 0.6 mg via INTRAVENOUS
  Administered 2014-03-29: 0.999 mg via INTRAVENOUS
  Administered 2014-03-30: 1.99 mg via INTRAVENOUS
  Administered 2014-03-30: 0.799 mg via INTRAVENOUS
  Administered 2014-03-30: 1.19 mg via INTRAVENOUS
  Administered 2014-03-30: 0.599 mg via INTRAVENOUS
  Administered 2014-03-30: 2.79 mg via INTRAVENOUS
  Administered 2014-03-30: 0.799 mg via INTRAVENOUS
  Administered 2014-03-31: 0.999 mg via INTRAVENOUS
  Administered 2014-03-31 (×2): 1.19 mg via INTRAVENOUS
  Administered 2014-03-31: 1.3 mg via INTRAVENOUS
  Administered 2014-04-01: 1.39 mg via INTRAVENOUS
  Administered 2014-04-01: via INTRAVENOUS
  Administered 2014-04-01: 1.99 mg via INTRAVENOUS
  Filled 2014-03-28 (×4): qty 25

## 2014-03-28 MED ORDER — KCL IN DEXTROSE-NACL 20-5-0.9 MEQ/L-%-% IV SOLN
INTRAVENOUS | Status: DC
  Administered 2014-03-28 – 2014-04-01 (×10): via INTRAVENOUS
  Filled 2014-03-28 (×14): qty 1000

## 2014-03-28 MED ORDER — HYDROMORPHONE HCL 1 MG/ML IJ SOLN: 0.5000 mg | INTRAMUSCULAR | Status: DC | PRN

## 2014-03-28 MED ORDER — SODIUM CHLORIDE 0.9 % IV BOLUS (SEPSIS)
500.0000 mL | Freq: Once | INTRAVENOUS | Status: AC
  Administered 2014-03-28: 500 mL via INTRAVENOUS

## 2014-03-28 MED ORDER — BISACODYL 10 MG RE SUPP
10.0000 mg | Freq: Every day | RECTAL | Status: DC | PRN
  Administered 2014-03-28 – 2014-03-31 (×4): 10 mg via RECTAL
  Filled 2014-03-28 (×4): qty 1

## 2014-03-28 MED ORDER — DIPHENHYDRAMINE HCL 12.5 MG/5ML PO ELIX
12.5000 mg | ORAL_SOLUTION | Freq: Four times a day (QID) | ORAL | Status: DC | PRN
Start: 2014-03-28 — End: 2014-04-01

## 2014-03-28 MED ORDER — NALOXONE HCL 0.4 MG/ML IJ SOLN: 0.4000 mg | INTRAMUSCULAR | Status: DC | PRN

## 2014-03-28 MED ORDER — HYDROMORPHONE BOLUS VIA INFUSION: 0.5000 mg | INTRAVENOUS | Status: DC | PRN

## 2014-03-28 NOTE — Progress Notes (Signed)
Patient ID: [name], female   DOB: 04-01-1945, 69 y.o.   MRN: [id number] 2 Days Post-Op  Subjective: Having nausea this morning and a couple of episodes of emesis. Pain is not bad, well controlled with meds. No flatus or bowel movements.  Objective: Vital signs in last 24 hours: Temp:  [98.2 F (36.8 C)-98.9 F (37.2 C)] 98.3 F (36.8 C) (03/16 2143) Pulse Rate:  [64-75] 75 (03/16 2143) Resp:  [18] 18 (03/16 2143) BP: (101-113)/(55-62) 108/55 mmHg (03/16 2143) SpO2:  [91 %-94 %] 92 % (03/16 2143) Last BM Date: 03/25/14  Intake/Output from previous day: 03/16 0701 - 03/17 0700 In: 50 [I.V.:50] Out: 850 [Urine:850] Intake/Output this shift:    General appearance: alert, cooperative and no distress GI: moderate distention. Mild diffuse tenderness without guarding. Incision/Wound: clean and dry without erythema  Lab Results:   Recent Labs  03/27/14 0535 03/28/14 0523  WBC 10.6* 5.2  HGB 10.6* 10.6*  HCT 32.8* 33.8*  PLT 209 202   BMET  Recent Labs  03/27/14 0535  NA 142  K 4.8  CL 104  CO2 26  GLUCOSE 127*  BUN 22  CREATININE 1.18*  CALCIUM 8.8     Studies/Results: No results found.  Anti-infectives: Anti-infectives    Start     Dose/Rate Route Frequency Ordered Stop   03/26/14 0943  ANCEF 1 gram in 0.9% normal saline 500 mL  Status:  Discontinued       As needed 03/26/14 0944 03/26/14 1119   03/26/14 0942  ANCEF 1 gram in 0.9% normal saline 500 mL  Status:  Discontinued       As needed 03/26/14 7672 03/26/14 1119   03/26/14 0539  ceFAZolin (ANCEF) IVPB 2 g/50 mL premix     2 g 100 mL/hr over 30 Minutes Intravenous On call to O.R. 03/26/14 0539 03/26/14 0736      Assessment/Plan: s/p Procedure(s): EXPLORATORY LAPAROTOMY TOTAL HYSTERECTOMY ABDOMINAL BILATERL SALPINGO OOPHORECTOMY OMENTECTOMY LYSIS OF ADHESION HERNIA REPAIR VENTRAL ADULT Probable postoperative ileus. Nothing by mouth except ice chips and restart IV fluids. Ambulation  encouraged.   LOS: 2 days    Alvaretta Eisenberger T 03/28/2014

## 2014-03-28 NOTE — Progress Notes (Signed)
Patient ID: [name], female   DOB: 1945/08/07, 69 y.o.   MRN: [id number] 2 Days Post-Op Procedure(s) (LRB): EXPLORATORY LAPAROTOMY (N/A) TOTAL HYSTERECTOMY ABDOMINAL (N/A) BILATERL SALPINGO OOPHORECTOMY (Bilateral) OMENTECTOMY LYSIS OF ADHESION HERNIA REPAIR VENTRAL ADULT  Subjective: Patient reports:  No flatus/BM C/O: nausea and vomiting.    Objective: Vital signs in last 24 hours: Temp:  [98.2 F (36.8 C)-98.9 F (37.2 C)] 98.3 F (36.8 C) (03/16 2143) Pulse Rate:  [64-75] 75 (03/16 2143) Resp:  [18] 18 (03/16 2143) BP: (101-113)/(55-62) 108/55 mmHg (03/16 2143) SpO2:  [91 %-94 %] 92 % (03/16 2143) Last BM Date: 03/25/14  Intake/Output from previous day: 03/16 0701 - 03/17 0700 In: 50 [I.V.:50] Out: 850 [Urine:850]  Physical Examination: General: alert Resp: clear to auscultation bilaterally Cardio: regular rate and rhythm, S1, S2 normal, no murmur, click, rub or gallop GI: abnormal findings:  absent bowel sounds, distended and NT Extremities: extremities normal, atraumatic, no cyanosis or edema and Homans sign is negative, no sign of DVT I: C/D/I Labs: WBC/Hgb/Hct/Plts:  5.2/10.6/33.8/202 (03/17 8453)     Assessment:  69 y.o. s/p Procedure(s): EXPLORATORY LAPAROTOMY TOTAL HYSTERECTOMY ABDOMINAL BILATERL SALPINGO OOPHORECTOMY OMENTECTOMY LYSIS OF ADHESION HERNIA REPAIR VENTRAL ADULT: stable Pain:  The patient reports minimal pain  CV: Hypertension:  controlled borderline, low. Current treatment:  amlodipine (Norvasc), hydrochlorothiazide (HCTZ) and irbesartran (Avepro).  GI:  Tolerating po: No   Pt with ileus. Dr. Marquita Palms note appreciated. Treatment for N/V: zofran intravenous. FEN: Possible mild hypovolemia  Prophylaxis: pharmacologic prophylaxis (with any of the following: enoxaparin (Lovenox) 40mg  SQ 2 hours prior to surgery then every day) and intermittent pneumatic compression boots.  Plan: Encourage ambulation NPO Resume IV  fluids PCA Dulcolax suppository Hold diuretic for now    LOS: 2 days    JACKSON-MOORE,Chibuikem Thang A 03/28/2014, 9:14 AM

## 2014-03-28 NOTE — Progress Notes (Signed)
Pt had decreased UOP past 12 hrs. Pt  Tolerating only small amts of liquids.  Dr. Delsa Sale notified.  Order received.

## 2014-03-29 LAB — CBC
HCT: 33.5 % — ABNORMAL LOW (ref 36.0–46.0)
Hemoglobin: 10.8 g/dL — ABNORMAL LOW (ref 12.0–15.0)
MCH: 29.6 pg (ref 26.0–34.0)
MCHC: 32.2 g/dL (ref 30.0–36.0)
MCV: 91.8 fL (ref 78.0–100.0)
Platelets: UNDETERMINED 10*3/uL (ref 150–400)
RBC: 3.65 MIL/uL — AB (ref 3.87–5.11)
RDW: 14 % (ref 11.5–15.5)
WBC: 3.6 10*3/uL — AB (ref 4.0–10.5)

## 2014-03-29 LAB — BASIC METABOLIC PANEL
ANION GAP: 10 (ref 5–15)
BUN: 17 mg/dL (ref 6–23)
CO2: 27 mmol/L (ref 19–32)
CREATININE: 0.83 mg/dL (ref 0.50–1.10)
Calcium: 8.5 mg/dL (ref 8.4–10.5)
Chloride: 104 mmol/L (ref 96–112)
GFR calc non Af Amer: 71 mL/min — ABNORMAL LOW (ref >90)
GFR, EST AFRICAN AMERICAN: 82 mL/min — AB (ref >90)
Glucose, Bld: 136 mg/dL — ABNORMAL HIGH (ref 70–99)
POTASSIUM: 4.4 mmol/L (ref 3.5–5.1)
SODIUM: 141 mmol/L (ref 135–145)

## 2014-03-29 MED ORDER — PANTOPRAZOLE SODIUM 40 MG IV SOLR
40.0000 mg | INTRAVENOUS | Status: DC
  Administered 2014-03-29 – 2014-03-31 (×3): 40 mg via INTRAVENOUS
  Filled 2014-03-29 (×5): qty 40

## 2014-03-29 NOTE — Progress Notes (Signed)
3 Days Post-Op Procedure(s) (LRB): EXPLORATORY LAPAROTOMY (N/A) TOTAL HYSTERECTOMY ABDOMINAL (N/A) BILATERL SALPINGO OOPHORECTOMY (Bilateral) OMENTECTOMY LYSIS OF ADHESION HERNIA REPAIR VENTRAL ADULT  Subjective: Patient reports "coughing and bringing up fluid."  Reporting reflux that has slightly improved since receiving IV protonix.  No episodes of emesis reported.  Belching intermittently.  Adequate pain relief with PCA use.  Ambulating without difficulty.  Denies chest pain, dyspnea, passing flatus, or having a bowel movement.  No concerns voiced.    Objective: Vital signs in last 24 hours: Temp:  [98 F (36.7 C)-98.5 F (36.9 C)] 98.2 F (36.8 C) (03/18 1400) Pulse Rate:  [72-79] 72 (03/18 1400) Resp:  [12-18] 13 (03/18 1522) BP: (112-147)/(63-76) 121/69 mmHg (03/18 1400) SpO2:  [93 %-97 %] 95 % (03/18 1522) FiO2 (%):  [36 %-42 %] 36 % (03/18 0450) Last BM Date: 03/29/14  Intake/Output from previous day: 03/17 0701 - 03/18 0700 In: 2568.8 [I.V.:2068.8; IV Piggyback:500] Out: 1078 [Urine:1075; Stool:3]  Physical Examination: General: alert, cooperative and no distress Resp: clear to auscultation bilaterally Cardio: systolic murmur noted, regular rate and rhythm noted GI: incision: dressing intact, midline incision with staples without drainage and abdomen distended, faint bowel sounds-more in lower quadrants, tympanic on percussion, binder in place Extremities: extremities normal, atraumatic, no cyanosis or edema  Labs: WBC/Hgb/Hct/Plts:  3.6/10.8/33.5/PLATELET CLUMPS NOTED ON SMEAR, UNABLE TO ESTIMATE (03/18 0545) BUN/Cr/glu/ALT/AST/amyl/lip:  17/0.83/--/--/--/--/-- (03/18 0545)  Assessment: 69 y.o. 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  Heme: Hgb 10.8 and Hct 33.5 this am.  Stable  post-operatively.  CV: BP and HR stable post-operatively.  Continue to monitor with ordered vital signs.  Amlodipine/Valsartan/HCTZ ordered.  GI:  Tolerating po: No: NPO.     GU:  Adequate output reported.    FEN: Stable post-operatively.  Prophylaxis: pharmacologic prophylaxis (with any of the following: enoxaparin (Lovenox) 40mg  SQ 2 hours prior to surgery then every day) and intermittent pneumatic compression boots.  Plan: Maintain NPO status AM labs Encourage ambulation, IS use, deep breathing, and coughing Continue post-operative plan of care per Dr. Delsa Sale and Dr. Excell Seltzer   LOS: 3 days    CROSS, MELISSA DEAL 03/29/2014, 3:58 PM

## 2014-03-29 NOTE — Progress Notes (Signed)
Patient ID: [name], female   DOB: Nov 01, 1945, 69 y.o.   MRN: [id number] 3 Days Post-Op  Subjective: Having regurgitation of fluid with nausea but no overt vomiting. Feels weak. No significant pain, just sore when moving around.  Objective: Vital signs in last 24 hours: Temp:  [98 F (36.7 C)-98.5 F (36.9 C)] 98 F (36.7 C) (03/18 0536) Pulse Rate:  [70-79] 79 (03/18 0536) Resp:  [14-23] 18 (03/18 0536) BP: (112-147)/(61-76) 147/63 mmHg (03/18 0536) SpO2:  [91 %-97 %] 96 % (03/18 0536) FiO2 (%):  [36 %-42 %] 36 % (03/18 0450) Last BM Date: 03/29/14  Intake/Output from previous day: 03/17 0701 - 03/18 0700 In: 2568.8 [I.V.:2068.8; IV Piggyback:500] Out: 1078 [Urine:1075; Stool:3] Intake/Output this shift:    General appearance: alert, cooperative and mild distress GI: no significant tenderness. Bowel sounds hypoactive. Mild distention. Incision/Wound: clean and dry without erythema or drainage  Lab Results:   Recent Labs  03/28/14 0523 03/29/14 0545  WBC 5.2 3.6*  HGB 10.6* 10.8*  HCT 33.8* 33.5*  PLT 202 PLATELET CLUMPS NOTED ON SMEAR, UNABLE TO ESTIMATE   BMET  Recent Labs  03/28/14 0940 03/29/14 0545  NA 141 141  K 3.7 4.4  CL 100 104  CO2 30 27  GLUCOSE 112* 136*  BUN 23 17  CREATININE 1.03 0.83  CALCIUM 8.8 8.5     Studies/Results: No results found.  Anti-infectives: Anti-infectives    Start     Dose/Rate Route Frequency Ordered Stop   03/26/14 0943  ANCEF 1 gram in 0.9% normal saline 500 mL  Status:  Discontinued       As needed 03/26/14 0944 03/26/14 1119   03/26/14 0942  ANCEF 1 gram in 0.9% normal saline 500 mL  Status:  Discontinued       As needed 03/26/14 1443 03/26/14 1119   03/26/14 0539  ceFAZolin (ANCEF) IVPB 2 g/50 mL premix     2 g 100 mL/hr over 30 Minutes Intravenous On call to O.R. 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 I discussed NG tube the patient which might make her more comfortable but she does not want this at this time. Ambulation and out of bed to chair encouraged. Continue nothing by mouth. Changed to IV Protonix.   LOS: 3 days    [name] T 03/29/2014

## 2014-03-30 LAB — BASIC METABOLIC PANEL
ANION GAP: 8 (ref 5–15)
BUN: 16 mg/dL (ref 6–23)
CALCIUM: 8.7 mg/dL (ref 8.4–10.5)
CHLORIDE: 106 mmol/L (ref 96–112)
CO2: 30 mmol/L (ref 19–32)
Creatinine, Ser: 0.79 mg/dL (ref 0.50–1.10)
GFR, EST NON AFRICAN AMERICAN: 84 mL/min — AB (ref >90)
GLUCOSE: 132 mg/dL — AB (ref 70–99)
POTASSIUM: 4.2 mmol/L (ref 3.5–5.1)
Sodium: 144 mmol/L (ref 135–145)

## 2014-03-30 LAB — TYPE AND SCREEN
ABO/RH(D): A NEG
ANTIBODY SCREEN: POSITIVE
DAT, IGG: NEGATIVE
Unit division: 0
Unit division: 0

## 2014-03-30 LAB — CBC
HCT: 30.7 % — ABNORMAL LOW (ref 36.0–46.0)
Hemoglobin: 9.8 g/dL — ABNORMAL LOW (ref 12.0–15.0)
MCH: 29.5 pg (ref 26.0–34.0)
MCHC: 31.9 g/dL (ref 30.0–36.0)
MCV: 92.5 fL (ref 78.0–100.0)
PLATELETS: 225 10*3/uL (ref 150–400)
RBC: 3.32 MIL/uL — AB (ref 3.87–5.11)
RDW: 14.1 % (ref 11.5–15.5)
WBC: 3.4 10*3/uL — AB (ref 4.0–10.5)

## 2014-03-30 NOTE — Progress Notes (Signed)
Patient ID: [name], female   DOB: 19-May-1945, 69 y.o.   MRN: [id number] Patient ID: [name], female   DOB: January 13, 1945, 69 y.o.   MRN: [id number] 4 Days Post-Op Procedure(s) (LRB): EXPLORATORY LAPAROTOMY (N/A) TOTAL HYSTERECTOMY ABDOMINAL (N/A) BILATERL SALPINGO OOPHORECTOMY (Bilateral) OMENTECTOMY LYSIS OF ADHESION HERNIA REPAIR VENTRAL ADULT  Subjective: Patient reports: less emesis, BM+.    Objective: Vital signs in last 24 hours: Temp:  [97.8 F (36.6 C)-98.2 F (36.8 C)] 97.8 F (36.6 C) (03/19 0610) Pulse Rate:  [72-79] 79 (03/19 0610) Resp:  [12-20] 19 (03/19 0610) BP: (121-128)/(62-69) 128/62 mmHg (03/19 0610) SpO2:  [88 %-95 %] 92 % (03/19 0610) FiO2 (%):  [40 %-45 %] 45 % (03/19 0444) Last BM Date: 03/29/14  Intake/Output from previous day: 03/18 0701 - 03/19 0700 In: 2500 [I.V.:2500] Out: 850 [Urine:850]  Physical Examination: General: alert Resp: clear to auscultation bilaterally Cardio: regular rate and rhythm, S1, S2 normal, no murmur, click, rub or gallop GI: abnormal findings:  hypoactive bowel sounds, less distended and NT Extremities: extremities normal, atraumatic, no cyanosis or edema and Homans sign is negative, no sign of DVT I: C/D/I Labs: WBC/Hgb/Hct/Plts:  3.4/9.8/30.7/225 (03/19 0430) BUN/Cr/glu/ALT/AST/amyl/lip:  16/0.79/--/--/--/--/-- (03/19 0430)   Assessment:  69 y.o. s/p Procedure(s): EXPLORATORY LAPAROTOMY TOTAL HYSTERECTOMY ABDOMINAL BILATERL SALPINGO OOPHORECTOMY OMENTECTOMY LYSIS OF ADHESION HERNIA REPAIR VENTRAL ADULT: stable Pain:  The patient reports minimal pain  CV: Hypertension:  controlled  Current treatment:  amlodipine (Norvasc) and irbesartran (Avepro).  GI:  Tolerating po: No   Pt with ileus. Improving.  Treatment for N/V: zofran intravenous. FEN: Electrolytes in range  Prophylaxis: pharmacologic prophylaxis (with any of the following: enoxaparin (Lovenox) 40mg  SQ 2 hours prior to surgery then every day) and  intermittent pneumatic compression boots.  Plan: Encourage ambulation NPO Continue IV fluids PCA Dulcolax suppository Daily weights    LOS: 4 days    JACKSON-MOORE,Alejandra Barna A 03/30/2014, 9:14 AM

## 2014-03-30 NOTE — Progress Notes (Signed)
Patient ID: [name], female   DOB: [birth year], 69 y.o.   MRN: [id number] 4 Days Post-Op  Subjective: Feels better than yesterday. Still some occasional nausea and distention but no longer having constant regurgitation. Had a small bowel movement. Pain well controlled with minimal medication.  Objective: Vital signs in last 24 hours: Temp:  [97.8 F (36.6 C)-98.2 F (36.8 C)] 97.8 F (36.6 C) (03/19 0610) Pulse Rate:  [72-79] 79 (03/19 0610) Resp:  [12-20] 19 (03/19 0610) BP: (121-128)/(62-69) 128/62 mmHg (03/19 0610) SpO2:  [88 %-95 %] 92 % (03/19 0610) FiO2 (%):  [40 %-45 %] 45 % (03/19 0444) Last BM Date: 03/29/14  Intake/Output from previous day: 03/18 0701 - 03/19 0700 In: 2500 [I.V.:2500] Out: 850 [Urine:850] Intake/Output this shift:    General appearance: alert, cooperative, no distress and appears much more comfortable than yesterday GI: moderately distended and tympanitic, rare bowel sounds, nontender Incision/Wound: nno erythema or drainage  Lab Results:   Recent Labs  03/29/14 0545 03/30/14 0430  WBC 3.6* 3.4*  HGB 10.8* 9.8*  HCT 33.5* 30.7*  PLT PLATELET CLUMPS NOTED ON SMEAR, UNABLE TO ESTIMATE 225   BMET  Recent Labs  03/29/14 0545 03/30/14 0430  NA 141 144  K 4.4 4.2  CL 104 106  CO2 27 30  GLUCOSE 136* 132*  BUN 17 16  CREATININE 0.83 0.79  CALCIUM 8.5 8.7     Studies/Results: No results found.  Anti-infectives: Anti-infectives    Start     Dose/Rate Route Frequency Ordered Stop   03/26/14 0943  ANCEF 1 gram in 0.9% normal saline 500 mL  Status:  Discontinued       As needed 03/26/14 0944 03/26/14 1119   03/26/14 0942  ANCEF 1 gram in 0.9% normal saline 500 mL  Status:  Discontinued       As needed 03/26/14 2725 03/26/14 1119   03/26/14 0539  ceFAZolin (ANCEF) IVPB 2 g/50 mL premix     2 g 100 mL/hr over 30 Minutes Intravenous On call to O.R. 03/26/14 0539 03/26/14 0736      Assessment/Plan: s/p Procedure(s): EXPLORATORY  LAPAROTOMY TOTAL HYSTERECTOMY ABDOMINAL BILATERL SALPINGO OOPHORECTOMY OMENTECTOMY LYSIS OF ADHESION HERNIA REPAIR VENTRAL ADULT Not unexpected postoperative ileus that seems slightly improved today. Continue nothing by mouth. Ambulation encouraged.    LOS: 4 days    [name] T 03/30/2014

## 2014-03-31 LAB — CBC
HCT: 31.6 % — ABNORMAL LOW (ref 36.0–46.0)
HEMOGLOBIN: 10 g/dL — AB (ref 12.0–15.0)
MCH: 29.4 pg (ref 26.0–34.0)
MCHC: 31.6 g/dL (ref 30.0–36.0)
MCV: 92.9 fL (ref 78.0–100.0)
Platelets: 265 10*3/uL (ref 150–400)
RBC: 3.4 MIL/uL — ABNORMAL LOW (ref 3.87–5.11)
RDW: 14.1 % (ref 11.5–15.5)
WBC: 5.1 10*3/uL (ref 4.0–10.5)

## 2014-03-31 LAB — BASIC METABOLIC PANEL
Anion gap: 8 (ref 5–15)
BUN: 15 mg/dL (ref 6–23)
CO2: 29 mmol/L (ref 19–32)
Calcium: 8.8 mg/dL (ref 8.4–10.5)
Chloride: 106 mmol/L (ref 96–112)
Creatinine, Ser: 0.81 mg/dL (ref 0.50–1.10)
GFR, EST AFRICAN AMERICAN: 85 mL/min — AB (ref >90)
GFR, EST NON AFRICAN AMERICAN: 73 mL/min — AB (ref >90)
GLUCOSE: 135 mg/dL — AB (ref 70–99)
Potassium: 5.1 mmol/L (ref 3.5–5.1)
Sodium: 143 mmol/L (ref 135–145)

## 2014-03-31 NOTE — Progress Notes (Signed)
Patient ID: [name], female   DOB: 1945/10/28, 69 y.o.   MRN: [id number] 5 Days Post-Op  Subjective: Feels much better today. Nausea has resolved. She had a small bowel movement. Pain is improving.  Objective: Vital signs in last 24 hours: Temp:  [97.8 F (36.6 C)-98.8 F (37.1 C)] 98.5 F (36.9 C) (03/20 0539) Pulse Rate:  [68-81] 74 (03/20 0539) Resp:  [14-21] 14 (03/20 0854) BP: (125-138)/(68-74) 138/68 mmHg (03/20 0539) SpO2:  [94 %-98 %] 97 % (03/20 0854) Weight:  [94.3 kg (207 lb 14.3 oz)] 94.3 kg (207 lb 14.3 oz) (03/20 0539) Last BM Date: 03/29/14  Intake/Output from previous day: 03/19 0701 - 03/20 0700 In: 2937.5 [I.V.:2937.5] Out: 300 [Urine:300] Intake/Output this shift:    General appearance: alert, cooperative and no distress GI: she continues to have some significant upper abdominal distention and tympany. Nontender. Incision/Wound: no erythema or  purulent drainage  Lab Results:   Recent Labs  03/30/14 0430 03/31/14 0624  WBC 3.4* 5.1  HGB 9.8* 10.0*  HCT 30.7* 31.6*  PLT 225 265   BMET  Recent Labs  03/30/14 0430 03/31/14 0624  NA 144 143  K 4.2 5.1  CL 106 106  CO2 30 29  GLUCOSE 132* 135*  BUN 16 15  CREATININE 0.79 0.81  CALCIUM 8.7 8.8     Studies/Results: No results found.  Anti-infectives: Anti-infectives    Start     Dose/Rate Route Frequency Ordered Stop   03/26/14 0943  ANCEF 1 gram in 0.9% normal saline 500 mL  Status:  Discontinued       As needed 03/26/14 0944 03/26/14 1119   03/26/14 0942  ANCEF 1 gram in 0.9% normal saline 500 mL  Status:  Discontinued       As needed 03/26/14 9794 03/26/14 1119   03/26/14 0539  ceFAZolin (ANCEF) IVPB 2 g/50 mL premix     2 g 100 mL/hr over 30 Minutes Intravenous On call to O.R. 03/26/14 0539 03/26/14 0736      Assessment/Plan: s/p Procedure(s): EXPLORATORY LAPAROTOMY TOTAL HYSTERECTOMY ABDOMINAL BILATERL SALPINGO OOPHORECTOMY OMENTECTOMY LYSIS OF ADHESION HERNIA REPAIR  VENTRAL ADULT Significantly improved today. She still is distended on exam however. Overall I think her ileus is improving. We will start a limited clear liquid diet. She has been ambulating and this is encouraged.   LOS: 5 days    Daouda Lonzo T 03/31/2014

## 2014-03-31 NOTE — Progress Notes (Signed)
Patient ID: [name], female   DOB: [birth year], 69 y.o.   MRN: [id number] 5 Days Post-Op Procedure(s) (LRB): EXPLORATORY LAPAROTOMY (N/A) TOTAL HYSTERECTOMY ABDOMINAL (N/A) BILATERL SALPINGO OOPHORECTOMY (Bilateral) OMENTECTOMY LYSIS OF ADHESION HERNIA REPAIR VENTRAL ADULT  Subjective: Patient reports: BM+.    Objective: Vital signs in last 24 hours: Temp:  [97.8 F (36.6 C)-98.8 F (37.1 C)] 98.5 F (36.9 C) (03/20 0539) Pulse Rate:  [68-81] 74 (03/20 0539) Resp:  [14-21] 14 (03/20 0854) BP: (125-138)/(68-74) 138/68 mmHg (03/20 0539) SpO2:  [94 %-98 %] 97 % (03/20 0854) Weight:  [207 lb 14.3 oz (94.3 kg)] 207 lb 14.3 oz (94.3 kg) (03/20 0539) Last BM Date: 03/29/14  Intake/Output from previous day: 03/19 0701 - 03/20 0700 In: 2937.5 [I.V.:2937.5] Out: 300 [Urine:300]  Physical Examination: General: alert Resp: clear to auscultation bilaterally Cardio: regular rate and rhythm, S1, S2 normal, no murmur, click, rub or gallop GI: abnormal findings:  hypoactive bowel sounds, less distended and NT Extremities: extremities normal, atraumatic, no cyanosis or edema and Homans sign is negative, no sign of DVT I: C/D/I Labs: WBC/Hgb/Hct/Plts:  5.1/10.0/31.6/265 (03/20 4315) BUN/Cr/glu/ALT/AST/amyl/lip:  15/0.81/--/--/--/--/-- (03/20 4008)   Assessment:  69 y.o. s/p Procedure(s): EXPLORATORY LAPAROTOMY TOTAL HYSTERECTOMY ABDOMINAL BILATERL SALPINGO OOPHORECTOMY OMENTECTOMY LYSIS OF ADHESION HERNIA REPAIR VENTRAL ADULT: stable Pain:  The patient reports minimal pain  CV: Hypertension:  controlled  Current treatment:  amlodipine (Norvasc) and irbesartran (Avepro).  GI:  Tolerating po: clears started today   Pt with ileus. Improving.  Treatment for N/V: zofran intravenous. FEN: Electrolytes in range  Prophylaxis: pharmacologic prophylaxis (with any of the following: enoxaparin (Lovenox) 40mg  SQ 2 hours prior to surgery then every day) and intermittent pneumatic  compression boots.  Plan: Encourage ambulation Clears Continue IV fluids--if tolerates clears-->D/C PCA, saline lock IV Dulcolax suppository     LOS: 5 days    JACKSON-MOORE,Saron Tweed A 03/31/2014, 10:57 AM

## 2014-04-01 ENCOUNTER — Encounter (HOSPITAL_COMMUNITY): Payer: Self-pay | Admitting: Radiology

## 2014-04-01 ENCOUNTER — Inpatient Hospital Stay (HOSPITAL_COMMUNITY): Payer: Medicare Other

## 2014-04-01 DIAGNOSIS — K913 Postprocedural intestinal obstruction: Secondary | ICD-10-CM

## 2014-04-01 DIAGNOSIS — R0602 Shortness of breath: Secondary | ICD-10-CM

## 2014-04-01 LAB — BASIC METABOLIC PANEL
Anion gap: 8 (ref 5–15)
BUN: 13 mg/dL (ref 6–23)
CHLORIDE: 112 mmol/L (ref 96–112)
CO2: 24 mmol/L (ref 19–32)
Calcium: 9.3 mg/dL (ref 8.4–10.5)
Creatinine, Ser: 0.85 mg/dL (ref 0.50–1.10)
GFR calc Af Amer: 80 mL/min — ABNORMAL LOW (ref >90)
GFR calc non Af Amer: 69 mL/min — ABNORMAL LOW (ref >90)
GLUCOSE: 117 mg/dL — AB (ref 70–99)
POTASSIUM: 5.1 mmol/L (ref 3.5–5.1)
SODIUM: 144 mmol/L (ref 135–145)

## 2014-04-01 LAB — CBC
HCT: 30.9 % — ABNORMAL LOW (ref 36.0–46.0)
Hemoglobin: 9.9 g/dL — ABNORMAL LOW (ref 12.0–15.0)
MCH: 29.6 pg (ref 26.0–34.0)
MCHC: 32 g/dL (ref 30.0–36.0)
MCV: 92.2 fL (ref 78.0–100.0)
Platelets: 293 10*3/uL (ref 150–400)
RBC: 3.35 MIL/uL — ABNORMAL LOW (ref 3.87–5.11)
RDW: 14 % (ref 11.5–15.5)
WBC: 8.1 10*3/uL (ref 4.0–10.5)

## 2014-04-01 MED ORDER — ALPRAZOLAM 0.25 MG PO TABS
0.2500 mg | ORAL_TABLET | Freq: Two times a day (BID) | ORAL | Status: DC | PRN
  Administered 2014-04-01 – 2014-04-04 (×5): 0.25 mg via ORAL
  Filled 2014-04-01 (×5): qty 1

## 2014-04-01 MED ORDER — PANTOPRAZOLE SODIUM 40 MG PO TBEC
40.0000 mg | DELAYED_RELEASE_TABLET | Freq: Every day | ORAL | Status: DC
  Administered 2014-04-01 – 2014-04-05 (×5): 40 mg via ORAL
  Filled 2014-04-01 (×5): qty 1

## 2014-04-01 MED ORDER — IOHEXOL 350 MG/ML SOLN
100.0000 mL | Freq: Once | INTRAVENOUS | Status: AC | PRN
  Administered 2014-04-01: 100 mL via INTRAVENOUS

## 2014-04-01 MED ORDER — TRAMADOL HCL 50 MG PO TABS
50.0000 mg | ORAL_TABLET | Freq: Four times a day (QID) | ORAL | Status: DC | PRN
  Administered 2014-04-01 (×2): 100 mg via ORAL
  Administered 2014-04-02 – 2014-04-03 (×3): 50 mg via ORAL
  Administered 2014-04-03 (×2): 100 mg via ORAL
  Administered 2014-04-04: 50 mg via ORAL
  Filled 2014-04-01: qty 2
  Filled 2014-04-01 (×2): qty 1
  Filled 2014-04-01: qty 2
  Filled 2014-04-01: qty 1
  Filled 2014-04-01 (×2): qty 2
  Filled 2014-04-01: qty 1
  Filled 2014-04-01: qty 2

## 2014-04-01 MED ORDER — AMLODIPINE-VALSARTAN-HCTZ 10-320-25 MG PO TABS: 1.0000 | ORAL_TABLET | Freq: Every morning | ORAL | Status: DC

## 2014-04-01 MED ORDER — HYDROCHLOROTHIAZIDE 25 MG PO TABS
25.0000 mg | ORAL_TABLET | Freq: Every day | ORAL | Status: DC
  Administered 2014-04-01 – 2014-04-05 (×5): 25 mg via ORAL
  Filled 2014-04-01 (×5): qty 1

## 2014-04-01 MED ORDER — ACETAMINOPHEN 500 MG PO TABS
1000.0000 mg | ORAL_TABLET | Freq: Four times a day (QID) | ORAL | Status: DC
Start: 2014-04-01 — End: 2014-04-02
  Administered 2014-04-01 – 2014-04-02 (×5): 1000 mg via ORAL
  Filled 2014-04-01 (×8): qty 2

## 2014-04-01 MED ORDER — IBUPROFEN 600 MG PO TABS
600.0000 mg | ORAL_TABLET | Freq: Three times a day (TID) | ORAL | Status: DC
  Administered 2014-04-01 – 2014-04-02 (×4): 600 mg via ORAL
  Filled 2014-04-01 (×3): qty 1
  Filled 2014-04-01: qty 3
  Filled 2014-04-01 (×2): qty 1
  Filled 2014-04-01: qty 3
  Filled 2014-04-01: qty 1

## 2014-04-01 MED ORDER — ENSURE COMPLETE PO LIQD
237.0000 mL | Freq: Two times a day (BID) | ORAL | Status: DC
  Administered 2014-04-01: 237 mL via ORAL

## 2014-04-01 NOTE — Progress Notes (Signed)
Spoke with RN about patient's anxious state after receiving a call requesting an order for anti-anxiety medication.  RN reporting the patient being moderately short of breath with ambulation and becoming increasingly anxious after her walk. O2 sats at 95-96 on 3L. RN also reporting several episodes of desaturations over the weekend in the 80s and high 70s when patient off O2. No significant pulmonary history and not O2 dependent at home. Chest xray and labs reviewed. Situation discussed with Dr. Delsa Sale. New orders for EKG and CT angiogram to rule out PE. [facility] to be consulted today as well.

## 2014-04-01 NOTE — Progress Notes (Signed)
Telephone order received from Joylene John NP for stat EKG and CT Chest Angiogram to rule out pulmonary embolism, orders entered Neta Mends RN 04-01-2014 14:34pm

## 2014-04-01 NOTE — Progress Notes (Signed)
Patient ID: [name], female   DOB: 1945-05-26, 69 y.o.   MRN: [id number] 6 Days Post-Op  Subjective: Feels gradually a little better but abdomen is still bloated and tight. Feels a little short of breath at times possibly related to abdominal distention. No pain at rest, just some discomfort when getting up and down. No nausea or vomiting. Not really passing much flatus.  Objective: Vital signs in last 24 hours: Temp:  [98.2 F (36.8 C)-98.6 F (37 C)] 98.6 F (37 C) (03/21 0558) Pulse Rate:  [72-94] 80 (03/21 0558) Resp:  [14-25] 22 (03/21 0800) BP: (128-150)/(68-90) 150/78 mmHg (03/21 0558) SpO2:  [95 %-100 %] 97 % (03/21 0800) Weight:  [94.394 kg (208 lb 1.6 oz)] 94.394 kg (208 lb 1.6 oz) (03/21 0500) Last BM Date: 03/31/14  Intake/Output from previous day: 03/20 0701 - 03/21 0700 In: 3062.5 [I.V.:3062.5] Out: 1400 [Urine:1400] Intake/Output this shift:    General appearance: alert, cooperative and no distress GI: remains  distended particularly in the upper abdomen and tympanitic. Nontender. Wounds clean and dry.  Lab Results:   Recent Labs  03/30/14 0430 03/31/14 0624  WBC 3.4* 5.1  HGB 9.8* 10.0*  HCT 30.7* 31.6*  PLT 225 265   BMET  Recent Labs  03/30/14 0430 03/31/14 0624  NA 144 143  K 4.2 5.1  CL 106 106  CO2 30 29  GLUCOSE 132* 135*  BUN 16 15  CREATININE 0.79 0.81  CALCIUM 8.7 8.8     Studies/Results: No results found.  Anti-infectives: Anti-infectives    Start     Dose/Rate Route Frequency Ordered Stop   03/26/14 0943  ANCEF 1 gram in 0.9% normal saline 500 mL  Status:  Discontinued       As needed 03/26/14 0944 03/26/14 1119   03/26/14 0942  ANCEF 1 gram in 0.9% normal saline 500 mL  Status:  Discontinued       As needed 03/26/14 8592 03/26/14 1119   03/26/14 0539  ceFAZolin (ANCEF) IVPB 2 g/50 mL premix     2 g 100 mL/hr over 30 Minutes Intravenous On call to O.R. 03/26/14 0539 03/26/14 0736      Assessment/Plan: s/p  Procedure(s): EXPLORATORY LAPAROTOMY TOTAL HYSTERECTOMY ABDOMINAL BILATERL SALPINGO OOPHORECTOMY OMENTECTOMY LYSIS OF ADHESION HERNIA REPAIR VENTRAL ADULT Suspect ileus. Cannot rule out obstruction. I would limit to clear liquids only for now. We will check abdominal x-rays today.   LOS: 6 days    Naima Veldhuizen T 04/01/2014

## 2014-04-01 NOTE — Consult Note (Signed)
Requesting physician: Lahoma Crocker, MD  Reason for consultation: Shortness of breath  History of Present Illness: 69 year old female with history of hypertension, hyperlipidemia, coronary artery disease with history of stent, CABG who underwent exploratory laparotomy with total abdominal hysterectomy, bilateral salpingo-oophorectomy for complex adnexal mass, omentectomy, lysis of adhesions and ventral hernia repair on 3/15 ( jointly by GYN oncology and [facility] surgery). Patient tolerated surgery well and was placed on Dilaudid PCA. For past 3 days patient has been having shortness of breath. Patient reports having difficulty catching breath and was noted to be tachypneic. She denies any chest pain or palpitations. Denies any headache, blurred vision, dizziness, nausea, vomiting, fever, chills, bowel or urinary symptoms. Has some abdominal and distention. Her dyspnea is worsened when she is lying supine. She has been passing gas and has scanty bowel movement. Patient reports swelling and tightness in her left leg  Patient was found to be increasingly short of breath today. O2 sat was normal on room air. A chest x-ray was done which showed mild atelectasis. PCA was discontinued and narcotic was limited. KUB was done which showed distended bowel loops with air-fluid level in the right abdomen highly suspicious for small bowel obstruction versus ileus. Hospitalist consulted for evaluation and recommendation.   Allergies:  No Known Allergies    Past Medical History  Diagnosis Date  . HTN (hypertension)   . Hyperlipidemia   . CAD (coronary artery disease)     2009 RCA occluded proximally, LAD occluded proximally, left main 90% stenosis, LIMA to the LAD widely patent, SVG to OM and diagonal patent, SVG RCA patent. This stenosis after the anastomosis of 80% in the native vessel. This was treated with PCI.  Marland Kitchen Collagen vascular disease     Past Surgical History  Procedure Laterality Date  .  Coronary artery bypass graft  1994    LIMA to the LAD, SVG to RCA, SVG sequential to OM and diagonal,  . Abdominal aortic aneurysm repair  2003  . Tonsillectomy    . Colon surgery  2007    blockage  . Ventral hernia repair  03/26/2014    Procedure: HERNIA REPAIR VENTRAL ADULT;  Surgeon: Excell Seltzer, MD;  Location: [facility];  Service: General;;  With MESH  . Laparotomy N/A 03/26/2014    Procedure: EXPLORATORY LAPAROTOMY;  Surgeon: Everitt Amber, MD;  Location: [facility];  Service: Gynecology;  Laterality: N/A;  . Abdominal hysterectomy N/A 03/26/2014    Procedure: TOTAL HYSTERECTOMY ABDOMINAL;  Surgeon: Everitt Amber, MD;  Location: [facility];  Service: Gynecology;  Laterality: N/A;  . Salpingoophorectomy Bilateral 03/26/2014    Procedure: BILATERL SALPINGO OOPHORECTOMY;  Surgeon: Everitt Amber, MD;  Location: [facility];  Service: Gynecology;  Laterality: Bilateral;  . Omentectomy  03/26/2014    Procedure: OMENTECTOMY;  Surgeon: Everitt Amber, MD;  Location: [facility];  Service: Gynecology;;  . Lysis of adhesion  03/26/2014    Procedure: LYSIS OF ADHESION;  Surgeon: Everitt Amber, MD;  Location: [facility];  Service: Gynecology;;    Medications:  Scheduled Meds: . acetaminophen  1,000 mg Oral QID  . amLODipine  10 mg Oral Daily   And  . irbesartan  300 mg Oral Daily  . aspirin  81 mg Oral q morning - 10a  . colesevelam  1,875 mg Oral BID  . enoxaparin (LOVENOX) injection  40 mg Subcutaneous Q24H  . escitalopram  20 mg Oral q morning - 10a  . ezetimibe  10 mg Oral q morning - 10a  .  feeding supplement (ENSURE COMPLETE)  237 mL Oral BID BM  . hydrochlorothiazide  25 mg Oral Daily  . ibuprofen  600 mg Oral TID  . pantoprazole  40 mg Oral Daily  . rosuvastatin  40 mg Oral q1800   Continuous Infusions:  PRN Meds:.ALPRAZolam, bisacodyl, ondansetron (ZOFRAN) IV, traMADol  Social History:  reports that she has been smoking Cigarettes.  She does not have any smokeless tobacco history on file. She reports that she  does not drink alcohol or use illicit drugs.  Family History  Problem Relation Age of Onset  . CAD Father 18    Died of MI  . Hypertension Brother   . Cancer Sister     Ovarian    Review of Systems:  Constitutional: Denies fever, chills, diaphoresis, appetite change and fatigue.  HEENT: Denies visual or hearing symptoms, congestion, difficulty swallowing, neck pain line Respiratory: Denies SOB, DOE, denies cough, chest tightness,  and wheezing.   Cardiovascular: Denies chest pain, palpitations , leg swelling+.  Gastrointestinal: Abdominal distention, mild abdominal pain, Denies nausea, vomiting, abdominal pain, diarrhea, constipation, blood in stool  Genitourinary: Denies dysuria, urgency, frequency, hematuria, flank pain and difficulty urinating.  Endocrine: Denies: hot or cold intolerance,polyuria, polydipsia. Musculoskeletal: Denies myalgias, back pain, joint or swelling Skin: Denies , rash and wound.  Neurological: Denies dizziness, syncope, weakness, light-headedness, numbness and headaches.  Hematological: Denies adenopathy.  Psychiatric/Behavioral: Denies confusion  Physical Exam:  Filed Vitals:   04/01/14 0558 04/01/14 0800 04/01/14 1208 04/01/14 1400  BP: 150/78   148/68  Pulse: 80   78  Temp: 98.6 F (37 C)   98.6 F (37 C)  TempSrc: Oral   Oral  Resp: 19 22  18   Height:      Weight:      SpO2: 98% 97% 96% 98%     Intake/Output Summary (Last 24 hours) at 04/01/14 1901 Last data filed at 04/01/14 1800  Gross per 24 hour  Intake   1500 ml  Output   1525 ml  Net    -25 ml    General: Elderly female lying in bed in no acute distress HEENT: No pallor, no icterus, moist oral mucosa, no cervical lymphadenopathy, neck Chest: Clear to auscultation bilaterally, no added sounds CVS: Normal S1 and S2, no murmurs rub or gallop GI: Distended, bowel sounds present, surgical site intact, mild tenderness to pressure musculoskeletal: Warm, no edema, swelling with  tightness over left calf CNS: Alert and oriented  Labs on Admission:  CBC:    Component Value Date/Time   WBC 8.1 04/01/2014 1350   HGB 9.9* 04/01/2014 1350   HCT 30.9* 04/01/2014 1350   PLT 293 04/01/2014 1350   MCV 92.2 04/01/2014 1350   NEUTROABS 4.4 03/20/2014 1225   LYMPHSABS 2.0 03/20/2014 1225   MONOABS 0.4 03/20/2014 1225   EOSABS 0.1 03/20/2014 1225   BASOSABS 0.0 03/20/2014 9390    Basic Metabolic Panel:    Component Value Date/Time   NA 144 04/01/2014 1350   K 5.1 04/01/2014 1350   CL 112 04/01/2014 1350   CO2 24 04/01/2014 1350   BUN 13 04/01/2014 1350   CREATININE 0.85 04/01/2014 1350   GLUCOSE 117* 04/01/2014 1350   CALCIUM 9.3 04/01/2014 1350    Radiological Exams on Admission: Dg Chest 2 View  04/01/2014   CLINICAL DATA:  Shortness of Breath today.  EXAM: CHEST  2 VIEW  COMPARISON:  09/14/2007  FINDINGS: Prior CABG. There are low lung volumes. Bibasilar airspace opacities  are noted which likely reflect atelectasis although early infiltrate/ pneumonia cannot be excluded in the right lung base. No effusions. No acute bony abnormality.  IMPRESSION: Low lung volumes with bibasilar atelectasis or infiltrates.   Electronically Signed   By: Rolm Baptise M.D.   On: 04/01/2014 11:04   Dg Abd 2 Views  04/01/2014   CLINICAL DATA:  Abdominal distension, abdominal pain  EXAM: ABDOMEN - 2 VIEW  COMPARISON:  CT scan 12/11/2013  FINDINGS: There are distended small bowel loops with air-fluid levels in right abdomen highly suspicious for small bowel obstruction or significant ileus. No free abdominal air. Mild degenerative changes lumbar spine. Mild lumbar levoscoliosis. Midline skin staples are noted lower abdominal wall.  IMPRESSION: Distended small bowel loops with air-fluid levels in right abdomen highly suspicious for small bowel obstruction or significant ileus. No free abdominal air.   Electronically Signed   By: Lahoma Crocker M.D.   On: 04/01/2014 10:58     Assessment/Plan  Principle problem Shortness of breath -I think this is more likely due to abdominal distention with concern for ileus and some underlying lung atelectasis. I think acute is clinically less likely. CT angiogram of the chest has been ordered by primary and will follow.  If negative would need to focus on her abdominal distention and possible small bowel obstruction. -Agree with minimizing narcotics. Continue bedside incentive spirometry. Monitor with serial abdominal exam. -Check Doppler of left lower extremity.   Active Problems:  Early small bowel obstruction/ ileus Diet reduced to clears. Narcotics minimized. Serial abd exam. CCS following   complex pelvic mass s/p total abdominal hysterectomy and bilateral salpingo-oophorectomy Management per primary team.  We will follow-up with CT angiogram of the chest and Doppler lower extremity results. If negative will sign off. Thank you for the interesting consult.   Time Spent on consult: 45 minutes  DHUNGEL, NISHANT 04/01/2014, 7:01 PM

## 2014-04-01 NOTE — Progress Notes (Signed)
6 Days Post-Op Procedure(s) (LRB): EXPLORATORY LAPAROTOMY (N/A) TOTAL HYSTERECTOMY ABDOMINAL (N/A) BILATERL SALPINGO OOPHORECTOMY (Bilateral) OMENTECTOMY LYSIS OF ADHESION HERNIA REPAIR VENTRAL ADULT  Subjective: Patient reports shortness of breath.  She reports improvement in SOB when laying more supine.  She feels that her abdominal distention is improving but contributing to her SOB.  Belching intermittently and unsure if she has passed flatus.  Having bowel movements.  Adequate pain relief with PCA use.  Ambulating without difficulty.  Denies chest pain, cough, nausea, emesis.  Agreeable with trying a soft diet as tolerated.  No concerns voiced.    Objective: Vital signs in last 24 hours: Temp:  [98.2 F (36.8 C)-98.6 F (37 C)] 98.6 F (37 C) (03/21 0558) Pulse Rate:  [72-94] 80 (03/21 0558) Resp:  [14-25] 22 (03/21 0800) BP: (128-150)/(68-90) 150/78 mmHg (03/21 0558) SpO2:  [95 %-100 %] 97 % (03/21 0800) Weight:  [208 lb 1.6 oz (94.394 kg)] 208 lb 1.6 oz (94.394 kg) (03/21 0500) Last BM Date: 03/31/14  Intake/Output from previous day: 03/20 0701 - 03/21 0700 In: 3062.5 [I.V.:3062.5] Out: 1400 [Urine:1400]  Physical Examination: General: alert, cooperative and no distress Resp: diminished in the bases, no crackles or rales noted Cardio: systolic murmur noted, regular rate and rhythm noted GI: incision: dressing intact, midline incision with staples without drainage and abdomen distended- more in the upper abdomen, lower abdomen soft, active bowel sounds, tympanic on percussion Extremities: mild edema noted bilaterally, mildly pitting-less than 1+  Assessment: 69 y.o. 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  Heme: Last CBC on 03/31/14.  Stable post-operatively.  CV: BP and HR stable post-operatively.   Continue to monitor with ordered vital signs.  Amlodipine/Valsartan ordered.  GI:  Tolerating po: Yes, clear liquids.     GU:  Adequate output reported.    FEN: Stable post-operatively.  Prophylaxis: pharmacologic prophylaxis (with any of the following: enoxaparin (Lovenox) 40mg  SQ 2 hours prior to surgery then every day) and intermittent pneumatic compression boots.  Plan: Chest 2 view to evaluate SOB Resume HCTZ Diet to soft as tolerated Ensure between meals if tolerated Discontinue PCA Saline lock IV Tylenol and Ibuprofen scheduled.  Tramadol PRN.  Limit narcotic use. KUB in abdominal symptoms worsen Encourage ambulation, IS use, deep breathing, and coughing Continue post-operative plan of care per Dr. Delsa Sale and Dr. Excell Seltzer   LOS: 6 days    CROSS, MELISSA DEAL 04/01/2014, 9:07 AM

## 2014-04-02 ENCOUNTER — Inpatient Hospital Stay (HOSPITAL_COMMUNITY): Payer: Medicare Other

## 2014-04-02 ENCOUNTER — Encounter (HOSPITAL_COMMUNITY): Payer: Self-pay | Admitting: Radiology

## 2014-04-02 DIAGNOSIS — M7989 Other specified soft tissue disorders: Secondary | ICD-10-CM

## 2014-04-02 LAB — BASIC METABOLIC PANEL
ANION GAP: 8 (ref 5–15)
BUN: 14 mg/dL (ref 6–23)
CHLORIDE: 107 mmol/L (ref 96–112)
CO2: 23 mmol/L (ref 19–32)
CREATININE: 0.81 mg/dL (ref 0.50–1.10)
Calcium: 8.6 mg/dL (ref 8.4–10.5)
GFR calc Af Amer: 85 mL/min — ABNORMAL LOW (ref >90)
GFR calc non Af Amer: 73 mL/min — ABNORMAL LOW (ref >90)
Glucose, Bld: 76 mg/dL (ref 70–99)
POTASSIUM: 3.9 mmol/L (ref 3.5–5.1)
Sodium: 138 mmol/L (ref 135–145)

## 2014-04-02 LAB — CBC
HEMATOCRIT: 30.4 % — AB (ref 36.0–46.0)
Hemoglobin: 9.9 g/dL — ABNORMAL LOW (ref 12.0–15.0)
MCH: 29.4 pg (ref 26.0–34.0)
MCHC: 32.6 g/dL (ref 30.0–36.0)
MCV: 90.2 fL (ref 78.0–100.0)
PLATELETS: 276 10*3/uL (ref 150–400)
RBC: 3.37 MIL/uL — ABNORMAL LOW (ref 3.87–5.11)
RDW: 13.6 % (ref 11.5–15.5)
WBC: 6.9 10*3/uL (ref 4.0–10.5)

## 2014-04-02 LAB — GLUCOSE, CAPILLARY
Glucose-Capillary: 90 mg/dL (ref 70–99)
Glucose-Capillary: 115 mg/dL — ABNORMAL HIGH (ref 70–99)

## 2014-04-02 LAB — PHOSPHORUS: Phosphorus: 3.2 mg/dL (ref 2.3–4.6)

## 2014-04-02 LAB — MAGNESIUM: Magnesium: 1.8 mg/dL (ref 1.5–2.5)

## 2014-04-02 MED ORDER — IBUPROFEN 200 MG PO TABS: 600.0000 mg | ORAL_TABLET | Freq: Four times a day (QID) | ORAL | Status: DC | PRN

## 2014-04-02 MED ORDER — INSULIN ASPART 100 UNIT/ML ~~LOC~~ SOLN
0.0000 [IU] | Freq: Four times a day (QID) | SUBCUTANEOUS | Status: DC
  Administered 2014-04-03 – 2014-04-04 (×2): 1 [IU] via SUBCUTANEOUS

## 2014-04-02 MED ORDER — SODIUM CHLORIDE 0.9 % IJ SOLN
10.0000 mL | INTRAMUSCULAR | Status: DC | PRN
  Administered 2014-04-03 – 2014-04-04 (×3): 10 mL
  Filled 2014-04-02 (×3): qty 40

## 2014-04-02 MED ORDER — IOHEXOL 350 MG/ML SOLN
100.0000 mL | Freq: Once | INTRAVENOUS | Status: AC | PRN
  Administered 2014-04-02: 100 mL via INTRAVENOUS

## 2014-04-02 MED ORDER — TRACE MINERALS CR-CU-F-FE-I-MN-MO-SE-ZN IV SOLN
INTRAVENOUS | Status: AC
  Administered 2014-04-02: 17:00:00 via INTRAVENOUS
  Filled 2014-04-02: qty 960

## 2014-04-02 MED ORDER — FAT EMULSION 20 % IV EMUL
240.0000 mL | INTRAVENOUS | Status: AC
  Administered 2014-04-02: 240 mL via INTRAVENOUS
  Filled 2014-04-02: qty 250

## 2014-04-02 MED ORDER — ENOXAPARIN (LOVENOX) PATIENT EDUCATION KIT
PACK | Freq: Once | Status: AC
  Administered 2014-04-02: 17:00:00
  Filled 2014-04-02: qty 1

## 2014-04-02 MED ORDER — ACETAMINOPHEN 500 MG PO TABS: 1000.0000 mg | ORAL_TABLET | Freq: Four times a day (QID) | ORAL | Status: DC | PRN

## 2014-04-02 MED ORDER — MAGNESIUM SULFATE 2 GM/50ML IV SOLN
2.0000 g | Freq: Once | INTRAVENOUS | Status: AC
  Administered 2014-04-02: 2 g via INTRAVENOUS
  Filled 2014-04-02: qty 50

## 2014-04-02 MED ORDER — KCL IN DEXTROSE-NACL 20-5-0.45 MEQ/L-%-% IV SOLN
INTRAVENOUS | Status: AC
  Administered 2014-04-02 (×2): via INTRAVENOUS
  Administered 2014-04-03: 125 mL/h via INTRAVENOUS
  Administered 2014-04-03: 12:00:00 via INTRAVENOUS
  Filled 2014-04-02 (×4): qty 1000

## 2014-04-02 NOTE — Progress Notes (Signed)
Patient ID: [name], female   DOB: 1945-11-18, 69 y.o.   MRN: [id number] 7 Days Post-Op  Subjective: Feels better today.  Has had flatus and a small BM. Pain is improving, no nausea or vomiting on CL diet  Objective: Vital signs in last 24 hours: Temp:  [97.3 F (36.3 C)-98.6 F (37 C)] 98.1 F (36.7 C) (03/22 0545) Pulse Rate:  [76-85] 76 (03/22 0545) Resp:  [18] 18 (03/22 0545) BP: (127-154)/(68-75) 132/70 mmHg (03/22 0545) SpO2:  [96 %-100 %] 100 % (03/22 0545) Weight:  [94.7 kg (208 lb 12.4 oz)] 94.7 kg (208 lb 12.4 oz) (03/22 0545) Last BM Date: 04/01/14  Intake/Output from previous day: 03/21 0701 - 03/22 0700 In: -  Out: 1325 [Urine:1325] Intake/Output this shift:    General appearance: alert, cooperative and no distress GI: Still somewhat distended and tympanitic in upper abdomen but clearly less so than yesterday.  Non tender Incision/Wound: Clean and dry  Lab Results:   Recent Labs  04/01/14 1350 04/02/14 0858  WBC 8.1 6.9  HGB 9.9* 9.9*  HCT 30.9* 30.4*  PLT 293 276   BMET  Recent Labs  04/01/14 1350 04/02/14 0858  NA 144 138  K 5.1 3.9  CL 112 107  CO2 24 23  GLUCOSE 117* 76  BUN 13 14  CREATININE 0.85 0.81  CALCIUM 9.3 8.6     Studies/Results: Dg Chest 2 View  04/01/2014   CLINICAL DATA:  Shortness of Breath today.  EXAM: CHEST  2 VIEW  COMPARISON:  09/14/2007  FINDINGS: Prior CABG. There are low lung volumes. Bibasilar airspace opacities are noted which likely reflect atelectasis although early infiltrate/ pneumonia cannot be excluded in the right lung base. No effusions. No acute bony abnormality.  IMPRESSION: Low lung volumes with bibasilar atelectasis or infiltrates.   Electronically Signed   By: Rolm Baptise M.D.   On: 04/01/2014 11:04   Dg Abd 2 Views  04/01/2014   CLINICAL DATA:  Abdominal distension, abdominal pain  EXAM: ABDOMEN - 2 VIEW  COMPARISON:  CT scan 12/11/2013  FINDINGS: There are distended small bowel loops with  air-fluid levels in right abdomen highly suspicious for small bowel obstruction or significant ileus. No free abdominal air. Mild degenerative changes lumbar spine. Mild lumbar levoscoliosis. Midline skin staples are noted lower abdominal wall.  IMPRESSION: Distended small bowel loops with air-fluid levels in right abdomen highly suspicious for small bowel obstruction or significant ileus. No free abdominal air.   Electronically Signed   By: Lahoma Crocker M.D.   On: 04/01/2014 10:58    Anti-infectives: Anti-infectives    Start     Dose/Rate Route Frequency Ordered Stop   03/26/14 0943  ANCEF 1 gram in 0.9% normal saline 500 mL  Status:  Discontinued       As needed 03/26/14 0944 03/26/14 1119   03/26/14 0942  ANCEF 1 gram in 0.9% normal saline 500 mL  Status:  Discontinued       As needed 03/26/14 7510 03/26/14 1119   03/26/14 0539  ceFAZolin (ANCEF) IVPB 2 g/50 mL premix     2 g 100 mL/hr over 30 Minutes Intravenous On call to O.R. 03/26/14 0539 03/26/14 0736      Assessment/Plan: s/p Procedure(s): EXPLORATORY LAPAROTOMY TOTAL HYSTERECTOMY ABDOMINAL BILATERL SALPINGO OOPHORECTOMY OMENTECTOMY LYSIS OF ADHESION HERNIA REPAIR VENTRAL ADULT Ileus vs partial SBO, seems improved today Pt going for CT chest due to SOB yesterday which actually is improved.  Will add CT abdomen/pelvis  non contrast while she is there to look at GI tract    LOS: 7 days    Jaymian Bogart T 04/02/2014

## 2014-04-02 NOTE — Progress Notes (Signed)
Peripherally Inserted Central Catheter/Midline Placement  The IV Nurse has discussed with the patient and/or persons authorized to consent for the patient, the purpose of this procedure and the potential benefits and risks involved with this procedure.  The benefits include less needle sticks, lab draws from the catheter and patient may be discharged home with the catheter.  Risks include, but not limited to, infection, bleeding, blood clot (thrombus formation), and puncture of an artery; nerve damage and irregular heat beat.  Alternatives to this procedure were also discussed.  PICC/Midline Placement Documentation        [name], [name] 04/02/2014, 4:42 PM

## 2014-04-02 NOTE — Progress Notes (Signed)
7 Days Post-Op Procedure(s) (LRB): EXPLORATORY LAPAROTOMY (N/A) TOTAL HYSTERECTOMY ABDOMINAL (N/A) BILATERL SALPINGO OOPHORECTOMY (Bilateral) OMENTECTOMY LYSIS OF ADHESION HERNIA REPAIR VENTRAL ADULT  Subjective: Patient reports mild improvement in shortness of breath.  Reporting an intermittent dry cough.  Using IS.  She feels that her abdominal distention is improving.  Passing flatus and having watery stools.  Adequate pain relief when taking two tramadol tablets.  Tolerating some clear liquids including ginger ale.  Ambulating without difficulty.  Denies chest pain, nausea, emesis.  No concerns voiced.    Objective: Vital signs in last 24 hours: Temp:  [97.3 F (36.3 C)-98.1 F (36.7 C)] 98.1 F (36.7 C) (03/22 0545) Pulse Rate:  [76-85] 76 (03/22 0545) Resp:  [18] 18 (03/22 0545) BP: (127-154)/(70-75) 132/70 mmHg (03/22 0545) SpO2:  [99 %-100 %] 100 % (03/22 0545) Weight:  [208 lb 12.4 oz (94.7 kg)] 208 lb 12.4 oz (94.7 kg) (03/22 0545) Last BM Date: 04/01/14  Intake/Output from previous day: 03/21 0701 - 03/22 0700 In: -  Out: 1325 [Urine:1325]   Physical Examination: General: alert, cooperative and no distress Resp: diminished in the bases, no crackles or rales noted Cardio: systolic murmur noted, regular rate and rhythm noted GI: incision: dressing removed, midline incision with staples with minimal amount of serous drainage and abdomen distended- more in the upper abdomen but improved from 03/21 assessment, lower abdomen soft, active bowel sounds, tympanic on percussion in upper abd Extremities: mild edema noted bilaterally, mildly pitting-less than 1+, left calf mildly larger than the right, negative homan's sign  WBC/Hgb/Hct/Plts:  6.9/9.9/30.4/276 (03/22 0858). BUN/Cr/glu/ALT/AST/amyl/lip:  14/0.81/--/--/--/--/-- (03/22 5597)  CT Angiogram: IMPRESSION: No pulmonary emboli. Bilateral lower lobe atelectasis, right more than left.  CT AP: IMPRESSION: Evidence of  a mid early/partial small bowel obstructive process with point of obstruction likely the surgical site over a small bowel loop in the anterior right mid abdomen. Small bowel measures up to 5 cm in diameter. Minimal free perihepatic fluid and free fluid in the pelvis. No free peritoneal air.  Small upper abdominal midline ventral hernia unchanged in size but now containing moderate stranding of the herniated fat as cannot exclude omental infarction. Air and fluid are present over 2 more inferiorly seen supraumbilical ventral hernias without definite connection to the peritoneal cavity as patient the abundant on hernia repair. These air/fluid collections may be infected versus noninfected postoperative collections.  Minimal gallbladder dilatation of mild dilatation of the common bowel duct which measures 1 cm. No definite ductal stones. Consider right upper quadrant ultrasound if indicated for further evaluation. If  Two left renal cortical hypodensities unchanged likely slightly hyperdense cyst. Recommend a followup CT with pre and post-contrast images on elective basis.  Assessment: 69 y.o. s/p Procedure(s): EXPLORATORY LAPAROTOMY TOTAL HYSTERECTOMY ABDOMINAL BILATERL SALPINGO OOPHORECTOMY OMENTECTOMY LYSIS OF ADHESION HERNIA REPAIR VENTRAL ADULT: stable Pain:  Pain is well-controlled on PRN medications.  Heme: Last CBC on 04/02/14.  Stable post-operatively.  CV: BP and HR stable post-operatively.  Continue to monitor with ordered vital signs.  Amlodipine/Valsartan ordered.  Resp: CT angio negative for PE.   GI:  Tolerating po: Yes, clear liquids.     GU:  Adequate output reported.    FEN: Stable post-operatively.  Prophylaxis: pharmacologic prophylaxis (with any of the following: enoxaparin (Lovenox) 72m SQ 2 hours prior to surgery then every day) and intermittent pneumatic compression boots.  Plan: Appreciate Hospitalist Consult NPO with sips of clears Initiate  TPN per pharmacy protocol Lovenox teaching kit for discharge Encourage  ambulation, IS use, deep breathing, and coughing Continue post-operative plan of care per Dr. Delsa Sale and Dr. Excell Seltzer   LOS: 7 days    CROSS, MELISSA DEAL 04/02/2014, 2:04 PM

## 2014-04-02 NOTE — Progress Notes (Signed)
IV RN inserted  22 gauge iv catheter,unble to insert larger gauge due to difficult stick. DR. Delsa Sale paged to inquire about PICC placement.- awaiting call back.

## 2014-04-02 NOTE — Progress Notes (Signed)
*  Preliminary Results* Left lower extremity venous duplex completed. Left lower extremity is negative for deep vein thrombosis. There is no evidence of left Baker's cyst.  04/02/2014 1:05 PM  Maudry Mayhew, RVT, RDCS, RDMS

## 2014-04-02 NOTE — Progress Notes (Signed)
Patient returned from CT- unable to do CT because unable to pass contrast through 20 gauge IV catheter that is in- per [facility] team notified for evaluation for new IV site.

## 2014-04-02 NOTE — Progress Notes (Addendum)
PARENTERAL NUTRITION CONSULT NOTE - INITIAL  Pharmacy Consult for TPN Indication: prolonged ileus  No Known Allergies  Patient Measurements: Height: 5\' 9"  (175.3 cm) Weight: 208 lb 12.4 oz (94.7 kg) IBW/kg (Calculated) : 66.2 Adjusted Body Weight: 73 kg BMI: 31  Vital Signs: Temp: 97.6 F (36.4 C) (03/22 1400) Temp Source: Oral (03/22 0545) BP: 133/72 mmHg (03/22 1400) Pulse Rate: 79 (03/22 1400) Intake/Output from previous day: 03/21 0701 - 03/22 0700 In: -  Out: 1325 [Urine:1325] Intake/Output from this shift: Total I/O In: 860 [P.O.:360; I.V.:500] Out: 1400 [Urine:1400]  Labs:  Recent Labs  03/31/14 0624 04/01/14 1350 04/02/14 0858  WBC 5.1 8.1 6.9  HGB 10.0* 9.9* 9.9*  HCT 31.6* 30.9* 30.4*  PLT 265 293 276     Recent Labs  03/31/14 0624 04/01/14 1350 04/02/14 0858  NA 143 144 138  K 5.1 5.1 3.9  CL 106 112 107  CO2 29 24 23   GLUCOSE 135* 117* 76  BUN 15 13 14   CREATININE 0.81 0.85 0.81  CALCIUM 8.8 9.3 8.6   Estimated Creatinine Clearance: 81.4 mL/min (by C-G formula based on Cr of 0.81).   No results for input(s): GLUCAP in the last 72 hours.  Medical History: Past Medical History  Diagnosis Date  . HTN (hypertension)   . Hyperlipidemia   . CAD (coronary artery disease)     2009 RCA occluded proximally, LAD occluded proximally, left main 90% stenosis, LIMA to the LAD widely patent, SVG to OM and diagonal patent, SVG RCA patent. This stenosis after the anastomosis of 80% in the native vessel. This was treated with PCI.  Marland Kitchen Collagen vascular disease     Medications:  See med rec   Insulin Requirements in the past 24 hours:  none  Current Nutrition: clear liquid diet  IVF: D5 1/2NS with 20 KCL at 125 mlhr  Central access: pending  PICC placement 04/02/14 (confirmed with IV team) TPN start date: 04/02/14  ASSESSMENT                                                                                                          Patient is a  69 y.o F with hx of aortobifemoral bypass in 2003, bilateral ovarian masses and now with an incisional hernia-- s/p exp laparotomy, hernia repair, lysis of adhesion, total hysterectomy, and bilateral oophorectomy on 3/15.  Patient is refusing NG tube placement. CT abdomen on 3/22 showed "a mid early/partial small bowel obstructive process with point of obstruction likely the surgical site over a small bowel loop in the anterior right mid abdomen." To start TPN for post-op ileus.  HPI: hernia, aortobifemoral bypass in 2003, depression, GERD, HTN, HLP, lysis of adhesions and small bowel stricturoplasty  Significant events:  - Surgery on 3/15 - CT abd on 3/22 with early small bowel obstruction  Today:   Glucose (goal cbgs <150): 76-117  Electrolytes: K 3.9, Na 138, Ca 9.4 on 3/09, phos 3.2 and mag 1.8  Renal: scr 0.81  LFTs: LFTs on 3/19 wnl  TGs: pending  Prealbumin:  pending  NUTRITIONAL GOALS                                                                                             RD recs: pending   Estimated protein goal: 95-117 gm/day Estimated Kcal/day goal: 1970 kcal/day Will initiate TPN today based on estimated goals above and will adjust on 3/23 based on RD recommendations.  Clinimix E 5/15 at a goal rate of 90 ml/hr + 20% fat emulsion at 10 ml/hr to provide: 108 g/day protein, 2013 Kcal/day.  PLAN                                                                                                                         1) CMET, Mag, Phos, Triglyceride, and Prealbumin on 3/23 2) At 1800 today:  Start Clinimix E 5/15 at 40 ml/hr.  20% fat emulsion at 10 ml/hr.  Plan to advance as tolerated to the goal rate.  TPN to contain standard multivitamins and trace elements.  Reduce IVF to 85 ml/hr.  Sensitive SSI q6h and cbgs q6h  Magnesium 2gm IV x1 today  TPN lab panels on Mondays & Thursdays.  F/u daily.  Lovie Agresta P 04/02/2014,2:18 PM

## 2014-04-02 NOTE — Progress Notes (Signed)
New orders received for IVF and PICC placement from L Jackson-Moore,MD.

## 2014-04-02 NOTE — Clinical Documentation Improvement (Signed)
  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 Sections from the nonpolypoid endometrium demonstrate predominantly atrophic endometrium with cystic change associated with multiple foci of noninvasive malignant clear cell epithelium with cytomorphologic features identical to the adenocarcinoma involving the polyp; consistent with clear cell endometrial intra-epithelial neoplasia.  Please document in the progress notes and discharge summary if you agree with the findings of the pathology report.  Thank You, Erling Conte ,RN Clinical Documentation Specialist:  [phone] [facility] [employer] [facility]

## 2014-04-03 LAB — COMPREHENSIVE METABOLIC PANEL
ALT: 16 U/L (ref 0–35)
ANION GAP: 7 (ref 5–15)
AST: 21 U/L (ref 0–37)
Albumin: 2.8 g/dL — ABNORMAL LOW (ref 3.5–5.2)
Alkaline Phosphatase: 46 U/L (ref 39–117)
BUN: 10 mg/dL (ref 6–23)
CALCIUM: 8.4 mg/dL (ref 8.4–10.5)
CO2: 27 mmol/L (ref 19–32)
Chloride: 104 mmol/L (ref 96–112)
Creatinine, Ser: 0.67 mg/dL (ref 0.50–1.10)
GFR, EST NON AFRICAN AMERICAN: 88 mL/min — AB (ref >90)
GLUCOSE: 133 mg/dL — AB (ref 70–99)
Potassium: 3.9 mmol/L (ref 3.5–5.1)
Sodium: 138 mmol/L (ref 135–145)
Total Bilirubin: 0.4 mg/dL (ref 0.3–1.2)
Total Protein: 5.5 g/dL — ABNORMAL LOW (ref 6.0–8.3)

## 2014-04-03 LAB — PHOSPHORUS: PHOSPHORUS: 2.7 mg/dL (ref 2.3–4.6)

## 2014-04-03 LAB — DIFFERENTIAL
Basophils Absolute: 0 10*3/uL (ref 0.0–0.1)
Basophils Relative: 0 % (ref 0–1)
Eosinophils Absolute: 0.1 10*3/uL (ref 0.0–0.7)
Eosinophils Relative: 1 % (ref 0–5)
LYMPHS ABS: 2.1 10*3/uL (ref 0.7–4.0)
LYMPHS PCT: 27 % (ref 12–46)
Monocytes Absolute: 1.1 10*3/uL — ABNORMAL HIGH (ref 0.1–1.0)
Monocytes Relative: 15 % — ABNORMAL HIGH (ref 3–12)
NEUTROS PCT: 57 % (ref 43–77)
Neutro Abs: 4.4 10*3/uL (ref 1.7–7.7)

## 2014-04-03 LAB — CBC
HEMATOCRIT: 28.9 % — AB (ref 36.0–46.0)
Hemoglobin: 9.6 g/dL — ABNORMAL LOW (ref 12.0–15.0)
MCH: 29.5 pg (ref 26.0–34.0)
MCHC: 33.2 g/dL (ref 30.0–36.0)
MCV: 88.9 fL (ref 78.0–100.0)
Platelets: 271 10*3/uL (ref 150–400)
RBC: 3.25 MIL/uL — AB (ref 3.87–5.11)
RDW: 13.5 % (ref 11.5–15.5)
WBC: 7.7 10*3/uL (ref 4.0–10.5)

## 2014-04-03 LAB — GLUCOSE, CAPILLARY
GLUCOSE-CAPILLARY: 103 mg/dL — AB (ref 70–99)
GLUCOSE-CAPILLARY: 116 mg/dL — AB (ref 70–99)
GLUCOSE-CAPILLARY: 120 mg/dL — AB (ref 70–99)
GLUCOSE-CAPILLARY: 130 mg/dL — AB (ref 70–99)

## 2014-04-03 LAB — TRIGLYCERIDES: TRIGLYCERIDES: 191 mg/dL — AB (ref <150)

## 2014-04-03 LAB — MAGNESIUM: Magnesium: 2 mg/dL (ref 1.5–2.5)

## 2014-04-03 MED ORDER — KCL IN DEXTROSE-NACL 20-5-0.45 MEQ/L-%-% IV SOLN
INTRAVENOUS | Status: DC
  Administered 2014-04-04: 45 mL/h via INTRAVENOUS
  Filled 2014-04-03: qty 1000

## 2014-04-03 MED ORDER — FAT EMULSION 20 % IV EMUL
240.0000 mL | INTRAVENOUS | Status: AC
  Administered 2014-04-03: 240 mL via INTRAVENOUS
  Filled 2014-04-03: qty 250

## 2014-04-03 MED ORDER — TRACE MINERALS CR-CU-F-FE-I-MN-MO-SE-ZN IV SOLN
INTRAVENOUS | Status: DC
  Filled 2014-04-03: qty 1680

## 2014-04-03 MED ORDER — FAT EMULSION 20 % IV EMUL
240.0000 mL | INTRAVENOUS | Status: DC
  Filled 2014-04-03: qty 250

## 2014-04-03 MED ORDER — FAT EMULSION 20 % IV EMUL: 240.0000 mL | INTRAVENOUS | Status: DC

## 2014-04-03 MED ORDER — TRACE MINERALS CR-CU-F-FE-I-MN-MO-SE-ZN IV SOLN: INTRAVENOUS | Status: DC

## 2014-04-03 MED ORDER — TRACE MINERALS CR-CU-F-FE-I-MN-MO-SE-ZN IV SOLN
INTRAVENOUS | Status: AC
  Administered 2014-04-03: 18:00:00 via INTRAVENOUS
  Filled 2014-04-03: qty 1680

## 2014-04-03 NOTE — Progress Notes (Signed)
8 Days Post-Op Procedure(s) (LRB): EXPLORATORY LAPAROTOMY (N/A) TOTAL HYSTERECTOMY ABDOMINAL (N/A) BILATERL SALPINGO OOPHORECTOMY (Bilateral) OMENTECTOMY LYSIS OF ADHESION HERNIA REPAIR VENTRAL ADULT  Subjective: Patient reports improvement in distension. She is routinely passing flatus. She is hungry. She denies nausea. Her last emesis was "four to five days ago". She has twinges of pain around her upper abdomen. She does not have diarrhea.    Objective: Vital signs in last 24 hours: Temp:  [97.7 F (36.5 C)-97.8 F (36.6 C)] 97.8 F (36.6 C) (03/23 0539) Pulse Rate:  [71-73] 71 (03/23 0539) Resp:  [16] 16 (03/23 0539) BP: (107-130)/(58-71) 110/60 mmHg (03/23 1059) SpO2:  [100 %] 100 % (03/23 1100) Weight:  [198 lb 3.2 oz (89.903 kg)] 198 lb 3.2 oz (89.903 kg) (03/23 0543) Last BM Date: 04/01/14  Intake/Output from previous day: 03/22 0701 - 03/23 0700 In: 2656.7 [P.O.:120; I.V.:2500; TPN:36.7] Out: 6500 [Urine:6500]   Physical Examination: General: alert, cooperative and no distress Resp: diminished in the bases, no crackles or rales noted Cardio: systolic murmur noted, regular rate and rhythm noted GI: incision: dressing removed, midline incision with staples with no drainage or erythema. Mild distension. + bowel sounds   Extremities: mild edema noted bilaterally, mildly pitting-less than 1+, left calf mildly larger than the right, negative homan's sign  WBC/Hgb/Hct/Plts:  6.9/9.9/30.4/276 (03/22 0858). BUN/Cr/glu/ALT/AST/amyl/lip:  14/0.81/--/--/--/--/-- (03/22 0858) CBC    Component Value Date/Time   WBC 7.7 04/03/2014 0501   RBC 3.25* 04/03/2014 0501   HGB 9.6* 04/03/2014 0501   HCT 28.9* 04/03/2014 0501   PLT 271 04/03/2014 0501   MCV 88.9 04/03/2014 0501   MCH 29.5 04/03/2014 0501   MCHC 33.2 04/03/2014 0501   RDW 13.5 04/03/2014 0501   LYMPHSABS 2.1 04/03/2014 0501   MONOABS 1.1* 04/03/2014 0501   EOSABS 0.1 04/03/2014 0501   BASOSABS 0.0 04/03/2014 0501     CMP     Component Value Date/Time   NA 138 04/03/2014 0501   K 3.9 04/03/2014 0501   CL 104 04/03/2014 0501   CO2 27 04/03/2014 0501   GLUCOSE 133* 04/03/2014 0501   BUN 10 04/03/2014 0501   CREATININE 0.67 04/03/2014 0501   CALCIUM 8.4 04/03/2014 0501   PROT 5.5* 04/03/2014 0501   ALBUMIN 2.8* 04/03/2014 0501   AST 21 04/03/2014 0501   ALT 16 04/03/2014 0501   ALKPHOS 46 04/03/2014 0501   BILITOT 0.4 04/03/2014 0501   GFRNONAA 88* 04/03/2014 0501   GFRAA >90 04/03/2014 0501      CT Angiogram: IMPRESSION: No pulmonary emboli. Bilateral lower lobe atelectasis, right more than left.  CT AP: IMPRESSION: Evidence of a mid early/partial small bowel obstructive process with point of obstruction likely the surgical site over a small bowel loop in the anterior right mid abdomen. Small bowel measures up to 5 cm in diameter. Minimal free perihepatic fluid and free fluid in the pelvis. No free peritoneal air.  Small upper abdominal midline ventral hernia unchanged in size but now containing moderate stranding of the herniated fat as cannot exclude omental infarction. Air and fluid are present over 2 more inferiorly seen supraumbilical ventral hernias without definite connection to the peritoneal cavity as patient the abundant on hernia repair. These air/fluid collections may be infected versus noninfected postoperative collections.  Minimal gallbladder dilatation of mild dilatation of the common bowel duct which measures 1 cm. No definite ductal stones. Consider right upper quadrant ultrasound if indicated for further evaluation. If  Two left renal cortical hypodensities unchanged  likely slightly hyperdense cyst. Recommend a followup CT with pre and post-contrast images on elective basis.  Assessment: 69 y.o. s/p Procedure(s): EXPLORATORY LAPAROTOMY TOTAL HYSTERECTOMY ABDOMINAL BILATERL SALPINGO OOPHORECTOMY OMENTECTOMY LYSIS OF ADHESION HERNIA REPAIR VENTRAL  ADULT: stable Pain:  Pain is well-controlled on PRN medications.  Heme: Last CBC on 04/03/14.  Stable post-operatively.  CV: BP and HR stable post-operatively.  Continue to monitor with ordered vital signs.  Amlodipine/Valsartan ordered.  Resp: CT angio negative for PE.   GI:  Tolerating po: Yes, clear liquids.  Improving GI function. Will advance diet tomorrow.   GU:  Adequate output reported.    FEN: Stable post-operatively.  Prophylaxis: pharmacologic prophylaxis (with any of the following: enoxaparin (Lovenox) 77m SQ 2 hours prior to surgery then every day) and intermittent pneumatic compression boots.  Plan:  Continue clears.  Continue TPN per pharmacy protocol Lovenox teaching kit for discharge Encourage ambulation, IS use, deep breathing, and coughing Moving towards anticipated discharge to home in 24-48 hours.   LOS: 8 days    RDonaciano Eva3/23/2016, 2:55 PM

## 2014-04-03 NOTE — Progress Notes (Signed)
PARENTERAL NUTRITION CONSULT NOTE - Follow up  Pharmacy Consult for TPN Indication: prolonged ileus  No Known Allergies  Patient Measurements: Height: 5\' 9"  (175.3 cm) Weight: 198 lb 3.2 oz (89.903 kg) IBW/kg (Calculated) : 66.2 Adjusted Body Weight: 73 kg BMI: 31  Vital Signs: Temp: 97.8 F (36.6 C) (03/23 0539) Temp Source: Oral (03/23 0539) BP: 107/58 mmHg (03/23 0539) Pulse Rate: 71 (03/23 0539) Intake/Output from previous day: 03/22 0701 - 03/23 0700 In: 2656.7 [P.O.:120; I.V.:2500; TPN:36.7] Out: 6500 [Urine:6500] Intake/Output from this shift:    Labs:  Recent Labs  04/01/14 1350 04/02/14 0858 04/03/14 0501  WBC 8.1 6.9 7.7  HGB 9.9* 9.9* 9.6*  HCT 30.9* 30.4* 28.9*  PLT 293 276 271     Recent Labs  04/01/14 1350 04/02/14 0858 04/02/14 0900 04/03/14 0501  NA 144 138  --  138  K 5.1 3.9  --  3.9  CL 112 107  --  104  CO2 24 23  --  27  GLUCOSE 117* 76  --  133*  BUN 13 14  --  10  CREATININE 0.85 0.81  --  0.67  CALCIUM 9.3 8.6  --  8.4  MG  --   --  1.8 2.0  PHOS  --   --  3.2 2.7  PROT  --   --   --  5.5*  ALBUMIN  --   --   --  2.8*  AST  --   --   --  21  ALT  --   --   --  16  ALKPHOS  --   --   --  46  BILITOT  --   --   --  0.4   Estimated Creatinine Clearance: 80.4 mL/min (by C-G formula based on Cr of 0.67).    Recent Labs  04/02/14 1718 04/02/14 2328 04/03/14 0538  GLUCAP 90 115* 120*    Medical History: Past Medical History  Diagnosis Date  . HTN (hypertension)   . Hyperlipidemia   . CAD (coronary artery disease)     2009 RCA occluded proximally, LAD occluded proximally, left main 90% stenosis, LIMA to the LAD widely patent, SVG to OM and diagonal patent, SVG RCA patent. This stenosis after the anastomosis of 80% in the native vessel. This was treated with PCI.  Marland Kitchen Collagen vascular disease     Medications:  See med rec   Insulin Requirements in the past 24 hours:  none  Current Nutrition: clear liquid  diet  IVF: D5 1/2NS with 20 KCL at 125 mlhr  Central access: PICC placed 04/02/14 TPN start date: 04/02/14  ASSESSMENT                                                                                                          Patient is a 69 y.o F with hx of aortobifemoral bypass in 2003, bilateral ovarian masses and now with an incisional hernia-- s/p exp laparotomy, hernia repair, lysis of adhesion, total hysterectomy, and bilateral oophorectomy on 3/15.  Patient is refusing  NG tube placement. CT abdomen on 3/22 showed "a mid early/partial small bowel obstructive process with point of obstruction likely the surgical site over a small bowel loop in the anterior right mid abdomen." To start TPN for post-op ileus.  HPI: hernia, aortobifemoral bypass in 2003, depression, GERD, HTN, HLP, lysis of adhesions and small bowel stricturoplasty  Significant events:  - Surgery on 3/15 - CT abd on 3/22 with early small bowel obstruction  Today:   Glucose (goal cbgs <150): 115 - 133.   Electrolytes: wnl, CorrCa 9.4, Phos wnl, Mag improved after bolus yesterday.   Renal: SCr wnl  LFTs: LFTs on 3/19 wnl  TGs: 191  Prealbumin: in process  NUTRITIONAL GOALS                                                                                             RD recs: pending  Estimated protein goal: 95-117 g/day Estimated Kcal/day goal: 1970 kcal/day Will initiate TPN today based on estimated goals above and will adjust on 3/23 based on RD recommendations.  Clinimix E 5/15 at a goal rate of 90 ml/hr + 20% fat emulsion at 10 ml/hr to provide: 108 g/day protein, 2013 Kcal/day.  PLAN                                                                                                                         At 1800 today:  Increase Clinimix E 5/15 to 70 ml/hr.  Continue 20% fat emulsion at 10 ml/hr.  Plan to advance as tolerated to the goal rate.  TPN to contain standard multivitamins and trace  elements.  Reduce IVF by the same amount that TPN is providing. (Reduce from 133ml to 42ml/hr).  Cont sensitive SSI q6h and cbgs q6h.  TPN lab panels on Mondays & Thursdays.  F/u daily.  Romeo Rabon, PharmD, pager [phone]. 04/03/2014,8:19 AM.

## 2014-04-03 NOTE — Progress Notes (Signed)
Patient ID: [name], female   DOB: [birth year], 69 y.o.   MRN: [id number] 8 Days Post-Op  Subjective: Continues to feel better daily. Has had more flatus, no bowel movements. She is hungry. No nausea or vomiting. Pain is better.  Objective: Vital signs in last 24 hours: Temp:  [97.6 F (36.4 C)-97.8 F (36.6 C)] 97.8 F (36.6 C) (03/23 0539) Pulse Rate:  [71-79] 71 (03/23 0539) Resp:  [16-18] 16 (03/23 0539) BP: (107-133)/(58-72) 107/58 mmHg (03/23 0539) SpO2:  [100 %] 100 % (03/23 0539) Weight:  [89.903 kg (198 lb 3.2 oz)] 89.903 kg (198 lb 3.2 oz) (03/23 0543) Last BM Date: 04/01/14  Intake/Output from previous day: 03/22 0701 - 03/23 0700 In: 2656.7 [P.O.:120; I.V.:2500; TPN:36.7] Out: 6500 [Urine:6500] Intake/Output this shift:    General appearance: alert, cooperative and no distress GI: mild distention which continues to improve. Soft and nontender. Incision/Wound: no erythema or drainage  Lab Results:   Recent Labs  04/02/14 0858 04/03/14 0501  WBC 6.9 7.7  HGB 9.9* 9.6*  HCT 30.4* 28.9*  PLT 276 271   BMET  Recent Labs  04/02/14 0858 04/03/14 0501  NA 138 138  K 3.9 3.9  CL 107 104  CO2 23 27  GLUCOSE 76 133*  BUN 14 10  CREATININE 0.81 0.67  CALCIUM 8.6 8.4     Studies/Results: Dg Chest 2 View  04/01/2014   CLINICAL DATA:  Shortness of Breath today.  EXAM: CHEST  2 VIEW  COMPARISON:  09/14/2007  FINDINGS: Prior CABG. There are low lung volumes. Bibasilar airspace opacities are noted which likely reflect atelectasis although early infiltrate/ pneumonia cannot be excluded in the right lung base. No effusions. No acute bony abnormality.  IMPRESSION: Low lung volumes with bibasilar atelectasis or infiltrates.   Electronically Signed   By: Rolm Baptise M.D.   On: 04/01/2014 11:04   Ct Angio Chest Pe W/cm &/or Wo Cm  04/02/2014   CLINICAL DATA:  Shortness of breath.  EXAM: CT ANGIOGRAPHY CHEST WITH CONTRAST  TECHNIQUE: Multidetector CT imaging of the  chest was performed using the standard protocol during bolus administration of intravenous contrast. Multiplanar CT image reconstructions and MIPs were obtained to evaluate the vascular anatomy.  CONTRAST:  171mL OMNIPAQUE IOHEXOL 350 MG/ML SOLN  COMPARISON:  Chest x-ray dated 04/01/2014  FINDINGS: There are no pulmonary emboli. Heart size is normal. No effusions. There is focal atelectasis in the right lower lobe and to a lesser degree in the left lower lobe. Emphysematous changes present in both upper lobes.  No acute osseous abnormality. Prior CABG. No hilar or mediastinal adenopathy. Atherosclerotic disease in the descending thoracic aorta.  Review of the MIP images confirms the above findings.  IMPRESSION: No pulmonary emboli. Bilateral lower lobe atelectasis, right more than left.   Electronically Signed   By: Lorriane Shire M.D.   On: 04/02/2014 11:08   Ct Abdomen Pelvis W Contrast  04/02/2014   CLINICAL DATA:  Evaluate for small bowel obstruction. Specifically, any evidence of small bowel between the mesh and abdominal wall.  EXAM: CT ABDOMEN AND PELVIS WITH CONTRAST  TECHNIQUE: Multidetector CT imaging of the abdomen and pelvis was performed using the standard protocol following bolus administration of intravenous contrast.  CONTRAST:  166mL OMNIPAQUE IOHEXOL 350 MG/ML SOLN  COMPARISON:  12/11/2013  FINDINGS: The gallbladder is minimally distended. There is mild prominence of the common bowel duct at the level of the head of the pancreas measuring 1 cm in diameter  slightly more prominent than the recent prior exam. No definite ductal stones. The liver, spleen, pancreas and adrenal glands are within normal. Minimal perihepatic fluid. Kidneys are normal in size without hydronephrosis or nephrolithiasis. There are 2 hyperdense left renal cortical masses measuring 1.5 cm over the mid pole and 1.2 cm over the lower pole unchanged and likely slightly hyperdense cysts. There is calcified plaque over the  abdominal aorta and iliac arteries. Ureters are unremarkable. Appendix is normal.  There are multiple dilated loops of small bowel to level of the approximate mid jejunum measuring up to 5 cm in diameter. No free peritoneal air. Air and stool present throughout the colon. There is a surgical suture line over a small bowel loop in the right mid abdomen anteriorly. Small bowel distal to this point appears to be decompressed. There is no abrupt transition point of small bowel, rather there is a more gradual change to more normal caliber small bowel just proximal to the suture line in the right mid abdomen likely the site of at least partial obstruction.  There is a small upper abdominal midline ventral hernia unchanged in size although not containing stranding of the herniated peritoneal fat as cannot exclude omental infarction. Over patient's previously seen midline supraumbilical ventral hernia, there is air and fluid collection measuring 2.7 x 7.4 cm in AP and transverse dimension which does not appear to communicate with the peritoneal cavity. There is also air and fluid over a separate ventral hernia just inferior to this point and just left of midline measuring 1.7 x 4 cm. These hernias have likely undergone interval repair. These collections of air and fluid may represent non infected versus infective postoperative collections.  Remaining pelvic images demonstrate surgical absence of the uterus. There is a small amount of free fluid present. The bladder and rectum are normal. There are small bilateral inguinal hernias containing only fat. There is mild subcutaneous edema over the lower abdomen/ pelvis. Remainder the exam is unchanged.  IMPRESSION: Evidence of a mid early/partial small bowel obstructive process with point of obstruction likely the surgical site over a small bowel loop in the anterior right mid abdomen. Small bowel measures up to 5 cm in diameter. Minimal free perihepatic fluid and free fluid in  the pelvis. No free peritoneal air.  Small upper abdominal midline ventral hernia unchanged in size but now containing moderate stranding of the herniated fat as cannot exclude omental infarction. Air and fluid are present over 2 more inferiorly seen supraumbilical ventral hernias without definite connection to the peritoneal cavity as patient the abundant on hernia repair. These air/fluid collections may be infected versus noninfected postoperative collections.  Minimal gallbladder dilatation of mild dilatation of the common bowel duct which measures 1 cm. No definite ductal stones. Consider right upper quadrant ultrasound if indicated for further evaluation. If  Two left renal cortical hypodensities unchanged likely slightly hyperdense cyst. Recommend a followup CT with pre and post-contrast images on elective basis.   Electronically Signed   By: Marin Olp M.D.   On: 04/02/2014 11:41   Dg Abd 2 Views  04/01/2014   CLINICAL DATA:  Abdominal distension, abdominal pain  EXAM: ABDOMEN - 2 VIEW  COMPARISON:  CT scan 12/11/2013  FINDINGS: There are distended small bowel loops with air-fluid levels in right abdomen highly suspicious for small bowel obstruction or significant ileus. No free abdominal air. Mild degenerative changes lumbar spine. Mild lumbar levoscoliosis. Midline skin staples are noted lower abdominal wall.  IMPRESSION: Distended small  bowel loops with air-fluid levels in right abdomen highly suspicious for small bowel obstruction or significant ileus. No free abdominal air.   Electronically Signed   By: Lahoma Crocker M.D.   On: 04/01/2014 10:58    Anti-infectives: Anti-infectives    Start     Dose/Rate Route Frequency Ordered Stop   03/26/14 0943  ANCEF 1 gram in 0.9% normal saline 500 mL  Status:  Discontinued       As needed 03/26/14 0944 03/26/14 1119   03/26/14 0942  ANCEF 1 gram in 0.9% normal saline 500 mL  Status:  Discontinued       As needed 03/26/14 4388 03/26/14 1119   03/26/14  0539  ceFAZolin (ANCEF) IVPB 2 g/50 mL premix     2 g 100 mL/hr over 30 Minutes Intravenous On call to O.R. 03/26/14 0539 03/26/14 0736      Assessment/Plan: s/p Procedure(s): EXPLORATORY LAPAROTOMY TOTAL HYSTERECTOMY ABDOMINAL BILATERL SALPINGO OOPHORECTOMY OMENTECTOMY LYSIS OF ADHESION HERNIA REPAIR VENTRAL ADULT Postoperative partial small bowel obstruction versus ileus. I reviewed the CT scan which shows dilated proximal bowel but gradual tapering to nondistended distal bowel without a obvious point of obstruction and no evidence of herniation of bowel above the mesh. Fluid collections in the hernia sac are expected and there is no clinical evidence of infection. She seems to be improving daily. TNA started. Would go slowly until distention resolved and will leave on clear liquids today.   LOS: 8 days    Nicodemus Denk T 04/03/2014

## 2014-04-03 NOTE — Progress Notes (Signed)
INITIAL NUTRITION ASSESSMENT  DOCUMENTATION CODES Per approved criteria  -Not Applicable   INTERVENTION: - TPN per pharmacy - RD will continue to monitor  NUTRITION DIAGNOSIS: Inadequate oral intake related to inability to eat as evidenced by clear liquid diet.   Goal: Pt to meet >/= 90% of their estimated nutrition needs   Monitor:  Weight trend, TPN tolerance, labs, I/O's  Reason for Assessment: Consult for new TPN  69 y.o. female  Admitting Dx: <principal problem not specified>  ASSESSMENT: 69 y.o F with hx of aortobifemoral bypass in 2003, bilateral ovarian masses and now with an incisional hernia-- s/p exp laparotomy, hernia repair, lysis of adhesion, total hysterectomy, and bilateral oophorectomy on 3/15. Patient is refusing NG tube placement. CT abdomen on 3/22 showed "a mid early/partial small bowel obstructive process with point of obstruction likely the surgical site over a small bowel loop in the anterior right mid abdomen." To start TPN for post-op ileus.  - Pt with flatus, without BM - Feels hungry - No nausea/vomiting - Per RN, pt tolerating TPN. Doing well with clear liquids.  Plan per pharmacy: At 1800 today:  Increase Clinimix E 5/15 to 70 ml/hr.  Continue 20% fat emulsion at 10 ml/hr.  Plan to advance as tolerated to the goal rate.  Goal rate: Clinimix E 5/15 at a goal rate of 90 ml/hr + 20% fat emulsion at 10 ml/hr to provide: 108 g/day protein, 2013 Kcal/day.  Height: Ht Readings from Last 1 Encounters:  03/26/14 5\' 9"  (1.753 m)    Weight: Wt Readings from Last 1 Encounters:  04/03/14 198 lb 3.2 oz (89.903 kg)    Ideal Body Weight: 145 lbs  % Ideal Body Weight: 137%  Wt Readings from Last 10 Encounters:  04/03/14 198 lb 3.2 oz (89.903 kg)  03/15/14 186 lb 9.6 oz (84.641 kg)  01/07/14 186 lb 1.6 oz (84.414 kg)  11/27/13 186 lb 11.2 oz (84.687 kg)    BMI:  Body mass index is 29.26 kg/(m^2).  Estimated Nutritional Needs: Kcal:  1900-2100 Protein: 110-120 g Fluid: 2.0L/day  Skin: closed incision on abdomen  Diet Order: Diet clear liquid TPN (CLINIMIX-E) Adult .TPN (CLINIMIX-E) Adult  EDUCATION NEEDS: -Education needs addressed   Intake/Output Summary (Last 24 hours) at 04/03/14 1149 Last data filed at 04/03/14 0953  Gross per 24 hour  Intake 2554.58 ml  Output   5100 ml  Net -2545.42 ml    Last BM: 3/20   Labs:   Recent Labs Lab 04/01/14 1350 04/02/14 0858 04/02/14 0900 04/03/14 0501  NA 144 138  --  138  K 5.1 3.9  --  3.9  CL 112 107  --  104  CO2 24 23  --  27  BUN 13 14  --  10  CREATININE 0.85 0.81  --  0.67  CALCIUM 9.3 8.6  --  8.4  MG  --   --  1.8 2.0  PHOS  --   --  3.2 2.7  GLUCOSE 117* 76  --  133*    CBG (last 3)   Recent Labs  04/02/14 1718 04/02/14 2328 04/03/14 0538  GLUCAP 90 115* 120*    Scheduled Meds: . amLODipine  10 mg Oral Daily   And  . irbesartan  300 mg Oral Daily  . aspirin  81 mg Oral q morning - 10a  . colesevelam  1,875 mg Oral BID  . enoxaparin (LOVENOX) injection  40 mg Subcutaneous Q24H  . escitalopram  20 mg  Oral q morning - 10a  . ezetimibe  10 mg Oral q morning - 10a  . hydrochlorothiazide  25 mg Oral Daily  . insulin aspart  0-9 Units Subcutaneous 4 times per day  . pantoprazole  40 mg Oral Daily  . rosuvastatin  40 mg Oral q1800    Continuous Infusions: . Marland KitchenTPN (CLINIMIX-E) Adult     And  . fat emulsion    . dextrose 5 % and 0.45 % NaCl with KCl 20 mEq/L 75 mL/hr at 04/03/14 0854  . dextrose 5 % and 0.45 % NaCl with KCl 20 mEq/L    . Marland KitchenTPN (CLINIMIX-E) Adult 40 mL/hr at 04/02/14 1716   And  . fat emulsion 240 mL (04/02/14 1716)    Past Medical History  Diagnosis Date  . HTN (hypertension)   . Hyperlipidemia   . CAD (coronary artery disease)     2009 RCA occluded proximally, LAD occluded proximally, left main 90% stenosis, LIMA to the LAD widely patent, SVG to OM and diagonal patent, SVG RCA patent. This stenosis after  the anastomosis of 80% in the native vessel. This was treated with PCI.  Marland Kitchen Collagen vascular disease     Past Surgical History  Procedure Laterality Date  . Coronary artery bypass graft  1994    LIMA to the LAD, SVG to RCA, SVG sequential to OM and diagonal,  . Abdominal aortic aneurysm repair  2003  . Tonsillectomy    . Colon surgery  2007    blockage  . Ventral hernia repair  03/26/2014    Procedure: HERNIA REPAIR VENTRAL ADULT;  Surgeon: Excell Seltzer, MD;  Location: [facility];  Service: General;;  With MESH  . Laparotomy N/A 03/26/2014    Procedure: EXPLORATORY LAPAROTOMY;  Surgeon: Everitt Amber, MD;  Location: [facility];  Service: Gynecology;  Laterality: N/A;  . Abdominal hysterectomy N/A 03/26/2014    Procedure: TOTAL HYSTERECTOMY ABDOMINAL;  Surgeon: Everitt Amber, MD;  Location: [facility];  Service: Gynecology;  Laterality: N/A;  . Salpingoophorectomy Bilateral 03/26/2014    Procedure: BILATERL SALPINGO OOPHORECTOMY;  Surgeon: Everitt Amber, MD;  Location: [facility];  Service: Gynecology;  Laterality: Bilateral;  . Omentectomy  03/26/2014    Procedure: OMENTECTOMY;  Surgeon: Everitt Amber, MD;  Location: [facility];  Service: Gynecology;;  . Lysis of adhesion  03/26/2014    Procedure: LYSIS OF ADHESION;  Surgeon: Everitt Amber, MD;  Location: [facility];  Service: Gynecology;;    Laurette Schimke [place], [place], [place]

## 2014-04-04 LAB — GLUCOSE, CAPILLARY
Glucose-Capillary: 114 mg/dL — ABNORMAL HIGH (ref 70–99)
Glucose-Capillary: 116 mg/dL — ABNORMAL HIGH (ref 70–99)
Glucose-Capillary: 126 mg/dL — ABNORMAL HIGH (ref 70–99)

## 2014-04-04 LAB — COMPREHENSIVE METABOLIC PANEL
ALT: 15 U/L (ref 0–35)
ANION GAP: 8 (ref 5–15)
AST: 19 U/L (ref 0–37)
Albumin: 2.8 g/dL — ABNORMAL LOW (ref 3.5–5.2)
Alkaline Phosphatase: 44 U/L (ref 39–117)
BUN: 13 mg/dL (ref 6–23)
CHLORIDE: 100 mmol/L (ref 96–112)
CO2: 28 mmol/L (ref 19–32)
Calcium: 8.7 mg/dL (ref 8.4–10.5)
Creatinine, Ser: 0.7 mg/dL (ref 0.50–1.10)
GFR calc Af Amer: >90 mL/min (ref >90)
GFR calc non Af Amer: 87 mL/min — ABNORMAL LOW (ref >90)
Glucose, Bld: 125 mg/dL — ABNORMAL HIGH (ref 70–99)
POTASSIUM: 3.6 mmol/L (ref 3.5–5.1)
SODIUM: 136 mmol/L (ref 135–145)
TOTAL PROTEIN: 5.6 g/dL — AB (ref 6.0–8.3)
Total Bilirubin: 0.2 mg/dL — ABNORMAL LOW (ref 0.3–1.2)

## 2014-04-04 LAB — PHOSPHORUS: Phosphorus: 3.9 mg/dL (ref 2.3–4.6)

## 2014-04-04 LAB — MAGNESIUM: Magnesium: 1.7 mg/dL (ref 1.5–2.5)

## 2014-04-04 MED ORDER — TRACE MINERALS CR-CU-F-FE-I-MN-MO-SE-ZN IV SOLN
INTRAVENOUS | Status: DC
  Administered 2014-04-04: 17:00:00 via INTRAVENOUS
  Filled 2014-04-04: qty 1920

## 2014-04-04 MED ORDER — OXYCODONE HCL 5 MG PO TABS
5.0000 mg | ORAL_TABLET | ORAL | Status: DC | PRN
  Administered 2014-04-04 – 2014-04-05 (×4): 5 mg via ORAL
  Administered 2014-04-05 (×3): 10 mg via ORAL
  Filled 2014-04-04 (×2): qty 1
  Filled 2014-04-04 (×2): qty 2
  Filled 2014-04-04 (×4): qty 1

## 2014-04-04 MED ORDER — FAT EMULSION 20 % IV EMUL
240.0000 mL | INTRAVENOUS | Status: DC
  Administered 2014-04-04: 240 mL via INTRAVENOUS
  Filled 2014-04-04: qty 250

## 2014-04-04 MED ORDER — POTASSIUM CHLORIDE 10 MEQ/100ML IV SOLN
10.0000 meq | INTRAVENOUS | Status: AC
  Administered 2014-04-04 (×2): 10 meq via INTRAVENOUS
  Filled 2014-04-04 (×2): qty 100

## 2014-04-04 NOTE — Progress Notes (Signed)
PARENTERAL NUTRITION CONSULT NOTE - Follow up  Pharmacy Consult for TPN Indication: post-operative partial SBO vs ileus  No Known Allergies  Patient Measurements: Height: 5\' 9"  (175.3 cm) Weight: 189 lb 8 oz (85.957 kg) IBW/kg (Calculated) : 66.2 Adjusted Body Weight: 73 kg  Vital Signs: Temp: 98.7 F (37.1 C) (03/24 0546) Temp Source: Oral (03/24 0546) BP: 116/59 mmHg (03/24 0546) Pulse Rate: 77 (03/24 0546) Intake/Output from previous day: 03/23 0701 - 03/24 0700 In: 582.5 [I.V.:582.5] Out: 5400 [Urine:5400] Intake/Output from this shift: Total I/O In: 120 [P.O.:120] Out: 400 [Urine:400]  Labs:  Recent Labs  04/01/14 1350 04/02/14 0858 04/03/14 0501  WBC 8.1 6.9 7.7  HGB 9.9* 9.9* 9.6*  HCT 30.9* 30.4* 28.9*  PLT 293 276 271     Recent Labs  04/02/14 0858 04/02/14 0900 04/03/14 0501 04/04/14 0451  NA 138  --  138 136  K 3.9  --  3.9 3.6  CL 107  --  104 100  CO2 23  --  27 28  GLUCOSE 76  --  133* 125*  BUN 14  --  10 13  CREATININE 0.81  --  0.67 0.70  CALCIUM 8.6  --  8.4 8.7  MG  --  1.8 2.0 1.7  PHOS  --  3.2 2.7 3.9  PROT  --   --  5.5* 5.6*  ALBUMIN  --   --  2.8* 2.8*  AST  --   --  21 19  ALT  --   --  16 15  ALKPHOS  --   --  46 44  BILITOT  --   --  0.4 0.2*  TRIG  --   --  191*  --   Corrected calcium 9.7 Estimated Creatinine Clearance: 78.7 mL/min (by C-G formula based on Cr of 0.7).    Recent Labs  04/03/14 1759 04/03/14 2331 04/04/14 0543  GLUCAP 116* 103* 114*    Medical History: Past Medical History  Diagnosis Date  . HTN (hypertension)   . Hyperlipidemia   . CAD (coronary artery disease)     2009 RCA occluded proximally, LAD occluded proximally, left main 90% stenosis, LIMA to the LAD widely patent, SVG to OM and diagonal patent, SVG RCA patent. This stenosis after the anastomosis of 80% in the native vessel. This was treated with PCI.  Marland Kitchen Collagen vascular disease      Insulin Requirements in the past 24 hours:   1 unit Novolog sliding scale coverage  Current Nutrition:  Clear liquid diet Clinimix-E 5/15 at 70 mL/hr Fat Emulsion 20% at 10 mL/hr  IVF:  Discontinued by MD today  Central access: PICC placed 04/02/14 TPN start date: 04/02/14  ASSESSMENT                                                                                                          Patient is a 69 y.o F with hx of aortobifemoral bypass in 2003, bilateral ovarian masses and now with an incisional hernia-- s/p exp laparotomy, hernia repair, lysis of adhesion,  total hysterectomy, and bilateral oophorectomy on 3/15.  Patient is refusing NG tube placement. CT abdomen on 3/22 showed "a mid early/partial small bowel obstructive process with point of obstruction likely the surgical site over a small bowel loop in the anterior right mid abdomen." To start TPN for post-op ileus.  HPI: hernia, aortobifemoral bypass in 2003, depression, GERD, HTN, HLP, lysis of adhesions and small bowel stricturoplasty  Significant events:  - Surgery on 3/15 - CT abd on 3/22 with early small bowel obstruction - 3/24: Passing flatus, abd distention improving. Surgery recommends going slowly and keeping on clear liquids for today, checking abdominal XR.  May consider advancing diet tomorrow.  Today:   Glucose:  Serum glucose and CBGs remain between 100-150   Electrolytes: WNL, but K falling  Renal: SCr and BUN WNL,stable  LFTs: all below ULN   TGs: slightly elevated 3/23 but not enough to warrant adjusting lipid rate  Prealbumin: (3/23) still in process  NUTRITIONAL GOALS                                                                                             RD recs (3/23):  1900 - 2100 KCal/day, 110 - 120 g protein/day   Clinimix E 5/15 at a goal rate of 90 ml/hr + 20% fat emulsion at 10 ml/hr would provide 108 g/day protein, 2013 Kcal/day.  PLAN                                                                                                                          1. KCl 10 mEq IV q1h x 2 2. At 1800 today:  Increase Clinimix-E 5/15 to 80 ml/hr.  Continue 20% fat emulsion at 10 ml/hr.  TPN to contain standard multivitamins and trace elements. 3. Cont CBGs and sensitive-scale SSI q6h. 4. BMet tomorrow 5. TPN lab panels on Mondays & Thursdays. 6. F/U tomorrow on tolerance of clear liquids and potential for advancing diet.   Clayburn Pert, PharmD, BCPS Pager: [phone] 04/04/2014  9:50 AM

## 2014-04-04 NOTE — Progress Notes (Signed)
9 Days Post-Op Procedure(s) (LRB): EXPLORATORY LAPAROTOMY (N/A) TOTAL HYSTERECTOMY ABDOMINAL (N/A) BILATERL SALPINGO OOPHORECTOMY (Bilateral) OMENTECTOMY LYSIS OF ADHESION HERNIA REPAIR VENTRAL ADULT  Subjective: Patient reports improvement in distension. She is routinely passing flatus. She is hungry. She denies nausea. She does not have diarrhea.  Awaiting Xray abdo ordered by Dr Excell Seltzer prior to advancing diet. Tolerating clears.  Objective: Vital signs in last 24 hours: Temp:  [98.2 F (36.8 C)-98.9 F (37.2 C)] 98.9 F (37.2 C) (03/24 1317) Pulse Rate:  [72-79] 78 (03/24 1317) Resp:  [16-18] 16 (03/24 1317) BP: (112-131)/(53-81) 112/81 mmHg (03/24 1317) SpO2:  [97 %-98 %] 98 % (03/24 1317) Weight:  [189 lb 8 oz (85.957 kg)] 189 lb 8 oz (85.957 kg) (03/24 0546) Last BM Date: 04/04/14  Intake/Output from previous day: 03/23 0701 - 03/24 0700 In: 582.5 [I.V.:582.5] Out: 5400 [Urine:5400]   Physical Examination: General: alert, cooperative and no distress Resp: diminished in the bases, no crackles or rales noted Cardio: systolic murmur noted, regular rate and rhythm noted GI: incision: dressing removed, midline incision with staples with no drainage or erythema. Mild distension. + bowel sounds   Extremities: mild edema noted bilaterally, mildly pitting-less than 1+, left calf mildly larger than the right, negative homan's sign  WBC/Hgb/Hct/Plts:  6.9/9.9/30.4/276 (03/22 0858). BUN/Cr/glu/ALT/AST/amyl/lip:  14/0.81/--/--/--/--/-- (03/22 0858) CBC    Component Value Date/Time   WBC 7.7 04/03/2014 0501   RBC 3.25* 04/03/2014 0501   HGB 9.6* 04/03/2014 0501   HCT 28.9* 04/03/2014 0501   PLT 271 04/03/2014 0501   MCV 88.9 04/03/2014 0501   MCH 29.5 04/03/2014 0501   MCHC 33.2 04/03/2014 0501   RDW 13.5 04/03/2014 0501   LYMPHSABS 2.1 04/03/2014 0501   MONOABS 1.1* 04/03/2014 0501   EOSABS 0.1 04/03/2014 0501   BASOSABS 0.0 04/03/2014 0501    CMP     Component  Value Date/Time   NA 136 04/04/2014 0451   K 3.6 04/04/2014 0451   CL 100 04/04/2014 0451   CO2 28 04/04/2014 0451   GLUCOSE 125* 04/04/2014 0451   BUN 13 04/04/2014 0451   CREATININE 0.70 04/04/2014 0451   CALCIUM 8.7 04/04/2014 0451   PROT 5.6* 04/04/2014 0451   ALBUMIN 2.8* 04/04/2014 0451   AST 19 04/04/2014 0451   ALT 15 04/04/2014 0451   ALKPHOS 44 04/04/2014 0451   BILITOT 0.2* 04/04/2014 0451   GFRNONAA 87* 04/04/2014 0451   GFRAA >90 04/04/2014 0451      CT Angiogram: IMPRESSION: No pulmonary emboli. Bilateral lower lobe atelectasis, right more than left.  CT AP: IMPRESSION: Evidence of a mid early/partial small bowel obstructive process with point of obstruction likely the surgical site over a small bowel loop in the anterior right mid abdomen. Small bowel measures up to 5 cm in diameter. Minimal free perihepatic fluid and free fluid in the pelvis. No free peritoneal air.  Small upper abdominal midline ventral hernia unchanged in size but now containing moderate stranding of the herniated fat as cannot exclude omental infarction. Air and fluid are present over 2 more inferiorly seen supraumbilical ventral hernias without definite connection to the peritoneal cavity as patient the abundant on hernia repair. These air/fluid collections may be infected versus noninfected postoperative collections.  Minimal gallbladder dilatation of mild dilatation of the common bowel duct which measures 1 cm. No definite ductal stones. Consider right upper quadrant ultrasound if indicated for further evaluation. If  Two left renal cortical hypodensities unchanged likely slightly hyperdense cyst. Recommend a followup  CT with pre and post-contrast images on elective basis.  Assessment: 69 y.o. s/p Procedure(s): EXPLORATORY LAPAROTOMY TOTAL HYSTERECTOMY ABDOMINAL BILATERL SALPINGO OOPHORECTOMY OMENTECTOMY LYSIS OF ADHESION HERNIA REPAIR VENTRAL ADULT: stable Pain:   Pain is well-controlled on PRN medications.  Heme: Last CBC on 04/03/14.  Stable post-operatively.  CV: BP and HR stable post-operatively.  Continue to monitor with ordered vital signs.  Amlodipine/Valsartan ordered.  Resp: CT angio negative for PE.   GI:  Tolerating po: Yes, clear liquids.  Improving GI function. Would advance diet if films show no major obstructive process as patient has objective clinical evidence of normalized bowel function.  GU:  Adequate output reported.    FEN: Stable post-operatively.  Prophylaxis: pharmacologic prophylaxis (with any of the following: enoxaparin (Lovenox) 40mg  SQ 2 hours prior to surgery then every day) and intermittent pneumatic compression boots.  Plan:  Continue clears until xray back.  Continue TPN per pharmacy protocol Lovenox teaching kit for discharge Encourage ambulation, IS use, deep breathing, and coughing Moving towards anticipated discharge to home in 24-48 hours.   LOS: 9 days    Donaciano Eva 04/04/2014, 2:01 PM

## 2014-04-04 NOTE — Progress Notes (Signed)
NUTRITION FOLLOW-UP       INTERVENTION: - Continue TPN per pharmacy - Diet advancement per surgery - RD will continue to monitor  NUTRITION DIAGNOSIS: Inadequate oral intake related to inability to eat as evidenced by clear liquid diet; ongoing  Goal: Pt to meet >/= 90% of their estimated nutrition needs; improving with TPN  Monitor:  Weight trend, TPN tolerance, labs, I/O's  ASSESSMENT: 69 y.o F with hx of aortobifemoral bypass in 2003, bilateral ovarian masses and now with an incisional hernia-- s/p exp laparotomy, hernia repair, lysis of adhesion, total hysterectomy, and bilateral oophorectomy on 3/15. Patient is refusing NG tube placement. CT abdomen on 3/22 showed "a mid early/partial small bowel obstructive process with point of obstruction likely the surgical site over a small bowel loop in the anterior right mid abdomen." To start TPN for post-op ileus.  - Pt with flatus and BM - Feels hungry - No nausea/vomiting - Pt tolerating TPN. Doing well with clear liquids. Per surgery, diet not to be advanced yet.   Currently TPN running at 70 mL/hr with 20% fat emulsion at 10 mL/hr  Plan per pharmacy: At 1800 today:  Increase Clinimix-E 5/15 to 80 ml/hr.  Continue 20% fat emulsion at 10 ml/hr.  TPN to contain standard multivitamins and trace elements.  Goal rate: Clinimix E 5/15 at a goal rate of 90 ml/hr + 20% fat emulsion at 10 ml/hr to provide: 108 g/day protein, 2013 Kcal/day.  Height: Ht Readings from Last 1 Encounters:  03/26/14 5\' 9"  (1.753 m)    Weight: Wt Readings from Last 1 Encounters:  04/04/14 189 lb 8 oz (85.957 kg)    Wt Readings from Last 10 Encounters:  04/04/14 189 lb 8 oz (85.957 kg)  03/15/14 186 lb 9.6 oz (84.641 kg)  01/07/14 186 lb 1.6 oz (84.414 kg)  11/27/13 186 lb 11.2 oz (84.687 kg)    BMI:  Body mass index is 27.97 kg/(m^2).  Estimated Nutritional Needs: Kcal: 1900-2100 Protein: 110-120 g Fluid: 2.0L/day  Skin: closed  incision on abdomen  Diet Order: Diet clear liquid .TPN (CLINIMIX-E) Adult .TPN (CLINIMIX-E) Adult  EDUCATION NEEDS: -Education needs addressed   Intake/Output Summary (Last 24 hours) at 04/04/14 1227 Last data filed at 04/04/14 1058  Gross per 24 hour  Intake    130 ml  Output   4200 ml  Net  -4070 ml    Last BM: 3/24  Labs:   Recent Labs Lab 04/02/14 0858 04/02/14 0900 04/03/14 0501 04/04/14 0451  NA 138  --  138 136  K 3.9  --  3.9 3.6  CL 107  --  104 100  CO2 23  --  27 28  BUN 14  --  10 13  CREATININE 0.81  --  0.67 0.70  CALCIUM 8.6  --  8.4 8.7  MG  --  1.8 2.0 1.7  PHOS  --  3.2 2.7 3.9  GLUCOSE 76  --  133* 125*    CBG (last 3)   Recent Labs  04/03/14 2331 04/04/14 0543 04/04/14 1141  GLUCAP 103* 114* 116*    Scheduled Meds: . amLODipine  10 mg Oral Daily   And  . irbesartan  300 mg Oral Daily  . aspirin  81 mg Oral q morning - 10a  . colesevelam  1,875 mg Oral BID  . enoxaparin (LOVENOX) injection  40 mg Subcutaneous Q24H  . escitalopram  20 mg Oral q morning - 10a  . ezetimibe  10 mg  Oral q morning - 10a  . hydrochlorothiazide  25 mg Oral Daily  . insulin aspart  0-9 Units Subcutaneous 4 times per day  . pantoprazole  40 mg Oral Daily  . potassium chloride  10 mEq Intravenous Q1 Hr x 2  . rosuvastatin  40 mg Oral q1800    Continuous Infusions: . Marland KitchenTPN (CLINIMIX-E) Adult 70 mL/hr at 04/03/14 1738   And  . fat emulsion 240 mL (04/03/14 1738)  . Marland KitchenTPN (CLINIMIX-E) Adult     And  . fat emulsion      Past Medical History  Diagnosis Date  . HTN (hypertension)   . Hyperlipidemia   . CAD (coronary artery disease)     2009 RCA occluded proximally, LAD occluded proximally, left main 90% stenosis, LIMA to the LAD widely patent, SVG to OM and diagonal patent, SVG RCA patent. This stenosis after the anastomosis of 80% in the native vessel. This was treated with PCI.  Marland Kitchen Collagen vascular disease     Past Surgical History  Procedure  Laterality Date  . Coronary artery bypass graft  1994    LIMA to the LAD, SVG to RCA, SVG sequential to OM and diagonal,  . Abdominal aortic aneurysm repair  2003  . Tonsillectomy    . Colon surgery  2007    blockage  . Ventral hernia repair  03/26/2014    Procedure: HERNIA REPAIR VENTRAL ADULT;  Surgeon: Excell Seltzer, MD;  Location: [facility];  Service: General;;  With MESH  . Laparotomy N/A 03/26/2014    Procedure: EXPLORATORY LAPAROTOMY;  Surgeon: Everitt Amber, MD;  Location: [facility];  Service: Gynecology;  Laterality: N/A;  . Abdominal hysterectomy N/A 03/26/2014    Procedure: TOTAL HYSTERECTOMY ABDOMINAL;  Surgeon: Everitt Amber, MD;  Location: [facility];  Service: Gynecology;  Laterality: N/A;  . Salpingoophorectomy Bilateral 03/26/2014    Procedure: BILATERL SALPINGO OOPHORECTOMY;  Surgeon: Everitt Amber, MD;  Location: [facility];  Service: Gynecology;  Laterality: Bilateral;  . Omentectomy  03/26/2014    Procedure: OMENTECTOMY;  Surgeon: Everitt Amber, MD;  Location: [facility];  Service: Gynecology;;  . Lysis of adhesion  03/26/2014    Procedure: LYSIS OF ADHESION;  Surgeon: Everitt Amber, MD;  Location: [facility];  Service: Gynecology;;    Laurette Schimke [place], [place], [place]

## 2014-04-04 NOTE — Progress Notes (Signed)
Patient ID: CLORENE NERIO, female   DOB: 23-Nov-1945, 69 y.o.   MRN: [id number] 9 Days Post-Op  Subjective: Continues to feel better daily.  Has had further flatus in the bowel movement. Feels her abdominal distention is improved. Has quite a bit of general soreness particularly with moving. Tolerating clear liquids without nausea or pain  Objective: Vital signs in last 24 hours: Temp:  [98.3 F (36.8 C)-98.7 F (37.1 C)] 98.7 F (37.1 C) (03/24 0546) Pulse Rate:  [72-79] 77 (03/24 0546) Resp:  [16-18] 16 (03/24 0546) BP: (110-131)/(53-69) 116/59 mmHg (03/24 0546) SpO2:  [97 %-100 %] 98 % (03/24 0546) Weight:  [85.957 kg (189 lb 8 oz)] 85.957 kg (189 lb 8 oz) (03/24 0546) Last BM Date: 04/01/14  Intake/Output from previous day: 03/23 0701 - 03/24 0700 In: 582.5 [I.V.:582.5] Out: 5400 [Urine:5400] Intake/Output this shift:    General appearance: alert, cooperative and no distress GI: minimal distention, soft and nontender, gradually improving daily Incision/Wound: no erythema or drainage  Lab Results:   Recent Labs  04/02/14 0858 04/03/14 0501  WBC 6.9 7.7  HGB 9.9* 9.6*  HCT 30.4* 28.9*  PLT 276 271   BMET  Recent Labs  04/03/14 0501 04/04/14 0451  NA 138 136  K 3.9 3.6  CL 104 100  CO2 27 28  GLUCOSE 133* 125*  BUN 10 13  CREATININE 0.67 0.70  CALCIUM 8.4 8.7     Studies/Results: Ct Angio Chest Pe W/cm &/or Wo Cm  04/02/2014   CLINICAL DATA:  Shortness of breath.  EXAM: CT ANGIOGRAPHY CHEST WITH CONTRAST  TECHNIQUE: Multidetector CT imaging of the chest was performed using the standard protocol during bolus administration of intravenous contrast. Multiplanar CT image reconstructions and MIPs were obtained to evaluate the vascular anatomy.  CONTRAST:  150mL OMNIPAQUE IOHEXOL 350 MG/ML SOLN  COMPARISON:  Chest x-ray dated 04/01/2014  FINDINGS: There are no pulmonary emboli. Heart size is normal. No effusions. There is focal atelectasis in the right lower lobe and  to a lesser degree in the left lower lobe. Emphysematous changes present in both upper lobes.  No acute osseous abnormality. Prior CABG. No hilar or mediastinal adenopathy. Atherosclerotic disease in the descending thoracic aorta.  Review of the MIP images confirms the above findings.  IMPRESSION: No pulmonary emboli. Bilateral lower lobe atelectasis, right more than left.   Electronically Signed   By: Lorriane Shire M.D.   On: 04/02/2014 11:08   Ct Abdomen Pelvis W Contrast  04/02/2014   CLINICAL DATA:  Evaluate for small bowel obstruction. Specifically, any evidence of small bowel between the mesh and abdominal wall.  EXAM: CT ABDOMEN AND PELVIS WITH CONTRAST  TECHNIQUE: Multidetector CT imaging of the abdomen and pelvis was performed using the standard protocol following bolus administration of intravenous contrast.  CONTRAST:  173mL OMNIPAQUE IOHEXOL 350 MG/ML SOLN  COMPARISON:  12/11/2013  FINDINGS: The gallbladder is minimally distended. There is mild prominence of the common bowel duct at the level of the head of the pancreas measuring 1 cm in diameter slightly more prominent than the recent prior exam. No definite ductal stones. The liver, spleen, pancreas and adrenal glands are within normal. Minimal perihepatic fluid. Kidneys are normal in size without hydronephrosis or nephrolithiasis. There are 2 hyperdense left renal cortical masses measuring 1.5 cm over the mid pole and 1.2 cm over the lower pole unchanged and likely slightly hyperdense cysts. There is calcified plaque over the abdominal aorta and iliac arteries. Ureters are unremarkable.  Appendix is normal.  There are multiple dilated loops of small bowel to level of the approximate mid jejunum measuring up to 5 cm in diameter. No free peritoneal air. Air and stool present throughout the colon. There is a surgical suture line over a small bowel loop in the right mid abdomen anteriorly. Small bowel distal to this point appears to be decompressed.  There is no abrupt transition point of small bowel, rather there is a more gradual change to more normal caliber small bowel just proximal to the suture line in the right mid abdomen likely the site of at least partial obstruction.  There is a small upper abdominal midline ventral hernia unchanged in size although not containing stranding of the herniated peritoneal fat as cannot exclude omental infarction. Over patient's previously seen midline supraumbilical ventral hernia, there is air and fluid collection measuring 2.7 x 7.4 cm in AP and transverse dimension which does not appear to communicate with the peritoneal cavity. There is also air and fluid over a separate ventral hernia just inferior to this point and just left of midline measuring 1.7 x 4 cm. These hernias have likely undergone interval repair. These collections of air and fluid may represent non infected versus infective postoperative collections.  Remaining pelvic images demonstrate surgical absence of the uterus. There is a small amount of free fluid present. The bladder and rectum are normal. There are small bilateral inguinal hernias containing only fat. There is mild subcutaneous edema over the lower abdomen/ pelvis. Remainder the exam is unchanged.  IMPRESSION: Evidence of a mid early/partial small bowel obstructive process with point of obstruction likely the surgical site over a small bowel loop in the anterior right mid abdomen. Small bowel measures up to 5 cm in diameter. Minimal free perihepatic fluid and free fluid in the pelvis. No free peritoneal air.  Small upper abdominal midline ventral hernia unchanged in size but now containing moderate stranding of the herniated fat as cannot exclude omental infarction. Air and fluid are present over 2 more inferiorly seen supraumbilical ventral hernias without definite connection to the peritoneal cavity as patient the abundant on hernia repair. These air/fluid collections may be infected versus  noninfected postoperative collections.  Minimal gallbladder dilatation of mild dilatation of the common bowel duct which measures 1 cm. No definite ductal stones. Consider right upper quadrant ultrasound if indicated for further evaluation. If  Two left renal cortical hypodensities unchanged likely slightly hyperdense cyst. Recommend a followup CT with pre and post-contrast images on elective basis.   Electronically Signed   By: Marin Olp M.D.   On: 04/02/2014 11:41    Anti-infectives: Anti-infectives    Start     Dose/Rate Route Frequency Ordered Stop   03/26/14 0943  ANCEF 1 gram in 0.9% normal saline 500 mL  Status:  Discontinued       As needed 03/26/14 0944 03/26/14 1119   03/26/14 0942  ANCEF 1 gram in 0.9% normal saline 500 mL  Status:  Discontinued       As needed 03/26/14 2297 03/26/14 1119   03/26/14 0539  ceFAZolin (ANCEF) IVPB 2 g/50 mL premix     2 g 100 mL/hr over 30 Minutes Intravenous On call to O.R. 03/26/14 0539 03/26/14 0736      Assessment/Plan: s/p Procedure(s): EXPLORATORY LAPAROTOMY TOTAL HYSTERECTOMY ABDOMINAL BILATERL SALPINGO OOPHORECTOMY OMENTECTOMY LYSIS OF ADHESION HERNIA REPAIR VENTRAL ADULT Postoperative partial small bowel obstruction. Clinically appears to be improving. On TNA. Would go slowly. Check abdominal x-rays  tomorrow and consider advancing diet if there is improvement.   LOS: 9 days    Yemaya Barnier T 04/04/2014

## 2014-04-05 ENCOUNTER — Inpatient Hospital Stay (HOSPITAL_COMMUNITY): Payer: Medicare Other

## 2014-04-05 LAB — BASIC METABOLIC PANEL
Anion gap: 7 (ref 5–15)
BUN: 18 mg/dL (ref 6–23)
CO2: 30 mmol/L (ref 19–32)
CREATININE: 0.79 mg/dL (ref 0.50–1.10)
Calcium: 8.8 mg/dL (ref 8.4–10.5)
Chloride: 98 mmol/L (ref 96–112)
GFR, EST NON AFRICAN AMERICAN: 84 mL/min — AB (ref >90)
GLUCOSE: 125 mg/dL — AB (ref 70–99)
Potassium: 3.9 mmol/L (ref 3.5–5.1)
Sodium: 135 mmol/L (ref 135–145)

## 2014-04-05 LAB — GLUCOSE, CAPILLARY
GLUCOSE-CAPILLARY: 108 mg/dL — AB (ref 70–99)
Glucose-Capillary: 110 mg/dL — ABNORMAL HIGH (ref 70–99)
Glucose-Capillary: 117 mg/dL — ABNORMAL HIGH (ref 70–99)

## 2014-04-05 MED ORDER — TRAMADOL HCL 50 MG PO TABS: 50.0000 mg | ORAL_TABLET | Freq: Four times a day (QID) | ORAL | Status: DC | PRN

## 2014-04-05 MED ORDER — DEXTROSE-NACL 5-0.45 % IV SOLN
INTRAVENOUS | Status: DC
  Administered 2014-04-05: 10:00:00 via INTRAVENOUS

## 2014-04-05 MED ORDER — ALPRAZOLAM 0.25 MG PO TABS: 0.2500 mg | ORAL_TABLET | Freq: Two times a day (BID) | ORAL | Status: DC | PRN

## 2014-04-05 MED ORDER — IBUPROFEN 600 MG PO TABS: 600.0000 mg | ORAL_TABLET | Freq: Four times a day (QID) | ORAL | Status: DC | PRN

## 2014-04-05 MED ORDER — ONDANSETRON HCL 4 MG PO TABS: 4.0000 mg | ORAL_TABLET | Freq: Three times a day (TID) | ORAL | Status: DC | PRN

## 2014-04-05 MED ORDER — ACETAMINOPHEN 500 MG PO TABS: 1000.0000 mg | ORAL_TABLET | Freq: Four times a day (QID) | ORAL | Status: DC | PRN

## 2014-04-05 MED ORDER — OXYCODONE HCL 5 MG PO TABS: 5.0000 mg | ORAL_TABLET | ORAL | Status: DC | PRN

## 2014-04-05 NOTE — Progress Notes (Signed)
Patient has been given discharge instructions, With teach back and verbalized understanding, Also prescription scripts given.  Patient is alert and oriented. Seems in good spirits.   Noticed Home Lovenox kit in room. Since  No  information on about lovenox on discharge paper. Pharmacy called MD to clarify.    Patient voiced that she has been given instruction on lovenox injection and I also gave instructions on lovenox instruction.. RN instructed patient to call doctors office and get more information.  Kit was given to patient to take home.  Roland Rack., rn

## 2014-04-05 NOTE — Discharge Summary (Signed)
Physician Discharge Summary  Patient ID: MAHLANI BERNINGER MRN: [id number] DOB/AGE: 1945-06-06 69 y.o.  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 The surgery was uncomplicated. Postoperatively she developed delayed return of bowel function requiring reducing PO intake for > 7days in order to observe decreased distension. Her CT of the abdomen showed dilated loops of bowel. She began passing flatus and BM's and improved distension. On day of discharge (day 10 postop) she was tolerating a regular diet and had evidence of return of bowel function and clinically reduced distension.  Consults: none, general surgery followed as cosurgeons  Significant Diagnostic Studies: CT showed partial SBO with dilated loops of bowel but no intraperitoneal collection or abscess. No chest PE on CT for shortness of breath.  Treatments: IV hydration, TPN and surgery: see above  Discharge Exam: Blood pressure 106/60, pulse 73, temperature 98.5 F (36.9 C), temperature source Oral, resp. rate 18, height 5\' 9"  (1.753 m), weight 185 lb 10 oz (84.2 kg), SpO2 96 %. General appearance: alert and cooperative Resp: clear to auscultation bilaterally Cardio: regular rate and rhythm, S1, S2 normal, no murmur, click, rub or gallop GI: soft, non-tender; bowel sounds normal; no masses,  no organomegaly Skin: Skin color, texture, turgor normal. No rashes or lesions Incision/Wound: clean dry and in tact with staples  Disposition:   Discharge Instructions    (HEART FAILURE PATIENTS) Call MD:  Anytime you  have any of the following symptoms: 1) 3 pound weight gain in 24 hours or 5 pounds in 1 week 2) shortness of breath, with or without a dry hacking cough 3) swelling in the hands, feet or stomach 4) if you have to sleep on extra pillows at night in order to breathe.    Complete by:  As directed      Call MD for:  difficulty breathing, headache or visual disturbances    Complete by:  As directed      Call MD for:  extreme fatigue    Complete by:  As directed      Call MD for:  hives    Complete by:  As directed      Call MD for:  persistant dizziness or light-headedness    Complete by:  As directed      Call MD for:  persistant nausea and vomiting    Complete by:  As directed      Call MD for:  redness, tenderness, or signs of infection (pain, swelling, redness, odor or green/yellow discharge around incision site)    Complete by:  As directed      Call MD for:  severe uncontrolled pain    Complete by:  As directed      Call MD for:  temperature >100.4    Complete by:  As directed      Diet - low sodium heart healthy    Complete by:  As directed      Diet general    Complete by:  As directed      Driving Restrictions    Complete by:  As directed   No  driving for 7 days or until off narcotic pain medication     Increase activity slowly    Complete by:  As directed      PICC line removal    Complete by:  As directed      Remove dressing in 24 hours    Complete by:  As directed      Sexual Activity Restrictions    Complete by:  As directed   No intercourse for 6 weeks            Medication List    TAKE these medications        acetaminophen 500 MG tablet  Commonly known as:  TYLENOL  Take 2 tablets (1,000 mg total) by mouth every 6 (six) hours as needed for mild pain or moderate pain.     ALPRAZolam 0.25 MG tablet  Commonly known as:  XANAX  Take 1 tablet (0.25 mg total) by mouth 2 (two) times daily as needed for anxiety.     Amlodipine-Valsartan-HCTZ 10-320-25 MG Tabs   Take 1 tablet by mouth every morning.     aspirin 81 MG tablet  Take 81 mg by mouth every morning.     BIOTIN PO  Take 3 tablets by mouth every morning.     CRESTOR 40 MG tablet  Generic drug:  rosuvastatin  Take 40 mg by mouth every morning.     escitalopram 20 MG tablet  Commonly known as:  LEXAPRO  Take 20 mg by mouth every morning.     FISH OIL PO  Take 3 capsules by mouth 2 (two) times daily.     ibuprofen 600 MG tablet  Commonly known as:  ADVIL,MOTRIN  Take 1 tablet (600 mg total) by mouth every 6 (six) hours as needed for mild pain.     ondansetron 4 MG tablet  Commonly known as:  ZOFRAN  Take 1 tablet (4 mg total) by mouth every 8 (eight) hours as needed for nausea or vomiting.     oxyCODONE 5 MG immediate release tablet  Commonly known as:  Oxy IR/ROXICODONE  Take 1-2 tablets (5-10 mg total) by mouth every 4 (four) hours as needed for severe pain.     traMADol 50 MG tablet  Commonly known as:  ULTRAM  Take 1-2 tablets (50-100 mg total) by mouth every 6 (six) hours as needed for moderate pain or severe pain.     WELCHOL 625 MG tablet  Generic drug:  colesevelam  Take 1,875 mg by mouth 2 (two) times daily.     ZETIA 10 MG tablet  Generic drug:  ezetimibe  Take 10 mg by mouth every morning.           Follow-up Information    Follow up with Donaciano Eva, MD In 1 week.   Specialty:  Obstetrics and Gynecology   Why:  For suture removal   Contact information:   [address] [place] [address] [phone]       Signed: Donaciano Eva 04/05/2014, 5:14 PM

## 2014-04-05 NOTE — Progress Notes (Signed)
10 Days Post-Op Procedure(s) (LRB): EXPLORATORY LAPAROTOMY (N/A) TOTAL HYSTERECTOMY ABDOMINAL (N/A) BILATERL SALPINGO OOPHORECTOMY (Bilateral) OMENTECTOMY LYSIS OF ADHESION HERNIA REPAIR VENTRAL ADULT  Subjective: Patient reports continued improvement in distension. She is routinely passing flatus. She is hungry. She denies nausea and has no emesis on clears. She does not have diarrhea.  Xray abdo ordered by Dr Excell Seltzer showed unchanged bowel dilation/distension. Tolerating clears.  Objective: Vital signs in last 24 hours: Temp:  [98.5 F (36.9 C)-98.9 F (37.2 C)] 98.5 F (36.9 C) (03/25 0530) Pulse Rate:  [75-78] 75 (03/25 0530) Resp:  [16] 16 (03/25 0530) BP: (112-130)/(61-81) 127/61 mmHg (03/25 0530) SpO2:  [96 %-98 %] 96 % (03/25 0530) Weight:  [185 lb 10 oz (84.2 kg)] 185 lb 10 oz (84.2 kg) (03/25 0720) Last BM Date: 04/04/14  Intake/Output from previous day: 03/24 0701 - 03/25 0700 In: 3827 [P.O.:720; I.V.:10; IV Piggyback:200; TPN:2897] Out: 3700 [Urine:3700]   Physical Examination: General: alert, cooperative and no distress Resp: diminished in the bases, no crackles or rales noted Cardio: systolic murmur noted, regular rate and rhythm noted GI: incision: dressing removed, midline incision with staples with no drainage or erythema. Mild distension. + bowel sounds   Extremities: mild edema noted bilaterally, mildly pitting-less than 1+, left calf mildly larger than the right, negative homan's sign  WBC/Hgb/Hct/Plts:  6.9/9.9/30.4/276 (03/22 0858). BUN/Cr/glu/ALT/AST/amyl/lip:  14/0.81/--/--/--/--/-- (03/22 0858) CBC    Component Value Date/Time   WBC 7.7 04/03/2014 0501   RBC 3.25* 04/03/2014 0501   HGB 9.6* 04/03/2014 0501   HCT 28.9* 04/03/2014 0501   PLT 271 04/03/2014 0501   MCV 88.9 04/03/2014 0501   MCH 29.5 04/03/2014 0501   MCHC 33.2 04/03/2014 0501   RDW 13.5 04/03/2014 0501   LYMPHSABS 2.1 04/03/2014 0501   MONOABS 1.1* 04/03/2014 0501   EOSABS 0.1  04/03/2014 0501   BASOSABS 0.0 04/03/2014 0501    CMP     Component Value Date/Time   NA 135 04/05/2014 0550   K 3.9 04/05/2014 0550   CL 98 04/05/2014 0550   CO2 30 04/05/2014 0550   GLUCOSE 125* 04/05/2014 0550   BUN 18 04/05/2014 0550   CREATININE 0.79 04/05/2014 0550   CALCIUM 8.8 04/05/2014 0550   PROT 5.6* 04/04/2014 0451   ALBUMIN 2.8* 04/04/2014 0451   AST 19 04/04/2014 0451   ALT 15 04/04/2014 0451   ALKPHOS 44 04/04/2014 0451   BILITOT 0.2* 04/04/2014 0451   GFRNONAA 84* 04/05/2014 0550   GFRAA >90 04/05/2014 0550      CT Angiogram: IMPRESSION: No pulmonary emboli. Bilateral lower lobe atelectasis, right more than left.  CT AP: IMPRESSION: Evidence of a mid early/partial small bowel obstructive process with point of obstruction likely the surgical site over a small bowel loop in the anterior right mid abdomen. Small bowel measures up to 5 cm in diameter. Minimal free perihepatic fluid and free fluid in the pelvis. No free peritoneal air.  Small upper abdominal midline ventral hernia unchanged in size but now containing moderate stranding of the herniated fat as cannot exclude omental infarction. Air and fluid are present over 2 more inferiorly seen supraumbilical ventral hernias without definite connection to the peritoneal cavity as patient the abundant on hernia repair. These air/fluid collections may be infected versus noninfected postoperative collections.  Minimal gallbladder dilatation of mild dilatation of the common bowel duct which measures 1 cm. No definite ductal stones. Consider right upper quadrant ultrasound if indicated for further evaluation. If  Two left renal  cortical hypodensities unchanged likely slightly hyperdense cyst. Recommend a followup CT with pre and post-contrast images on elective basis.  Assessment: 69 y.o. s/p Procedure(s): EXPLORATORY LAPAROTOMY TOTAL HYSTERECTOMY ABDOMINAL BILATERL SALPINGO  OOPHORECTOMY OMENTECTOMY LYSIS OF ADHESION HERNIA REPAIR VENTRAL ADULT: stable Pain:  Pain is well-controlled on PRN medications.  Heme: Last CBC on 04/03/14.  Stable post-operatively.  CV: BP and HR stable post-operatively.  Continue to monitor with ordered vital signs.  Amlodipine/Valsartan ordered.  Resp: CT angio negative for PE.   GI:  Tolerating po: Yes, clear liquids.  Improving GI function. Radiographically has persistent ileus. However, clearly she is improved clinically and tolerating clears. Discussed case with Dr Chauncy Passy from General Surgery. I have written to advance her diet and challenge her with regular food. If she tolerates this, which I suspect she will based on clinical findings, we will move towards discharge.  GU:  Adequate output reported.    FEN: Stable post-operatively.  Prophylaxis: pharmacologic prophylaxis (with any of the following: enoxaparin (Lovenox) 40mg  SQ 2 hours prior to surgery then every day) and intermittent pneumatic compression boots.  Plan:  Regular diet. D.C TPN. Lovenox teaching kit for discharge Encourage ambulation, IS use, deep breathing, and coughing Moving towards anticipated discharge to home within 24 hours.   LOS: 10 days    Donaciano Eva 04/05/2014, 10:11 AM

## 2014-04-05 NOTE — Progress Notes (Signed)
Patient tolerating reg diet. Continues to pass flatus. No new abdominal distension. Will d.c. To home tonight. Everitt Amber, MD [phone]

## 2014-04-05 NOTE — Progress Notes (Signed)
NUTRITION FOLLOW-UP       INTERVENTION: - Wean TPN per pharmacy - Encourage adequate po intake - RD will continue to monitor  NUTRITION DIAGNOSIS: Inadequate oral intake related to inability to eat as evidenced by clear liquid diet; improving  Goal: Pt to meet >/= 90% of their estimated nutrition needs; improving with TPN and diet advancement  Monitor:  Weight trend, TPN tolerance, labs, I/O's  ASSESSMENT: 69 y.o F with hx of aortobifemoral bypass in 2003, bilateral ovarian masses and now with an incisional hernia-- s/p exp laparotomy, hernia repair, lysis of adhesion, total hysterectomy, and bilateral oophorectomy on 3/15. Patient is refusing NG tube placement. CT abdomen on 3/22 showed "a mid early/partial small bowel obstructive process with point of obstruction likely the surgical site over a small bowel loop in the anterior right mid abdomen." To start TPN for post-op ileus.  - Pt with flatus regularly and BM 3/24 - Feels hungry - No nausea/vomiting - Pt tolerating TPN. Tolerated clear liquids. Diet advanced to regular. Pt just received lunch tray and is excited to eat. Taking pictures of food.    - If pt tolerates diet, hopefully to be discharged.  - Per MD note, pharmacy to d/c TPN.  Currently TPN running at 80 mL/hr with 20% fat emulsion at 10 mL/hr.  3/24 Plan per pharmacy: At 1800 today:  Increase Clinimix-E 5/15 to 80 ml/hr.  Continue 20% fat emulsion at 10 ml/hr.  TPN to contain standard multivitamins and trace elements.  Goal rate: Clinimix E 5/15 at a goal rate of 90 ml/hr + 20% fat emulsion at 10 ml/hr to provide: 108 g/day protein, 2013 Kcal/day.   Height: Ht Readings from Last 1 Encounters:  03/26/14 5\' 9"  (1.753 m)    Weight: Wt Readings from Last 1 Encounters:  04/05/14 185 lb 10 oz (84.2 kg)    Wt Readings from Last 10 Encounters:  04/05/14 185 lb 10 oz (84.2 kg)  03/15/14 186 lb 9.6 oz (84.641 kg)  01/07/14 186 lb 1.6 oz (84.414 kg)   11/27/13 186 lb 11.2 oz (84.687 kg)    BMI:  Body mass index is 27.4 kg/(m^2).  Estimated Nutritional Needs: Kcal: 1900-2100 Protein: 110-120 g Fluid: 2.0 L/day  Skin: closed incision on abdomen  Diet Order: Diet regular Room service appropriate?: Yes; Fluid consistency:: Thin  EDUCATION NEEDS: -Education needs addressed   Intake/Output Summary (Last 24 hours) at 04/05/14 1222 Last data filed at 04/05/14 1049  Gross per 24 hour  Intake   3997 ml  Output   4400 ml  Net   -403 ml    Last BM: 3/24  Labs:   Recent Labs Lab 04/02/14 0900 04/03/14 0501 04/04/14 0451 04/05/14 0550  NA  --  138 136 135  K  --  3.9 3.6 3.9  CL  --  104 100 98  CO2  --  27 28 30   BUN  --  10 13 18   CREATININE  --  0.67 0.70 0.79  CALCIUM  --  8.4 8.7 8.8  MG 1.8 2.0 1.7  --   PHOS 3.2 2.7 3.9  --   GLUCOSE  --  133* 125* 125*    CBG (last 3)   Recent Labs  04/05/14 0001 04/05/14 0551 04/05/14 1153  GLUCAP 110* 117* 108*    Scheduled Meds: . amLODipine  10 mg Oral Daily   And  . irbesartan  300 mg Oral Daily  . aspirin  81 mg Oral q morning -  10a  . colesevelam  1,875 mg Oral BID  . enoxaparin (LOVENOX) injection  40 mg Subcutaneous Q24H  . escitalopram  20 mg Oral q morning - 10a  . ezetimibe  10 mg Oral q morning - 10a  . hydrochlorothiazide  25 mg Oral Daily  . pantoprazole  40 mg Oral Daily  . rosuvastatin  40 mg Oral q1800    Continuous Infusions: . dextrose 5 % and 0.45% NaCl 10 mL/hr at 04/05/14 1011    Past Medical History  Diagnosis Date  . HTN (hypertension)   . Hyperlipidemia   . CAD (coronary artery disease)     2009 RCA occluded proximally, LAD occluded proximally, left main 90% stenosis, LIMA to the LAD widely patent, SVG to OM and diagonal patent, SVG RCA patent. This stenosis after the anastomosis of 80% in the native vessel. This was treated with PCI.  Marland Kitchen Collagen vascular disease     Past Surgical History  Procedure Laterality Date  .  Coronary artery bypass graft  1994    LIMA to the LAD, SVG to RCA, SVG sequential to OM and diagonal,  . Abdominal aortic aneurysm repair  2003  . Tonsillectomy    . Colon surgery  2007    blockage  . Ventral hernia repair  03/26/2014    Procedure: HERNIA REPAIR VENTRAL ADULT;  Surgeon: Excell Seltzer, MD;  Location: [facility];  Service: General;;  With MESH  . Laparotomy N/A 03/26/2014    Procedure: EXPLORATORY LAPAROTOMY;  Surgeon: Everitt Amber, MD;  Location: [facility];  Service: Gynecology;  Laterality: N/A;  . Abdominal hysterectomy N/A 03/26/2014    Procedure: TOTAL HYSTERECTOMY ABDOMINAL;  Surgeon: Everitt Amber, MD;  Location: [facility];  Service: Gynecology;  Laterality: N/A;  . Salpingoophorectomy Bilateral 03/26/2014    Procedure: BILATERL SALPINGO OOPHORECTOMY;  Surgeon: Everitt Amber, MD;  Location: [facility];  Service: Gynecology;  Laterality: Bilateral;  . Omentectomy  03/26/2014    Procedure: OMENTECTOMY;  Surgeon: Everitt Amber, MD;  Location: [facility];  Service: Gynecology;;  . Lysis of adhesion  03/26/2014    Procedure: LYSIS OF ADHESION;  Surgeon: Everitt Amber, MD;  Location: [facility];  Service: Gynecology;;    Laurette Schimke Sabin, [place], [place]

## 2014-04-05 NOTE — Progress Notes (Addendum)
General Surgery Note  LOS: 10 days  POD -  10 Days Post-Op  Assessment/Plan: 1.  EXPLORATORY LAPAROTOMY, TOTAL HYSTERECTOMY ABDOMINAL BILATERL SALPINGO OOPHORECTOMY, OMENTECTOMY. LYSIS OF ADHESION - [place] [facility] [place] - 03/26/2014  Post op ileus vs SBO  She clinically looks better, but her KUB today still shows SB distention  Will keep on clear liq [Spoke to Dr. Denman George who wants to advance to reg diet - that is okay by me]  2.  On TPN 3.  HTN 4.  CAD  CABG - 1994 3. DVT prophylaxis - Lovenox   Active Problems:   Pelvic mass in female   Ventral hernia   Complex ovarian cyst   Small bowel obstruction due to postoperative adhesions  Subjective:  Doing better.  No BM last 24 hours, but she has passed some flatus.  Overall she does feel better.  She is by herself in the room.  Objective:   Filed Vitals:   04/05/14 0530  BP: 127/61  Pulse: 75  Temp: 98.5 F (36.9 C)  Resp: 16     Intake/Output from previous day:  03/24 0701 - 03/25 0700 In: 3827 [P.O.:720; I.V.:10; IV Piggyback:200; TPN:2897] Out: 3700 [Urine:3700]  Intake/Output this shift:  Total I/O In: -  Out: 400 [Urine:400]   Physical Exam:   General: WN older WF who is alert and oriented.    HEENT: Normal. Pupils equal. .   Lungs: Clear.  IS - 1,200 cc   Abdomen: Mild distention.  Has BS.   Wound: Clean.     Lab Results:    Recent Labs  04/03/14 0501  WBC 7.7  HGB 9.6*  HCT 28.9*  PLT 271    BMET   Recent Labs  04/04/14 0451 04/05/14 0550  NA 136 135  K 3.6 3.9  CL 100 98  CO2 28 30  GLUCOSE 125* 125*  BUN 13 18  CREATININE 0.70 0.79  CALCIUM 8.7 8.8    PT/INR  No results for input(s): LABPROT, INR in the last 72 hours.  ABG  No results for input(s): PHART, HCO3 in the last 72 hours.  Invalid input(s): PCO2, PO2   Studies/Results:  Dg Abd 2 Views  04/05/2014   CLINICAL DATA:  Postop hysterectomy and hernia repair. Abdominal distention but improved  tenderness.  EXAM: ABDOMEN - 2 VIEW  COMPARISON:  Abdominal CT from 4 days ago  FINDINGS: Unchanged bowel gas pattern on comparison CT, with distended gas-filled small bowel in the central abdomen. By CT, this is favored to represent a mechanical obstruction over ileus. Oral contrast not administered on comparison CT. There is a relative paucity of colonic distention. No visible pneumoperitoneum. No concerning intra-abdominal mass effect or calcification. The lung bases are clear.  IMPRESSION: Small bowel distention/ partial obstruction is unchanged since 04/02/2014.   Electronically Signed   By: Monte Fantasia M.D.   On: 04/05/2014 07:55     Anti-infectives:   Anti-infectives    Start     Dose/Rate Route Frequency Ordered Stop   03/26/14 0943  ANCEF 1 gram in 0.9% normal saline 500 mL  Status:  Discontinued       As needed 03/26/14 0944 03/26/14 1119   03/26/14 0942  ANCEF 1 gram in 0.9% normal saline 500 mL  Status:  Discontinued       As needed 03/26/14 0942 03/26/14 1119   03/26/14 0539  ceFAZolin (ANCEF) IVPB 2 g/50 mL premix     2 g  100 mL/hr over 30 Minutes Intravenous On call to O.R. 03/26/14 0539 03/26/14 0736      Alphonsa Overall, MD, FACS Pager: [facility] Office: [phone] 04/05/2014

## 2014-04-05 NOTE — Discharge Instructions (Signed)
04/05/2014  Return to work: 4 weeks  Activity: 1. Be up and out of the bed during the day.  Take a nap if needed.  You may walk up steps but be careful and use the hand rail.  Stair climbing will tire you more than you think, you may need to stop part way and rest.   2. No lifting or straining for 6 weeks.  3. No driving for 2 weeks.  Do Not drive if you are taking narcotic pain medicine.  4. Shower daily.  Use soap and water on your incision and pat dry; don't rub.   5. No sexual activity and nothing in the vagina for 6 weeks.  Diet: 1. Low sodium Heart Healthy Diet is recommended.  2. It is safe to use a laxative if you have difficulty moving your bowels.   Wound Care: 1. Keep clean and dry.  Shower daily.  Reasons to call the Doctor:   Fever - Oral temperature greater than 100.4 degrees Fahrenheit  Foul-smelling vaginal discharge  Difficulty urinating  Nausea and vomiting  Increased pain at the site of the incision that is unrelieved with pain medicine.  Difficulty breathing with or without chest pain  New calf pain especially if only on one side  Sudden, continuing increased vaginal bleeding with or without clots.  Avoid oxycodone and preferentially take tylenol or ibuprofen for pain. Use oxycodone or ultram only for severe pain.   Follow-up: 1. See Joylene John for staple removal at Dr Serita Grit office 1 week following discharge  2. See Dr Denman George 2 weeks post discharge .  Contacts: For questions or concerns you should contact:  Dr. Everitt Amber [phone]

## 2014-04-08 ENCOUNTER — Telehealth: Payer: Self-pay | Admitting: *Deleted

## 2014-04-08 MED ORDER — ENOXAPARIN SODIUM 40 MG/0.4ML ~~LOC~~ SOLN: 40.0000 mg | SUBCUTANEOUS | Status: DC

## 2014-04-08 NOTE — Telephone Encounter (Signed)
Called pt with f/u appt for April 4 at 12:00pm. Instructed pt to pick up lovenox 40mg  Rx at [facility] on [address]. Pt received 10 injections while inpatient. Pt will continue shots for 18 days. Pt confirmed pharmacy and will pick up today. Pt verbalized understanding and will call with any concerns.

## 2014-04-11 ENCOUNTER — Ambulatory Visit: Payer: Medicare Other | Attending: Gynecologic Oncology | Admitting: Gynecologic Oncology

## 2014-04-11 VITALS — BP 107/66 | HR 73 | Temp 97.6°F | Resp 18 | Ht 69.0 in | Wt 176.1 lb

## 2014-04-11 DIAGNOSIS — Z90722 Acquired absence of ovaries, bilateral: Secondary | ICD-10-CM | POA: Insufficient documentation

## 2014-04-11 DIAGNOSIS — E785 Hyperlipidemia, unspecified: Secondary | ICD-10-CM | POA: Diagnosis not present

## 2014-04-11 DIAGNOSIS — Z9861 Coronary angioplasty status: Secondary | ICD-10-CM | POA: Insufficient documentation

## 2014-04-11 DIAGNOSIS — Z48815 Encounter for surgical aftercare following surgery on the digestive system: Secondary | ICD-10-CM | POA: Insufficient documentation

## 2014-04-11 DIAGNOSIS — Z4802 Encounter for removal of sutures: Secondary | ICD-10-CM | POA: Insufficient documentation

## 2014-04-11 DIAGNOSIS — I10 Essential (primary) hypertension: Secondary | ICD-10-CM | POA: Diagnosis not present

## 2014-04-11 DIAGNOSIS — C541 Malignant neoplasm of endometrium: Secondary | ICD-10-CM | POA: Diagnosis not present

## 2014-04-11 DIAGNOSIS — C801 Malignant (primary) neoplasm, unspecified: Secondary | ICD-10-CM

## 2014-04-11 DIAGNOSIS — I2510 Atherosclerotic heart disease of native coronary artery without angina pectoris: Secondary | ICD-10-CM | POA: Insufficient documentation

## 2014-04-11 DIAGNOSIS — F17210 Nicotine dependence, cigarettes, uncomplicated: Secondary | ICD-10-CM | POA: Insufficient documentation

## 2014-04-11 DIAGNOSIS — Z951 Presence of aortocoronary bypass graft: Secondary | ICD-10-CM | POA: Diagnosis not present

## 2014-04-11 DIAGNOSIS — Z90710 Acquired absence of both cervix and uterus: Secondary | ICD-10-CM | POA: Insufficient documentation

## 2014-04-11 DIAGNOSIS — R140 Abdominal distension (gaseous): Secondary | ICD-10-CM | POA: Diagnosis not present

## 2014-04-11 DIAGNOSIS — Z7901 Long term (current) use of anticoagulants: Secondary | ICD-10-CM | POA: Diagnosis not present

## 2014-04-11 DIAGNOSIS — R1030 Lower abdominal pain, unspecified: Secondary | ICD-10-CM

## 2014-04-11 DIAGNOSIS — Z7982 Long term (current) use of aspirin: Secondary | ICD-10-CM | POA: Diagnosis not present

## 2014-04-11 DIAGNOSIS — IMO0002 Reserved for concepts with insufficient information to code with codable children: Secondary | ICD-10-CM

## 2014-04-11 MED ORDER — OXYCODONE HCL 5 MG PO TABS: 5.0000 mg | ORAL_TABLET | ORAL | Status: DC | PRN

## 2014-04-11 NOTE — Patient Instructions (Signed)
Plan to follow up as scheduled.  Contact your primary care about parameters for your blood pressure medication.  Please call for any questions or concerns and continue post-operative instructions.

## 2014-04-12 NOTE — Progress Notes (Signed)
Follow Up Note: Gyn-Onc  [name] 69 y.o. female  CC:  Chief Complaint  Patient presents with  . Suture / Staple Removal    Follow up    HPI:  [name] is a 69 year old Caucasian married female seen in consultation request of Dr. Louretta Shorten regarding management of bilateral complex adnexal masses and what appears to be an endometrial polyp with associated postmenopausal bleeding. The patient initially presented with some postmenopausal bleeding and underwent an ultrasound that showed both what appears to be an endometrial polyp as well as complex adnexal masses. The patient subsequently had a CT scan of the abdomen and pelvis that revealed no evidence of adenopathy ascites or metastatic disease. CA-125 is 16.   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 Everitt Amber and ventral hernia repair with mesh by Dr. Excell Seltzer. The surgery was uncomplicated. Postoperatively, she developed delayed return of bowel function requiring reducing PO intake for > 7days in order to observe decreased distension. Her CT of the abdomen showed dilated loops of bowel. She began passing flatus and BM's and improved distension. On day of discharge (day 10 postop) she was tolerating a regular diet and had evidence of return of bowel function and clinically reduced distension.   Interval History:  The patient presents today with her husband for staple removal and post-operative follow up.  She reports doing well since since surgery except for intermittent abdominal soreness.   She is tolerating her diet, passing flatus, and having soft stools.  Her abdominal distention has improved.  She has been taking two oxycodone 5 mg tablets for pain every six hours PRN for pain.  Voiding without difficulty.  Her husband reports her systolic blood pressures running around 104-108, which is low for her.  She states she is asymptomatic.  No issues or concerns  voiced about her abdominal incision.  No other concerns voiced.    Review of Systems: Constitutional: Feels well.  No fever, chills, early satiety, or unintentional change in weight.  Cardiovascular: No chest pain, shortness of breath, or edema.  Pulmonary: No cough or wheeze.  Gastrointestinal: No nausea, vomiting, or diarrhea. No bright red blood per rectum or change in bowel movement.  Genitourinary: No frequency, urgency, or dysuria. No vaginal bleeding or discharge.  Musculoskeletal: No myalgia or joint pain. Neurologic: No weakness, numbness, or change in gait.  Psychology: No depression, anxiety, or insomnia.  Current Meds:  Outpatient Encounter Prescriptions as of 04/11/2014  Medication Sig  . acetaminophen (TYLENOL) 500 MG tablet Take 2 tablets (1,000 mg total) by mouth every 6 (six) hours as needed for mild pain or moderate pain.  Marland Kitchen ALPRAZolam (XANAX) 0.25 MG tablet Take 1 tablet (0.25 mg total) by mouth 2 (two) times daily as needed for anxiety.  . Amlodipine-Valsartan-HCTZ 10-320-25 MG TABS Take 1 tablet by mouth every morning.   Marland Kitchen aspirin 81 MG tablet Take 81 mg by mouth every morning.   Marland Kitchen BIOTIN PO Take 3 tablets by mouth every morning.   . colesevelam (WELCHOL) 625 MG tablet Take 1,875 mg by mouth 2 (two) times daily.  Marland Kitchen enoxaparin (LOVENOX) 40 MG/0.4ML injection Inject 0.4 mLs (40 mg total) into the skin daily.  Marland Kitchen escitalopram (LEXAPRO) 20 MG tablet Take 20 mg by mouth every morning.   . ezetimibe (ZETIA) 10 MG tablet Take 10 mg by mouth every morning.   Marland Kitchen ibuprofen (ADVIL,MOTRIN) 600 MG tablet Take  1 tablet (600 mg total) by mouth every 6 (six) hours as needed for mild pain.  . Omega-3 Fatty Acids (FISH OIL PO) Take 3 capsules by mouth 2 (two) times daily.   . ondansetron (ZOFRAN) 4 MG tablet Take 1 tablet (4 mg total) by mouth every 8 (eight) hours as needed for nausea or vomiting.  Marland Kitchen oxyCODONE (OXY IR/ROXICODONE) 5 MG immediate release tablet Take 1-2 tablets (5-10 mg  total) by mouth every 4 (four) hours as needed for severe pain.  . rosuvastatin (CRESTOR) 40 MG tablet Take 40 mg by mouth every morning.   . traMADol (ULTRAM) 50 MG tablet Take 1-2 tablets (50-100 mg total) by mouth every 6 (six) hours as needed for moderate pain or severe pain.  . [DISCONTINUED] oxyCODONE (OXY IR/ROXICODONE) 5 MG immediate release tablet Take 1-2 tablets (5-10 mg total) by mouth every 4 (four) hours as needed for severe pain.    Allergy: No Known Allergies  Social Hx:   History   Social History  . Marital Status: Married    Spouse Name: N/A  . Number of Children: 3  . Years of Education: N/A   Occupational History  . Not on file.   Social History Main Topics  . Smoking status: Light Tobacco Smoker    Types: Cigarettes  . Smokeless tobacco: Not on file  . Alcohol Use: No  . Drug Use: No  . Sexual Activity: Not Currently   Other Topics Concern  . Not on file   Social History Narrative   Lives with husband and 34 year old.      Past Surgical Hx:  Past Surgical History  Procedure Laterality Date  . Coronary artery bypass graft  1994    LIMA to the LAD, SVG to RCA, SVG sequential to OM and diagonal,  . Abdominal aortic aneurysm repair  2003  . Tonsillectomy    . Colon surgery  2007    blockage  . Ventral hernia repair  03/26/2014    Procedure: HERNIA REPAIR VENTRAL ADULT;  Surgeon: Excell Seltzer, MD;  Location: [facility];  Service: General;;  With MESH  . Laparotomy N/A 03/26/2014    Procedure: EXPLORATORY LAPAROTOMY;  Surgeon: Everitt Amber, MD;  Location: [facility];  Service: Gynecology;  Laterality: N/A;  . Abdominal hysterectomy N/A 03/26/2014    Procedure: TOTAL HYSTERECTOMY ABDOMINAL;  Surgeon: Everitt Amber, MD;  Location: [facility];  Service: Gynecology;  Laterality: N/A;  . Salpingoophorectomy Bilateral 03/26/2014    Procedure: BILATERL SALPINGO OOPHORECTOMY;  Surgeon: Everitt Amber, MD;  Location: [facility];  Service: Gynecology;  Laterality: Bilateral;  .  Omentectomy  03/26/2014    Procedure: OMENTECTOMY;  Surgeon: Everitt Amber, MD;  Location: [facility];  Service: Gynecology;;  . Lysis of adhesion  03/26/2014    Procedure: LYSIS OF ADHESION;  Surgeon: Everitt Amber, MD;  Location: [facility];  Service: Gynecology;;    Past Medical Hx:  Past Medical History  Diagnosis Date  . HTN (hypertension)   . Hyperlipidemia   . CAD (coronary artery disease)     2009 RCA occluded proximally, LAD occluded proximally, left main 90% stenosis, LIMA to the LAD widely patent, SVG to OM and diagonal patent, SVG RCA patent. This stenosis after the anastomosis of 80% in the native vessel. This was treated with PCI.  Marland Kitchen Collagen vascular disease     Family Hx:  Family History  Problem Relation Age of Onset  . CAD Father 50    Died of MI  .  Hypertension Brother   . Cancer Sister     Ovarian    Vitals:  Blood pressure 107/66, pulse 73, temperature 97.6 F (36.4 C), temperature source Oral, resp. rate 18, height 5\' 9"  (1.753 m), weight 176 lb 1.6 oz (79.878 kg).  Physical Exam:  General: Well developed, well nourished female in no acute distress. Alert and oriented x 3.  Cardiovascular: Regular rate and rhythm. S1 and S2 normal.  Lungs: Clear to auscultation bilaterally. No wheezes/crackles/rhonchi noted.  Skin: No rashes or lesions present. Back: No CVA tenderness.  Abdomen: Abdomen soft, non-tender and obese. Active bowel sounds in all quadrants. No evidence of a fluid wave or abdominal masses.  18 staples removed from the midline incision without difficulty.  1/2 inch steri strips applied.  No erythema or drainage.  Extremities: No bilateral cyanosis, edema, or clubbing.   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 Everitt Amber and ventral hernia repair with mesh by Dr. Excell Seltzer.  She is doing well post-operatively.  Incisional care reinforced along with pain management including  non-pharmacologic measures.  She is to follow up with Dr. Denman George as scheduled or sooner if needed.  Reportable signs and symptoms reviewed.  She is to follow up with her PCP about her blood pressure medications and to possibly obtain parameters to follow.  She is advised to call for any questions or concerns.   CROSS, MELISSA DEAL, NP 04/12/2014, 11:54 AM

## 2014-04-15 ENCOUNTER — Ambulatory Visit: Payer: Medicare Other | Admitting: Gynecologic Oncology

## 2014-04-22 ENCOUNTER — Encounter: Payer: Self-pay | Admitting: Gynecologic Oncology

## 2014-04-22 ENCOUNTER — Ambulatory Visit: Payer: Medicare Other | Attending: Gynecologic Oncology | Admitting: Gynecologic Oncology

## 2014-04-22 VITALS — BP 94/61 | Temp 98.0°F | Resp 18

## 2014-04-22 DIAGNOSIS — Z8542 Personal history of malignant neoplasm of other parts of uterus: Secondary | ICD-10-CM | POA: Insufficient documentation

## 2014-04-22 DIAGNOSIS — R1030 Lower abdominal pain, unspecified: Secondary | ICD-10-CM

## 2014-04-22 DIAGNOSIS — C541 Malignant neoplasm of endometrium: Secondary | ICD-10-CM | POA: Diagnosis not present

## 2014-04-22 HISTORY — DX: Malignant neoplasm of endometrium: C54.1

## 2014-04-22 HISTORY — DX: Personal history of malignant neoplasm of other parts of uterus: Z85.42

## 2014-04-22 LAB — PREALBUMIN: Prealbumin: 10 mg/dL — ABNORMAL LOW (ref 17–34)

## 2014-04-22 MED ORDER — OXYCODONE HCL 5 MG PO TABS: 5.0000 mg | ORAL_TABLET | ORAL | Status: DC | PRN

## 2014-04-22 NOTE — Patient Instructions (Signed)
Followup with Dr. Denman George as scheduled in 3 months. Please call our office sooner with any additional questions or concerns.

## 2014-04-22 NOTE — Progress Notes (Signed)
POSTOPERATIVE VISIT: ENDOMETRIAL CANCER  CHIEF COMPLAINT: Chief Complaint  Patient presents with  . endometrial cancer    HPI:  [name] is a 69 y.o. year old initially seen in consultation on 01/08/15 for bilateral adnexal masses.  She then underwent an exploratory laparotomy, BSO (with hernia repair with Dr Excell Seltzer) on 8/93/81 without complications.  Her postoperative course was complicated by delayed return of bowel function (10 day hospital stay including TPN administration). 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 Her endometrium had an incidental finding of clear cell carcinoma in a polyp on final pathology (frozen section saw only benign endometrial polyp).  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  She is seen today for a postoperative check and to discuss her pathology results and treatment plan.  Since discharge from the hospital, she is feeling well.  She has improving appetite, normal bowel and bladder function, and pain controlled with minimal PO medication. She has no other complaints today.    Review of systems: Constitutional:  She has no weight gain or weight loss. She has no fever or chills. Eyes: No blurred vision Ears, Nose, Mouth, Throat: No dizziness, headaches or changes in hearing. No mouth sores. Cardiovascular: No chest pain, palpitations or edema. Respiratory:  No shortness of breath, wheezing or cough Gastrointestinal: She has normal bowel movements without diarrhea or constipation. She denies any nausea or vomiting. She denies blood in her stool or heart burn. Genitourinary:  She denies pelvic pain, pelvic pressure or changes in her urinary function. She has no hematuria,  dysuria, or incontinence. She has no irregular vaginal bleeding or vaginal discharge Musculoskeletal: Denies muscle weakness or joint pains.  Skin:  She has no skin changes, rashes or itching Neurological:  Denies dizziness or headaches. No neuropathy, no numbness or tingling. Psychiatric:  She denies depression or anxiety. Hematologic/Lymphatic:   No easy bruising or bleeding   Physical Exam: Blood pressure 94/61, temperature 98 F (36.7 C), temperature source Oral, resp. rate 18. General: Well dressed, well nourished in no apparent distress.   HEENT:  Normocephalic and atraumatic, no lesions.  Extraocular muscles intact. Sclerae anicteric. Pupils equal, round, reactive. No mouth sores or ulcers. Thyroid is normal size, not nodular, midline. Skin:  No lesions or rashes. Breasts:  deferred Lungs:  deferred. Cardiovascular: deferred Abdomen:  Soft, nontender, nondistended.  No palpable masses.  No hepatosplenomegaly.  No ascites. Normal bowel sounds.  No hernias.  Incision is well healed Genitourinary: Normal EGBUS  Vaginal cuff intact.  No bleeding or discharge.  No cul de sac fullness. Extremities: No cyanosis, clubbing or edema.  No calf tenderness or erythema. No palpable cords. Psychiatric: Mood and affect are appropriate. Neurological: Awake, alert and oriented x 3. Sensation is intact, no neuropathy.  Musculoskeletal: No pain, normal strength and range of motion.  Assessment:    69 y.o. year old with Stage IA Grade 3 clear cell  endometrial cancer (incidental finding).   S/p ex lap, TAH, BSO on 03/26/14. no LVSI, no myometrial invasion, negative pelvic washings and  lymph nodes not sampled.   Plan: 1) Pathology reports reviewed today 2) Treatment counseling - I discussed the increased risk for recurrence with a clear cell endometrial cancer. I discussed that clear cell endometrial cancers  tend to be more chemotherapy resistant. I discussed that we do not have evidence to suggest  that adjuvant therapy with chemotherapy or radiation will reduce the risk of recurrence. I discussed that high-grade endometrial cancers tend to be treated with adjuvant therapy in the setting of any myometrial invasion. However tumors that are housed exclusively with an endometrial polyp (as was the case for [name]) I generally treated with expectant management understanding that there is a measurable risk for recurrence. I'm recommending follow-up every 3 months with pelvic examinations, symptom survey, and evaluation for recurrence. Routine CT imaging, or CA-125 assessment is not indicated in the absence of symptoms. Routine vaginal cytology is also not indicated. If no recurrences been diagnosed with in 2 yearsevaluations to 6 monthly until she is 5 years post diagnosis.  3)  Return to clinic in 3 months  Donaciano Eva, MD

## 2014-04-26 ENCOUNTER — Telehealth: Payer: Self-pay | Admitting: *Deleted

## 2014-04-26 NOTE — Telephone Encounter (Signed)
Spoke with Quita Skye Pharmacist at [facility] for pt Prior Auth for Ondansetron HCL 4mg  tablet Q:20 R:0. Approximately 30 mins time spent speaking with pt pharmacy for authorization.  PA# [id number] date from 01/26/14-04/26/15  Called pt regarding Zofran, discussed rx has been authorized. Pt states " I've been nauseated now and then I dont know if it's stress or what. I get nausea when I'm stressed, and I get sick. I've been taking phillips and an antacid. I have an appointment with my hernia doctor on 4/28" Pt confirmed able to pass gas and have BM, denied vomiting.  Encouraged pt to call with concerns.

## 2014-07-25 ENCOUNTER — Ambulatory Visit: Payer: Medicare Other | Admitting: Gynecologic Oncology

## 2014-07-29 ENCOUNTER — Ambulatory Visit: Payer: Medicare Other | Attending: Gynecologic Oncology | Admitting: Gynecologic Oncology

## 2014-07-29 ENCOUNTER — Encounter: Payer: Self-pay | Admitting: Gynecologic Oncology

## 2014-07-29 VITALS — BP 120/73 | HR 84 | Temp 98.3°F | Resp 18 | Ht 69.0 in | Wt 174.0 lb

## 2014-07-29 DIAGNOSIS — C541 Malignant neoplasm of endometrium: Secondary | ICD-10-CM | POA: Diagnosis not present

## 2014-07-29 DIAGNOSIS — N951 Menopausal and female climacteric states: Secondary | ICD-10-CM

## 2014-07-29 HISTORY — DX: Menopausal and female climacteric states: N95.1

## 2014-07-29 MED ORDER — ESTROGENS CONJUGATED 0.3 MG PO TABS: 0.3000 mg | ORAL_TABLET | Freq: Every day | ORAL | Status: DC

## 2014-07-29 NOTE — Progress Notes (Signed)
FOLLOWUP VISIT: ENDOMETRIAL CANCER  CHIEF COMPLAINT: Chief Complaint  Patient presents with  . endometrial cancer   Assessment:    69 y.o. 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   S/p ex lap, TAH, BSO on 03/26/14. no LVSI, no myometrial invasion, negative pelvic washings and  lymph nodes not sampled. No adjuvant therapy necessary. No evidence of disease on followup.   Plan: 1) Hx of stage IA clear cell endometrial cancer - NED today. Continue close followup with surveillance exams every 37months. Discussed symptoms of recurrence and to notify us of these should they develop. 2) vasomotor symptoms of menopause - quality of life limiting. I discussed with Cassidee that there are 2 options for therapy. Option #1 is for a nonhormonal modulator such as venlafaxine which is somewhat less efficacious than option to which is conjugated estrogens. I discussed that taking S2 and does not carry with it an increased risk for recurrence of her endometrial cancer. However taking asked regions for greater than 5 years postmenopausally is associated with an increased risk for breast cancer. I also discussed that is associated with an increased risk for coronary vascular events and cerebrovascular events and DVT events. Given her significant past coronary and vascular history of concerns about her using systemic asked regions and recommended that she follow-up with her cardiologist Dr. Percival Spanish prior to taking these. Of note it is not a pure contraindication for use however I believe Dr. Percival Spanish should weigh in regarding the risk-benefit profile. 3)  Return to clinic in 3 months   HPI:  [name] is a 69 y.o. year old initially seen in consultation on 01/08/15 for bilateral adnexal masses.  She then underwent an exploratory laparotomy, BSO (with hernia repair with Dr Excell Seltzer) on 02/13/52 without complications.  Her postoperative course was complicated by  delayed return of bowel function (10 day hospital stay including TPN administration). 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 Her endometrium had an incidental finding of clear cell carcinoma in a polyp on final pathology (frozen section saw only benign endometrial polyp).  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  She is seen today for a surveillance examiantion. Since surgery, she is feeling well.  She has improving appetite, normal bowel and bladder function, and pain controlled with minimal PO medication.   She has complaints of severe vasomotor symptoms of menopause which are worse since the surgery.   No Known Allergies Past Medical History  Diagnosis Date  . HTN (hypertension)   . Hyperlipidemia   . CAD (coronary artery disease)     2009 RCA occluded proximally, LAD occluded proximally, left main 90% stenosis, LIMA to the LAD widely patent, SVG to OM and diagonal patent, SVG RCA patent. This stenosis after the anastomosis of 80% in the native vessel. This was treated with PCI.  Marland Kitchen Collagen vascular disease    Past Surgical History  Procedure Laterality Date  . Coronary artery bypass graft  1994    LIMA to the LAD, SVG to RCA, SVG sequential to OM and diagonal,  . Abdominal aortic aneurysm repair  2003  . Tonsillectomy    . Colon surgery  2007    blockage  . Ventral hernia  repair  03/26/2014    Procedure: HERNIA REPAIR VENTRAL ADULT;  Surgeon: Excell Seltzer, MD;  Location: [facility];  Service: General;;  With MESH  . Laparotomy N/A 03/26/2014    Procedure: EXPLORATORY LAPAROTOMY;  Surgeon: Everitt Amber, MD;  Location: [facility];  Service: Gynecology;  Laterality: N/A;  . Abdominal hysterectomy N/A  03/26/2014    Procedure: TOTAL HYSTERECTOMY ABDOMINAL;  Surgeon: Everitt Amber, MD;  Location: [facility];  Service: Gynecology;  Laterality: N/A;  . Salpingoophorectomy Bilateral 03/26/2014    Procedure: BILATERL SALPINGO OOPHORECTOMY;  Surgeon: Everitt Amber, MD;  Location: [facility];  Service: Gynecology;  Laterality: Bilateral;  . Omentectomy  03/26/2014    Procedure: OMENTECTOMY;  Surgeon: Everitt Amber, MD;  Location: [facility];  Service: Gynecology;;  . Lysis of adhesion  03/26/2014    Procedure: LYSIS OF ADHESION;  Surgeon: Everitt Amber, MD;  Location: [facility];  Service: Gynecology;;   Family History  Problem Relation Age of Onset  . CAD Father 12    Died of MI  . Hypertension Brother   . Cancer Sister     Ovarian   History   Social History  . Marital Status: Married    Spouse Name: N/A  . Number of Children: 3  . Years of Education: N/A   Occupational History  . Not on file.   Social History Main Topics  . Smoking status: Light Tobacco Smoker    Types: Cigarettes  . Smokeless tobacco: Not on file  . Alcohol Use: No  . Drug Use: No  . Sexual Activity: Not Currently   Other Topics Concern  . Not on file   Social History Narrative   Lives with husband and 15 year old.     Current Outpatient Prescriptions on File Prior to Visit  Medication Sig Dispense Refill  . Amlodipine-Valsartan-HCTZ 10-320-25 MG TABS Take 1 tablet by mouth every morning.   3  . aspirin 81 MG tablet Take 81 mg by mouth every morning.     Marland Kitchen BIOTIN PO Take 3 tablets by mouth every morning.     . colesevelam (WELCHOL) 625 MG tablet Take 1,875 mg by mouth 2 (two) times daily.    Marland Kitchen escitalopram (LEXAPRO) 20 MG tablet Take 20 mg by mouth every morning.   3  . ezetimibe (ZETIA) 10 MG tablet Take 10 mg by mouth every morning.     . Omega-3 Fatty Acids (FISH OIL PO) Take 3 capsules by mouth 2 (two) times daily.     . ondansetron (ZOFRAN) 4 MG tablet Take 1 tablet (4 mg total) by mouth every 8 (eight) hours as needed for  nausea or vomiting. 20 tablet 0  . rosuvastatin (CRESTOR) 40 MG tablet Take 40 mg by mouth every morning.     Marland Kitchen acetaminophen (TYLENOL) 500 MG tablet Take 2 tablets (1,000 mg total) by mouth every 6 (six) hours as needed for mild pain or moderate pain. (Patient not taking: Reported on 07/29/2014) 30 tablet 0  . ALPRAZolam (XANAX) 0.25 MG tablet Take 1 tablet (0.25 mg total) by mouth 2 (two) times daily as needed for anxiety. (Patient not taking: Reported on 07/29/2014) 30 tablet 0  . enoxaparin (LOVENOX) 40 MG/0.4ML injection Inject 0.4 mLs (40 mg total) into the skin daily. (Patient not taking: Reported on 07/29/2014) 18 Syringe 0  . ibuprofen (ADVIL,MOTRIN) 600 MG tablet Take 1 tablet (600 mg total) by mouth every 6 (six) hours as needed for mild pain. (Patient not taking: Reported on 07/29/2014)  30 tablet 0  . oxyCODONE (OXY IR/ROXICODONE) 5 MG immediate release tablet Take 1-2 tablets (5-10 mg total) by mouth every 4 (four) hours as needed for severe pain. (Patient not taking: Reported on 07/29/2014) 45 tablet 0  . traMADol (ULTRAM) 50 MG tablet Take 1-2 tablets (50-100 mg total) by mouth every 6 (six) hours as needed for moderate pain or severe pain. (Patient not taking: Reported on 07/29/2014) 30 tablet 0   No current facility-administered medications on file prior to visit.    Review of systems: Constitutional:  She has no weight gain or weight loss. She has no fever or chills. Eyes: No blurred vision Ears, Nose, Mouth, Throat: No dizziness, headaches or changes in hearing. No mouth sores. Cardiovascular: No chest pain, palpitations or edema. Respiratory:  No shortness of breath, wheezing or cough Gastrointestinal: She has normal bowel movements without diarrhea or constipation. She denies any nausea or vomiting. She denies blood in her stool or heart burn. Genitourinary:  She denies pelvic pain, pelvic pressure or changes in her urinary function. She has no hematuria, dysuria, or incontinence.  She has no irregular vaginal bleeding or vaginal discharge Musculoskeletal: Denies muscle weakness or joint pains.  Skin:  She has no skin changes, rashes or itching Neurological:  Denies dizziness or headaches. No neuropathy, no numbness or tingling. Psychiatric:  She denies depression or anxiety. Hematologic/Lymphatic:   No easy bruising or bleeding   Physical Exam: Blood pressure 120/73, pulse 84, temperature 98.3 F (36.8 C), temperature source Oral, resp. rate 18, height 5\' 9"  (1.753 m), weight 174 lb (78.926 kg), SpO2 96 %. General: Well dressed, well nourished in no apparent distress.   HEENT:  Normocephalic and atraumatic, no lesions.  Extraocular muscles intact. Sclerae anicteric. Pupils equal, round, reactive. No mouth sores or ulcers. Thyroid is normal size, not nodular, midline. Skin:  No lesions or rashes. Lymph nodes: no cervical or inguinal adenopathy Breasts:  deferred Lungs:  CTAB Cardiovascular: RRR Abdomen:  Soft, nontender, nondistended.  No palpable masses.  No hepatosplenomegaly.  No ascites. Normal bowel sounds.  No hernias.  Incision is well healed Genitourinary: Normal EGBUS  Vaginal cuff intact.  No bleeding or discharge.  No cul de sac fullness. Extremities: No cyanosis, clubbing or edema.  No calf tenderness or erythema. No palpable cords. Psychiatric: Mood and affect are appropriate. Neurological: Awake, alert and oriented x 3. Sensation is intact, no neuropathy.  Musculoskeletal: No pain, normal strength and range of motion.  Donaciano Eva, MD

## 2014-07-29 NOTE — Patient Instructions (Signed)
We will see you back in our clinic in 3 months. Please call with any questions or new or worsening symptoms.

## 2014-08-30 DIAGNOSIS — Z23 Encounter for immunization: Secondary | ICD-10-CM

## 2014-08-30 HISTORY — DX: Encounter for immunization: Z23

## 2014-10-28 ENCOUNTER — Ambulatory Visit: Payer: Medicare Other | Admitting: Gynecologic Oncology

## 2014-11-01 ENCOUNTER — Encounter: Payer: Self-pay | Admitting: Gynecologic Oncology

## 2014-11-01 ENCOUNTER — Ambulatory Visit: Payer: Medicare Other | Attending: Gynecologic Oncology | Admitting: Gynecologic Oncology

## 2014-11-01 VITALS — BP 126/70 | HR 90 | Temp 98.0°F | Resp 20 | Ht 69.0 in | Wt 179.3 lb

## 2014-11-01 DIAGNOSIS — Z8542 Personal history of malignant neoplasm of other parts of uterus: Secondary | ICD-10-CM

## 2014-11-01 DIAGNOSIS — N951 Menopausal and female climacteric states: Secondary | ICD-10-CM | POA: Diagnosis not present

## 2014-11-01 DIAGNOSIS — R232 Flushing: Secondary | ICD-10-CM | POA: Insufficient documentation

## 2014-11-01 DIAGNOSIS — C541 Malignant neoplasm of endometrium: Secondary | ICD-10-CM | POA: Insufficient documentation

## 2014-11-01 MED ORDER — ESTRADIOL 0.5 MG PO TABS: 0.5000 mg | ORAL_TABLET | Freq: Every day | ORAL | Status: DC

## 2014-11-01 NOTE — Patient Instructions (Signed)
Plan to follow up with Dr. Denman George in three months or sooner if needed.  You can also plan to see Dr. Corinna Capra in April 2017.  We will send the new prescription for the increased dose of your estrogen to [facility].

## 2014-11-01 NOTE — Progress Notes (Signed)
FOLLOWUP VISIT: ENDOMETRIAL CANCER  CHIEF COMPLAINT: Chief Complaint  Patient presents with  . Endometrial Cancer    Follow up   Assessment:    69 y.o. 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   S/p ex lap, TAH, BSO on 03/26/14. no LVSI, no myometrial invasion, negative pelvic washings and  lymph nodes not sampled. No adjuvant therapy necessary. No evidence of disease on followup.   Plan: 1) Hx of stage IA clear cell endometrial cancer - NED today. Continue close followup with surveillance exams every 74months. Discussed symptoms of recurrence and to notify us of these should they develop. 2) vasomotor symptoms of menopause - quality of life limiting. Persistent. We will increase premarin dose to 0.5mg  daily.  I again discussed with Berthe that there are cardiovascular risks associated with prolonged estrogen exposure. Given her significant past coronary and vascular history of concerns about her using systemic asked regions and recommended that she follow-up with her cardiologist Dr. Percival Spanish while taking these.  3)  Return to clinic in 3 months   HPI:  [name] is a 69 y.o. year old initially seen in consultation on 01/08/15 for bilateral adnexal masses.  She then underwent an exploratory laparotomy, BSO (with hernia repair with Dr Excell Seltzer) on 9/44/96 without complications.  Her postoperative course was complicated by delayed return of bowel function (10 day hospital stay including TPN administration). 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 Her endometrium had an incidental finding of clear cell carcinoma in a polyp on final pathology (frozen section saw only benign endometrial polyp).  Her final pathologic diagnosis is a Stage IA Grade 3 clear cell endometrial cancer (incompletely staged as a lymphadenectomy was not performed due to the benign findings on frozen section) with no lymphovascular space invasion, no  myometrial invasion and a 1.2cm tumor contained entirely within an endometrial polyp.  Interval Hx:  She is seen today for a surveillance examiantion. She denies symptoms concerning for recurrence including no vaginal bleeding, no lower extremity edema, no abdominal pain or bloating.  She has complaints of persistent vasomotor symptoms of menopause which are not completely treated with 0.3mg  premarin.   No Known Allergies Past Medical History  Diagnosis Date  . HTN (hypertension)   . Hyperlipidemia   . CAD (coronary artery disease)     2009 RCA occluded proximally, LAD occluded proximally, left main 90% stenosis, LIMA to the LAD widely patent, SVG to OM and diagonal patent, SVG RCA patent. This stenosis after the anastomosis of 80% in the native vessel. This was treated with PCI.  Marland Kitchen Collagen vascular disease [facility])    Past Surgical History  Procedure Laterality Date  . Coronary artery bypass graft  1994    LIMA to the LAD, SVG to RCA, SVG sequential to OM and diagonal,  . Abdominal aortic aneurysm repair  2003  . Tonsillectomy    . Colon surgery  2007    blockage  . Ventral hernia repair  03/26/2014    Procedure: HERNIA REPAIR VENTRAL ADULT;  Surgeon: Excell Seltzer, MD;  Location: [facility];  Service: General;;  With MESH  . Laparotomy N/A 03/26/2014    Procedure: EXPLORATORY LAPAROTOMY;  Surgeon: Everitt Amber, MD;  Location: [facility];  Service: Gynecology;  Laterality: N/A;  . Abdominal hysterectomy N/A 03/26/2014    Procedure: TOTAL HYSTERECTOMY ABDOMINAL;  Surgeon: Everitt Amber, MD;  Location: [facility];  Service: Gynecology;  Laterality: N/A;  . Salpingoophorectomy  Bilateral 03/26/2014    Procedure: BILATERL SALPINGO OOPHORECTOMY;  Surgeon: Everitt Amber, MD;  Location: [facility];  Service: Gynecology;  Laterality: Bilateral;  . Omentectomy  03/26/2014    Procedure: OMENTECTOMY;  Surgeon: Everitt Amber, MD;  Location: [facility];  Service: Gynecology;;  . Lysis of adhesion  03/26/2014    Procedure: LYSIS  OF ADHESION;  Surgeon: Everitt Amber, MD;  Location: [facility];  Service: Gynecology;;   Family History  Problem Relation Age of Onset  . CAD Father 79    Died of MI  . Hypertension Brother   . Cancer Sister     Ovarian   Social History   Social History  . Marital Status: Married    Spouse Name: N/A  . Number of Children: 3  . Years of Education: N/A   Occupational History  . Not on file.   Social History Main Topics  . Smoking status: Light Tobacco Smoker    Types: Cigarettes  . Smokeless tobacco: Not on file  . Alcohol Use: No  . Drug Use: No  . Sexual Activity: Not Currently   Other Topics Concern  . Not on file   Social History Narrative   Lives with husband and 45 year old.     Current Outpatient Prescriptions on File Prior to Visit  Medication Sig Dispense Refill  . Amlodipine-Valsartan-HCTZ 10-320-25 MG TABS Take 1 tablet by mouth every morning.   3  . BIOTIN PO Take 3 tablets by mouth every morning.     . colesevelam (WELCHOL) 625 MG tablet Take 1,875 mg by mouth 2 (two) times daily.    Marland Kitchen escitalopram (LEXAPRO) 20 MG tablet Take 20 mg by mouth every morning.   3  . ezetimibe (ZETIA) 10 MG tablet Take 10 mg by mouth every morning.     Marland Kitchen ibuprofen (ADVIL,MOTRIN) 600 MG tablet Take 1 tablet (600 mg total) by mouth every 6 (six) hours as needed for mild pain. (Patient not taking: Reported on 07/29/2014) 30 tablet 0  . Omega-3 Fatty Acids (FISH OIL PO) Take 3 capsules by mouth 2 (two) times daily.     . rosuvastatin (CRESTOR) 40 MG tablet Take 40 mg by mouth every morning.      No current facility-administered medications on file prior to visit.    Review of systems: Constitutional:  She has no weight gain or weight loss. She has no fever or chills. Eyes: No blurred vision Ears, Nose, Mouth, Throat: No dizziness, headaches or changes in hearing. No mouth sores. Cardiovascular: No chest pain, palpitations or edema. Respiratory:  No shortness of breath, wheezing or  cough Gastrointestinal: She has normal bowel movements without diarrhea or constipation. She denies any nausea or vomiting. She denies blood in her stool or heart burn. Genitourinary:  She denies pelvic pain, pelvic pressure or changes in her urinary function. She has no hematuria, dysuria, or incontinence. She has no irregular vaginal bleeding or vaginal discharge Musculoskeletal: Denies muscle weakness or joint pains.  Skin:  She has no skin changes, rashes or itching Neurological:  Denies dizziness or headaches. No neuropathy, no numbness or tingling. Psychiatric:  She denies depression or anxiety. Hematologic/Lymphatic:   No easy bruising or bleeding   Physical Exam: Blood pressure 126/70, pulse 90, temperature 98 F (36.7 C), temperature source Oral, resp. rate 20, height 5\' 9"  (1.753 m), weight 179 lb 4.8 oz (81.33 kg), SpO2 96 %. General: Well dressed, well nourished in no apparent distress.   HEENT:  Normocephalic and  atraumatic, no lesions.  Extraocular muscles intact. Sclerae anicteric. Pupils equal, round, reactive. No mouth sores or ulcers. Thyroid is normal size, not nodular, midline. Skin:  No lesions or rashes. Lymph nodes: no cervical or inguinal adenopathy Breasts:  deferred Lungs:  CTAB Cardiovascular: RRR Abdomen:  Soft, nontender, nondistended.  No palpable masses.  No hepatosplenomegaly.  No ascites. Normal bowel sounds.  No hernias.  Incision is well healed Genitourinary: Normal EGBUS  Vaginal cuff intact.  No bleeding or discharge.  No cul de sac fullness. Extremities: No cyanosis, clubbing or edema.  No calf tenderness or erythema. No palpable cords. Psychiatric: Mood and affect are appropriate. Neurological: Awake, alert and oriented x 3. Sensation is intact, no neuropathy.  Musculoskeletal: No pain, normal strength and range of motion.  Donaciano Eva, MD

## 2015-02-03 ENCOUNTER — Ambulatory Visit: Payer: Medicare Other | Admitting: Gynecologic Oncology

## 2015-02-14 ENCOUNTER — Ambulatory Visit: Payer: Medicare Other | Admitting: Gynecologic Oncology

## 2015-03-10 ENCOUNTER — Ambulatory Visit: Payer: Medicare Other | Admitting: Gynecologic Oncology

## 2015-03-28 ENCOUNTER — Ambulatory Visit: Payer: Medicare Other | Attending: Gynecologic Oncology | Admitting: Gynecologic Oncology

## 2015-04-04 ENCOUNTER — Ambulatory Visit: Payer: Medicare Other | Admitting: Gynecology

## 2015-06-06 ENCOUNTER — Encounter: Payer: Self-pay | Admitting: Gynecologic Oncology

## 2015-06-06 ENCOUNTER — Ambulatory Visit: Payer: Medicare Other | Attending: Gynecologic Oncology | Admitting: Gynecologic Oncology

## 2015-06-06 VITALS — BP 95/59 | HR 91 | Temp 98.4°F | Resp 17 | Ht 69.0 in | Wt 178.7 lb

## 2015-06-06 DIAGNOSIS — F17210 Nicotine dependence, cigarettes, uncomplicated: Secondary | ICD-10-CM | POA: Insufficient documentation

## 2015-06-06 DIAGNOSIS — Z7982 Long term (current) use of aspirin: Secondary | ICD-10-CM | POA: Diagnosis not present

## 2015-06-06 DIAGNOSIS — N958 Other specified menopausal and perimenopausal disorders: Secondary | ICD-10-CM | POA: Diagnosis not present

## 2015-06-06 DIAGNOSIS — Z90710 Acquired absence of both cervix and uterus: Secondary | ICD-10-CM | POA: Diagnosis not present

## 2015-06-06 DIAGNOSIS — Z8249 Family history of ischemic heart disease and other diseases of the circulatory system: Secondary | ICD-10-CM | POA: Insufficient documentation

## 2015-06-06 DIAGNOSIS — I10 Essential (primary) hypertension: Secondary | ICD-10-CM | POA: Diagnosis not present

## 2015-06-06 DIAGNOSIS — Z8041 Family history of malignant neoplasm of ovary: Secondary | ICD-10-CM | POA: Diagnosis not present

## 2015-06-06 DIAGNOSIS — Z8679 Personal history of other diseases of the circulatory system: Secondary | ICD-10-CM | POA: Insufficient documentation

## 2015-06-06 DIAGNOSIS — E785 Hyperlipidemia, unspecified: Secondary | ICD-10-CM | POA: Diagnosis not present

## 2015-06-06 DIAGNOSIS — C541 Malignant neoplasm of endometrium: Secondary | ICD-10-CM | POA: Insufficient documentation

## 2015-06-06 DIAGNOSIS — I2510 Atherosclerotic heart disease of native coronary artery without angina pectoris: Secondary | ICD-10-CM | POA: Diagnosis not present

## 2015-06-06 DIAGNOSIS — Z951 Presence of aortocoronary bypass graft: Secondary | ICD-10-CM | POA: Insufficient documentation

## 2015-06-06 DIAGNOSIS — I998 Other disorder of circulatory system: Secondary | ICD-10-CM | POA: Diagnosis not present

## 2015-06-06 DIAGNOSIS — Z8542 Personal history of malignant neoplasm of other parts of uterus: Secondary | ICD-10-CM

## 2015-06-06 NOTE — Progress Notes (Signed)
FOLLOWUP VISIT: ENDOMETRIAL CANCER  CHIEF COMPLAINT: Chief Complaint  Patient presents with  . Follow-up    endometrial cancer   Assessment:    70 y.o. 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   S/p ex lap, TAH, BSO on 03/26/14. no LVSI, no myometrial invasion, negative pelvic washings and  lymph nodes not sampled. No adjuvant therapy necessary. No evidence of disease on followup.   Plan: 1) Hx of stage IA clear cell endometrial cancer - NED today. Continue close followup with surveillance exams every 53months for another year before transitioning to 6 monthly visits. Discussed symptoms of recurrence and to notify us of these should they develop. 2) vasomotor symptoms of menopause - stable at 0.5mg  premarin daily.  3)  Return to clinic in 3 months to see Dr Corinna Capra and in 6 months with me.  HPI:  [name] is a 70 y.o. year old initially seen in consultation on 01/08/15 for bilateral adnexal masses.  She then underwent an exploratory laparotomy, BSO (with hernia repair with Dr Excell Seltzer) on Q000111Q without complications.  Her postoperative course was complicated by delayed return of bowel function (10 day hospital stay including TPN administration). 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 Her endometrium had an incidental finding of clear cell carcinoma in a polyp on final pathology (frozen section saw only benign endometrial polyp).  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  Interval Hx:  She is seen today for a surveillance examiantion. She denies symptoms concerning for recurrence including no vaginal bleeding, no lower extremity edema, no abdominal pain  or bloating.   No Known Allergies Past Medical History  Diagnosis Date  . HTN (hypertension)   . Hyperlipidemia   . CAD (coronary artery disease)     2009 RCA occluded proximally, LAD occluded proximally, left main 90% stenosis, LIMA to the LAD widely patent, SVG to OM and diagonal patent, SVG RCA patent. This stenosis after the anastomosis of 80% in the native vessel. This was treated with PCI.  Marland Kitchen Collagen vascular disease [facility])    Past Surgical History  Procedure Laterality Date  . Coronary artery bypass graft  1994    LIMA to the LAD, SVG to RCA, SVG sequential to OM and diagonal,  . Abdominal aortic aneurysm repair  2003  . Tonsillectomy    . Colon surgery  2007    blockage  . Ventral hernia repair  03/26/2014    Procedure: HERNIA REPAIR VENTRAL ADULT;  Surgeon: Excell Seltzer, MD;  Location: [facility];  Service: General;;  With MESH  . Laparotomy N/A 03/26/2014    Procedure: EXPLORATORY LAPAROTOMY;  Surgeon: Everitt Amber, MD;  Location: [facility];  Service: Gynecology;  Laterality: N/A;  . Abdominal hysterectomy N/A 03/26/2014    Procedure: TOTAL HYSTERECTOMY ABDOMINAL;  Surgeon: Everitt Amber, MD;  Location: [facility];  Service: Gynecology;  Laterality: N/A;  . Salpingoophorectomy Bilateral 03/26/2014    Procedure: BILATERL SALPINGO OOPHORECTOMY;  Surgeon: Everitt Amber, MD;  Location: [facility];  Service: Gynecology;  Laterality: Bilateral;  . Omentectomy  03/26/2014    Procedure: OMENTECTOMY;  Surgeon: Everitt Amber, MD;  Location: [facility];  Service: Gynecology;;  . Lysis of adhesion  03/26/2014  Procedure: LYSIS OF ADHESION;  Surgeon: Everitt Amber, MD;  Location: [facility];  Service: Gynecology;;   Family History  Problem Relation Age of Onset  . CAD Father 3    Died of MI  . Hypertension Brother   . Cancer Sister     Ovarian   Social History   Social History  . Marital Status: Married    Spouse Name: N/A  . Number of Children: 3  . Years of Education: N/A   Occupational History  . Not  on file.   Social History Main Topics  . Smoking status: Light Tobacco Smoker -- 0.25 packs/day    Types: Cigarettes  . Smokeless tobacco: Not on file  . Alcohol Use: No  . Drug Use: No  . Sexual Activity: Not Currently   Other Topics Concern  . Not on file   Social History Narrative   Lives with husband and 81 year old.     Current Outpatient Prescriptions on File Prior to Visit  Medication Sig Dispense Refill  . Amlodipine-Valsartan-HCTZ 10-320-25 MG TABS Take 1 tablet by mouth every morning.   3  . aspirin 325 MG tablet Take 325 mg by mouth daily.    Marland Kitchen BIOTIN PO Take 3 tablets by mouth every morning.     . colesevelam (WELCHOL) 625 MG tablet Take 1,875 mg by mouth 2 (two) times daily.    Marland Kitchen escitalopram (LEXAPRO) 20 MG tablet Take 20 mg by mouth every morning.   3  . estradiol (ESTRACE) 0.5 MG tablet Take 1 tablet (0.5 mg total) by mouth daily. 30 tablet 11  . ezetimibe (ZETIA) 10 MG tablet Take 10 mg by mouth every morning.     Marland Kitchen ibuprofen (ADVIL,MOTRIN) 600 MG tablet Take 1 tablet (600 mg total) by mouth every 6 (six) hours as needed for mild pain. 30 tablet 0  . Omega-3 Fatty Acids (FISH OIL PO) Take 3 capsules by mouth 2 (two) times daily.     . rosuvastatin (CRESTOR) 40 MG tablet Take 40 mg by mouth every morning.      No current facility-administered medications on file prior to visit.    Review of systems: Constitutional:  She has no weight gain or weight loss. She has no fever or chills. Eyes: No blurred vision Ears, Nose, Mouth, Throat: No dizziness, headaches or changes in hearing. No mouth sores. Cardiovascular: No chest pain, palpitations or edema. Respiratory:  No shortness of breath, wheezing or cough Gastrointestinal: She has normal bowel movements without diarrhea or constipation. She denies any nausea or vomiting. She denies blood in her stool or heart burn. Genitourinary:  She denies pelvic pain, pelvic pressure or changes in her urinary function. She has  no hematuria, dysuria, or incontinence. She has no irregular vaginal bleeding or vaginal discharge Musculoskeletal: Denies muscle weakness or joint pains.  Skin:  She has no skin changes, rashes or itching Neurological:  Denies dizziness or headaches. No neuropathy, no numbness or tingling. Psychiatric:  She denies depression or anxiety. Hematologic/Lymphatic:   No easy bruising or bleeding   Physical Exam: Blood pressure 95/59, pulse 91, temperature 98.4 F (36.9 C), temperature source Oral, resp. rate 17, height 5\' 9"  (1.753 m), weight 178 lb 11.2 oz (81.058 kg), SpO2 99 %. General: Well dressed, well nourished in no apparent distress.   HEENT:  Normocephalic and atraumatic, no lesions.  Extraocular muscles intact. Sclerae anicteric. Pupils equal, round, reactive. No mouth sores or ulcers. Thyroid is normal size, not nodular, midline. Skin:  No lesions or rashes. Lymph nodes: no cervical or inguinal adenopathy Breasts:  deferred Lungs:  CTAB Cardiovascular: RRR Abdomen:  Soft, nontender, nondistended.  No palpable masses.  No hepatosplenomegaly.  No ascites. Normal bowel sounds.  No hernias.  Incision is well healed Genitourinary: Normal EGBUS  Vaginal cuff intact.  No bleeding or discharge.  No cul de sac fullness. Extremities: No cyanosis, clubbing or edema.  No calf tenderness or erythema. No palpable cords. Psychiatric: Mood and affect are appropriate. Neurological: Awake, alert and oriented x 3. Sensation is intact, no neuropathy.  Musculoskeletal: No pain, normal strength and range of motion.  Donaciano Eva, MD

## 2015-06-06 NOTE — Patient Instructions (Signed)
Plan to follow up in six months or sooner if needed.  Please call for any questions or concerns. 

## 2015-11-26 ENCOUNTER — Other Ambulatory Visit (HOSPITAL_BASED_OUTPATIENT_CLINIC_OR_DEPARTMENT_OTHER): Payer: Medicare Other

## 2015-11-26 ENCOUNTER — Encounter: Payer: Self-pay | Admitting: Gynecologic Oncology

## 2015-11-26 ENCOUNTER — Ambulatory Visit: Payer: Medicare Other | Attending: Gynecologic Oncology | Admitting: Gynecologic Oncology

## 2015-11-26 VITALS — BP 130/69 | HR 81 | Temp 97.8°F | Resp 18 | Ht 69.0 in | Wt 188.1 lb

## 2015-11-26 DIAGNOSIS — F17210 Nicotine dependence, cigarettes, uncomplicated: Secondary | ICD-10-CM | POA: Insufficient documentation

## 2015-11-26 DIAGNOSIS — I10 Essential (primary) hypertension: Secondary | ICD-10-CM | POA: Insufficient documentation

## 2015-11-26 DIAGNOSIS — C541 Malignant neoplasm of endometrium: Secondary | ICD-10-CM

## 2015-11-26 DIAGNOSIS — I2510 Atherosclerotic heart disease of native coronary artery without angina pectoris: Secondary | ICD-10-CM | POA: Insufficient documentation

## 2015-11-26 DIAGNOSIS — Z951 Presence of aortocoronary bypass graft: Secondary | ICD-10-CM | POA: Insufficient documentation

## 2015-11-26 DIAGNOSIS — Z7982 Long term (current) use of aspirin: Secondary | ICD-10-CM | POA: Insufficient documentation

## 2015-11-26 DIAGNOSIS — Z8542 Personal history of malignant neoplasm of other parts of uterus: Secondary | ICD-10-CM | POA: Diagnosis not present

## 2015-11-26 DIAGNOSIS — Z08 Encounter for follow-up examination after completed treatment for malignant neoplasm: Secondary | ICD-10-CM | POA: Insufficient documentation

## 2015-11-26 DIAGNOSIS — Z90710 Acquired absence of both cervix and uterus: Secondary | ICD-10-CM | POA: Insufficient documentation

## 2015-11-26 DIAGNOSIS — N899 Noninflammatory disorder of vagina, unspecified: Secondary | ICD-10-CM | POA: Insufficient documentation

## 2015-11-26 DIAGNOSIS — N951 Menopausal and female climacteric states: Secondary | ICD-10-CM | POA: Diagnosis not present

## 2015-11-26 DIAGNOSIS — Z79899 Other long term (current) drug therapy: Secondary | ICD-10-CM | POA: Insufficient documentation

## 2015-11-26 DIAGNOSIS — Z8249 Family history of ischemic heart disease and other diseases of the circulatory system: Secondary | ICD-10-CM | POA: Insufficient documentation

## 2015-11-26 DIAGNOSIS — Z90722 Acquired absence of ovaries, bilateral: Secondary | ICD-10-CM | POA: Diagnosis not present

## 2015-11-26 DIAGNOSIS — Z8041 Family history of malignant neoplasm of ovary: Secondary | ICD-10-CM | POA: Insufficient documentation

## 2015-11-26 DIAGNOSIS — E785 Hyperlipidemia, unspecified: Secondary | ICD-10-CM | POA: Insufficient documentation

## 2015-11-26 DIAGNOSIS — K5900 Constipation, unspecified: Secondary | ICD-10-CM | POA: Diagnosis not present

## 2015-11-26 DIAGNOSIS — R1900 Intra-abdominal and pelvic swelling, mass and lump, unspecified site: Secondary | ICD-10-CM

## 2015-11-26 LAB — BASIC METABOLIC PANEL
Anion Gap: 9 mEq/L (ref 3–11)
BUN: 16 mg/dL (ref 7.0–26.0)
CO2: 23 meq/L (ref 22–29)
Calcium: 9.3 mg/dL (ref 8.4–10.4)
Chloride: 109 mEq/L (ref 98–109)
Creatinine: 1 mg/dL (ref 0.6–1.1)
EGFR: 56 mL/min/{1.73_m2} — ABNORMAL LOW (ref >90)
GLUCOSE: 94 mg/dL (ref 70–140)
POTASSIUM: 3.3 meq/L — AB (ref 3.5–5.1)
Sodium: 141 mEq/L (ref 136–145)

## 2015-11-26 NOTE — Progress Notes (Signed)
FOLLOWUP VISIT: ENDOMETRIAL CANCER  CHIEF COMPLAINT: Chief Complaint  Patient presents with  . Endometrial cancer    Follow up   Assessment:    70 y.o. 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   S/p ex lap, TAH, BSO on 03/26/14. no LVSI, no myometrial invasion, negative pelvic washings and  lymph nodes not sampled. No adjuvant therapy administered.   Plan: 1) Hx of stage IA clear cell endometrial cancer - Palpable deep nodularity on pelvic exam. Recommend CT abdo/pelvis to better delineate. This could be retained stool (patient has constipation). 2) vasomotor symptoms of menopause - stable at 0.5mg  premarin daily.  3)  Return to clinic in 6 months to see me. Then will see Dr Corinna Capra 6 months after that.  HPI:  [name] is a 70 y.o. year old initially seen in consultation on 01/08/15 for bilateral adnexal masses.  She then underwent an exploratory laparotomy, BSO (with hernia repair with Dr Excell Seltzer) on Q000111Q without complications.  Her postoperative course was complicated by delayed return of bowel function (10 day hospital stay including TPN administration). 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 Her endometrium had an incidental finding of clear cell carcinoma in a polyp on final pathology (frozen section saw only benign endometrial polyp).  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  Interval Hx:  She is seen today for a surveillance examiantion. She denies symptoms concerning for recurrence including no vaginal bleeding, no lower extremity edema, no abdominal pain or bloating. She does report constipation.   No Known Allergies Past Medical  History:  Diagnosis Date  . CAD (coronary artery disease)    2009 RCA occluded proximally, LAD occluded proximally, left main 90% stenosis, LIMA to the LAD widely patent, SVG to OM and diagonal patent, SVG RCA patent. This stenosis after the anastomosis of 80% in the native vessel. This was treated with PCI.  Marland Kitchen Collagen vascular disease ([place])   . HTN (hypertension)   . Hyperlipidemia    Past Surgical History:  Procedure Laterality Date  . ABDOMINAL AORTIC ANEURYSM REPAIR  2003  . ABDOMINAL HYSTERECTOMY N/A 03/26/2014   Procedure: TOTAL HYSTERECTOMY ABDOMINAL;  Surgeon: Everitt Amber, MD;  Location: [facility];  Service: Gynecology;  Laterality: N/A;  . COLON SURGERY  2007   blockage  . CORONARY ARTERY BYPASS GRAFT  1994   LIMA to the LAD, SVG to RCA, SVG sequential to OM and diagonal,  . LAPAROTOMY N/A 03/26/2014   Procedure: EXPLORATORY LAPAROTOMY;  Surgeon: Everitt Amber, MD;  [facility]: [facility];  Service: Gynecology;  Laterality: N/A;  . LYSIS OF ADHESION  03/26/2014   Procedure: LYSIS OF ADHESION;  Surgeon: Everitt Amber, MD;  Location: [facility];  Service: Gynecology;;  . OMENTECTOMY  03/26/2014   Procedure: OMENTECTOMY;  Surgeon: Everitt Amber, MD;  Location: [facility];  Service: Gynecology;;  . SALPINGOOPHORECTOMY Bilateral 03/26/2014   Procedure: BILATERL SALPINGO OOPHORECTOMY;  Surgeon: Everitt Amber, MD;  Location: [facility];  Service: Gynecology;  Laterality: Bilateral;  . TONSILLECTOMY    . VENTRAL HERNIA REPAIR  03/26/2014   Procedure: HERNIA REPAIR VENTRAL ADULT;  Surgeon: Excell Seltzer, MD;  Location: [facility];  Service: General;;  With MESH  Family History  Problem Relation Age of Onset  . CAD Father 32    Died of MI  . Hypertension Brother   . Cancer Sister     Ovarian   Social History   Social History  . Marital status: Married    Spouse name: N/A  . Number of children: 3  . Years of education: N/A   Occupational History  . Not on file.   Social History Main Topics  . Smoking  status: Light Tobacco Smoker    Packs/day: 0.25    Types: Cigarettes  . Smokeless tobacco: Never Used  . Alcohol use No  . Drug use: No  . Sexual activity: Not Currently   Other Topics Concern  . Not on file   Social History Narrative   Lives with husband and 22 year old.     Current Outpatient Prescriptions on File Prior to Visit  Medication Sig Dispense Refill  . Amlodipine-Valsartan-HCTZ 10-320-25 MG TABS Take 1 tablet by mouth every morning.   3  . aspirin 325 MG tablet Take 325 mg by mouth daily.    Marland Kitchen BIOTIN PO Take 3 tablets by mouth every morning.     . colesevelam (WELCHOL) 625 MG tablet Take 1,875 mg by mouth 2 (two) times daily.    Marland Kitchen estradiol (ESTRACE) 0.5 MG tablet Take 1 tablet (0.5 mg total) by mouth daily. 30 tablet 11  . ezetimibe (ZETIA) 10 MG tablet Take 10 mg by mouth every morning.     . Omega-3 Fatty Acids (FISH OIL PO) Take 3 capsules by mouth 2 (two) times daily.     . rosuvastatin (CRESTOR) 40 MG tablet Take 40 mg by mouth every morning.     Marland Kitchen ibuprofen (ADVIL,MOTRIN) 600 MG tablet Take 1 tablet (600 mg total) by mouth every 6 (six) hours as needed for mild pain. (Patient not taking: Reported on 11/26/2015) 30 tablet 0   No current facility-administered medications on file prior to visit.     Review of systems: Constitutional:  She has no weight gain or weight loss. She has no fever or chills. Eyes: No blurred vision Ears, Nose, Mouth, Throat: No dizziness, headaches or changes in hearing. No mouth sores. Cardiovascular: No chest pain, palpitations or edema. Respiratory:  No shortness of breath, wheezing or cough Gastrointestinal: She has normal bowel movements without diarrhea. She denies any nausea or vomiting. She denies blood in her stool or heart burn. + constipation. Genitourinary:  She denies pelvic pain, pelvic pressure or changes in her urinary function. She has no hematuria, dysuria, or incontinence. She has no irregular vaginal bleeding or  vaginal discharge Musculoskeletal: Denies muscle weakness or joint pains.  Skin:  She has no skin changes, rashes or itching Neurological:  Denies dizziness or headaches. No neuropathy, no numbness or tingling. Psychiatric:  She denies depression or anxiety. Hematologic/Lymphatic:   No easy bruising or bleeding   Physical Exam: Blood pressure 130/69, pulse 81, temperature 97.8 F (36.6 C), temperature source Oral, resp. rate 18, height 5\' 9"  (1.753 m), weight 188 lb 1.6 oz (85.3 kg), SpO2 99 %. General: Well dressed, well nourished in no apparent distress.   HEENT:  Normocephalic and atraumatic, no lesions.  Extraocular muscles intact. Sclerae anicteric. Pupils equal, round, reactive. No mouth sores or ulcers. Thyroid is normal size, not nodular, midline. Skin:  No lesions or rashes. Lymph nodes: no cervical or inguinal adenopathy Breasts:  deferred Lungs:  CTAB Cardiovascular: RRR Abdomen:  Soft, nontender, nondistended.  No  palpable masses.  No hepatosplenomegaly.  No ascites. Normal bowel sounds.  No hernias.  Incision is well healed Genitourinary: Normal EGBUS  Vaginal cuff intact.  No bleeding or discharge.  No cul de sac fullness. Palpable nodular masses deep to vagina. Unclear if stool vs recurrence Extremities: No cyanosis, clubbing or edema.  No calf tenderness or erythema. No palpable cords. Psychiatric: Mood and affect are appropriate. Neurological: Awake, alert and oriented x 3. Sensation is intact, no neuropathy.  Musculoskeletal: No pain, normal strength and range of motion.  Donaciano Eva, MD

## 2015-11-26 NOTE — Patient Instructions (Signed)
Plan to have a CT scan of the abdomen and pelvis to evaluate the nodularity felt on exam.  If normal, plan to follow up in six months or sooner if needed.

## 2015-11-27 ENCOUNTER — Telehealth: Payer: Self-pay

## 2015-11-27 NOTE — Telephone Encounter (Signed)
Orders received from [facility] to contact the patient with her Potassium Level being boarder low at 3.3 . Recommendations are to "increase potassium intake". Attempted to contact the patient , no answer, left a detailed message with call back requested to ensure she received the message and understood the recommendations , our contact information was provided .

## 2015-12-03 ENCOUNTER — Ambulatory Visit (HOSPITAL_COMMUNITY): Payer: Medicare Other

## 2015-12-12 ENCOUNTER — Ambulatory Visit (HOSPITAL_COMMUNITY): Admission: RE | Admit: 2015-12-12 | Payer: Medicare Other | Source: Ambulatory Visit

## 2016-03-31 ENCOUNTER — Telehealth: Payer: Self-pay | Admitting: *Deleted

## 2016-03-31 NOTE — Telephone Encounter (Signed)
I have faxed the prescription to [facility] for estradiol

## 2016-04-28 ENCOUNTER — Ambulatory Visit: Payer: Medicare Other | Admitting: Gynecologic Oncology

## 2016-05-03 ENCOUNTER — Ambulatory Visit: Payer: Medicare Other | Admitting: Gynecologic Oncology

## 2016-06-04 ENCOUNTER — Encounter: Payer: Self-pay | Admitting: Gynecologic Oncology

## 2016-06-04 ENCOUNTER — Ambulatory Visit: Payer: Medicare Other | Attending: Gynecologic Oncology | Admitting: Gynecologic Oncology

## 2016-06-04 VITALS — BP 146/82 | HR 78 | Temp 97.7°F | Resp 18 | Wt 179.8 lb

## 2016-06-04 DIAGNOSIS — N959 Unspecified menopausal and perimenopausal disorder: Secondary | ICD-10-CM | POA: Diagnosis not present

## 2016-06-04 DIAGNOSIS — C541 Malignant neoplasm of endometrium: Secondary | ICD-10-CM | POA: Insufficient documentation

## 2016-06-04 DIAGNOSIS — Z8542 Personal history of malignant neoplasm of other parts of uterus: Secondary | ICD-10-CM | POA: Diagnosis not present

## 2016-06-04 DIAGNOSIS — Z720 Tobacco use: Secondary | ICD-10-CM

## 2016-06-04 DIAGNOSIS — F17210 Nicotine dependence, cigarettes, uncomplicated: Secondary | ICD-10-CM | POA: Diagnosis not present

## 2016-06-04 HISTORY — DX: Tobacco use: Z72.0

## 2016-06-04 NOTE — Patient Instructions (Signed)
Please notify Dr Denman George at phone number [phone] if you notice vaginal bleeding, new pelvic or abdominal pains, bloating, feeling full easy, or a change in bladder or bowel function.   Please return to see Dr Denman George in November, 2018.

## 2016-06-04 NOTE — Progress Notes (Signed)
FOLLOWUP VISIT: ENDOMETRIAL CANCER  CHIEF COMPLAINT: Chief Complaint  Patient presents with  . Endometrial Cancer   Assessment:    71 y.o. 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 Endometrial Cancer   Assessment:    71 y.o. 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   S/p ex lap, TAH, BSO on 03/26/14. no LVSI, no myometrial invasion, negative pelvic washings and  lymph nodes not sampled. No adjuvant therapy administered.   Plan: 1) Hx of stage IA clear cell endometrial cancer - no evidence of recurrence. 2) vasomotor symptoms of menopause - stable at 0.5mg  premarin daily.  3)  Return to clinic in 6 months to see me.   HPI:  [name] is a 71 y.o. year old initially seen in consultation on 01/08/15 for bilateral adnexal masses.  She then underwent an exploratory laparotomy, BSO (with hernia repair with Dr Excell Seltzer) on 2/53/66 without complications.  Her postoperative course was complicated by delayed return of bowel function (10 day hospital stay including TPN administration). 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 Her endometrium had an incidental finding of clear cell carcinoma in a polyp on final pathology (frozen section saw only benign endometrial polyp).  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  Interval Hx:  She is seen today for a surveillance examiantion. She denies symptoms concerning for recurrence including no vaginal bleeding, no lower extremity edema, no abdominal pain or bloating. She does report constipation.   No  Known Allergies Past Medical History:  Diagnosis Date  . CAD (coronary artery disease)    2009 RCA occluded proximally, LAD occluded proximally, left main 90% stenosis, LIMA to the LAD widely patent, SVG to OM and diagonal patent, SVG RCA patent. This stenosis after the anastomosis of 80% in the native vessel. This was treated with PCI.  Marland Kitchen Collagen vascular disease ([place])   . HTN (hypertension)   . Hyperlipidemia    Past Surgical History:  Procedure Laterality Date  . ABDOMINAL AORTIC ANEURYSM REPAIR  2003  . ABDOMINAL HYSTERECTOMY N/A 03/26/2014   Procedure: TOTAL HYSTERECTOMY ABDOMINAL;  Surgeon: Everitt Amber, MD;  Location: [facility];  Service: Gynecology;  Laterality: N/A;  . COLON SURGERY  2007   blockage  . CORONARY ARTERY BYPASS GRAFT  1994   LIMA to the LAD, SVG to RCA, SVG sequential to OM and diagonal,  . LAPAROTOMY N/A 03/26/2014   Procedure: EXPLORATORY LAPAROTOMY;  Surgeon: Everitt Amber, MD;  Location: [facility];  Service: Gynecology;  Laterality: N/A;  . LYSIS OF ADHESION  03/26/2014   Procedure: LYSIS OF ADHESION;  Surgeon: Everitt Amber, MD;  Location: [facility];  Service: Gynecology;;  . OMENTECTOMY  03/26/2014   Procedure: OMENTECTOMY;  Surgeon: Everitt Amber, MD;  Location: [facility];  Service: Gynecology;;  . SALPINGOOPHORECTOMY Bilateral 03/26/2014   Procedure: BILATERL SALPINGO OOPHORECTOMY;  Surgeon: Everitt Amber, MD;  Location: [facility];  Service: Gynecology;  Laterality: Bilateral;  . TONSILLECTOMY    . VENTRAL HERNIA REPAIR  03/26/2014   Procedure: HERNIA REPAIR VENTRAL ADULT;  Surgeon: Excell Seltzer, MD;  Location:  [facility];  Service: General;;  With MESH   Family History  Problem Relation Age of Onset  . CAD Father 54       Died of MI  . Hypertension Brother   . Cancer Sister        Ovarian   Social History   Social History  . Marital status: Married    Spouse name: N/A  . Number of children: 3  . Years of education: N/A   Occupational History  . Not on file.   Social  History Main Topics  . Smoking status: Light Tobacco Smoker    Packs/day: 0.25    Types: Cigarettes  . Smokeless tobacco: Never Used  . Alcohol use No  . Drug use: No  . Sexual activity: Not Currently   Other Topics Concern  . Not on file   Social History Narrative   Lives with husband and 103 year old.     Current Outpatient Prescriptions on File Prior to Visit  Medication Sig Dispense Refill  . Amlodipine-Valsartan-HCTZ 10-320-25 MG TABS Take 1 tablet by mouth every morning.   3  . aspirin 325 MG tablet Take 325 mg by mouth daily.    Marland Kitchen BIOTIN PO Take 3 tablets by mouth every morning.     . colesevelam (WELCHOL) 625 MG tablet Take 1,875 mg by mouth 2 (two) times daily.    Marland Kitchen estradiol (ESTRACE) 0.5 MG tablet Take 1 tablet (0.5 mg total) by mouth daily. 30 tablet 11  . ezetimibe (ZETIA) 10 MG tablet Take 10 mg by mouth every morning.     Marland Kitchen ibuprofen (ADVIL,MOTRIN) 600 MG tablet Take 1 tablet (600 mg total) by mouth every 6 (six) hours as needed for mild pain. 30 tablet 0  . Omega-3 Fatty Acids (FISH OIL PO) Take 3 capsules by mouth 2 (two) times daily.     . rosuvastatin (CRESTOR) 40 MG tablet Take 40 mg by mouth every morning.     Marland Kitchen FLUoxetine (PROZAC) 20 MG tablet Take 20 mg by mouth.     No current facility-administered medications on file prior to visit.     Review of systems: Constitutional:  She has no weight gain or weight loss. She has no fever or chills. Eyes: No blurred vision Ears, Nose, Mouth, Throat: No dizziness, headaches or changes in hearing. No mouth sores. Cardiovascular: No chest pain, palpitations or edema. Respiratory:  No shortness of breath, wheezing or cough Gastrointestinal: She has normal bowel movements without diarrhea. She denies any nausea or vomiting. She denies blood in her stool or heart burn. + constipation. Genitourinary:  She denies pelvic pain, pelvic pressure or changes in her urinary function. She has no hematuria, dysuria, or incontinence.  She has no irregular vaginal bleeding or vaginal discharge Musculoskeletal: Denies muscle weakness or joint pains.  Skin:  She has no skin changes, rashes or itching Neurological:  Denies dizziness or headaches. No neuropathy, no numbness or tingling. Psychiatric:  She denies depression or anxiety. Hematologic/Lymphatic:   No easy bruising or bleeding   Physical Exam: Blood pressure (!) 146/82, pulse 78, temperature 97.7 F (36.5 C), temperature source Oral, resp. rate 18, weight 179 lb 12.8 oz (81.6 kg). General: Well dressed, well nourished in no apparent distress.   HEENT:  Normocephalic and atraumatic, no lesions.  Extraocular muscles intact. Sclerae anicteric. Pupils equal, round, reactive. No mouth sores or ulcers. Thyroid is normal size, not nodular, midline. Skin:  No lesions or rashes. Lymph nodes: no cervical or  inguinal adenopathy Breasts:  deferred Lungs:  CTAB Cardiovascular: RRR Abdomen:  Soft, nontender, nondistended.  No palpable masses.  No hepatosplenomegaly.  No ascites. Normal bowel sounds.  No hernias.  Incision is well healed Genitourinary: Normal EGBUS  Vaginal cuff intact.  No bleeding or discharge.  No cul de sac fullness. Palpable nodular masses deep to vagina. Unclear if stool vs recurrence Extremities: No cyanosis, clubbing or edema.  No calf tenderness or erythema. No palpable cords. Psychiatric: Mood and affect are appropriate. Neurological: Awake, alert and oriented x 3. Sensation is intact, no neuropathy.  Musculoskeletal: No pain, normal strength and range of motion.  Donaciano Eva, MD ometrial cancer (incidental finding).   S/p ex lap, TAH, BSO on 03/26/14. no LVSI, no myometrial invasion, negative pelvic washings and  lymph nodes not sampled. No adjuvant therapy administered.   Plan: 1) Hx of stage IA clear cell endometrial cancer - Palpable deep nodularity on pelvic exam. Recommend CT abdo/pelvis to better delineate. This could be retained stool  (patient has constipation). 2) vasomotor symptoms of menopause - stable at 0.5mg  premarin daily.  3)  Return to clinic in 6 months to see me. Then will see Dr Corinna Capra 6 months after that.  HPI:  [name] is a 71 y.o. year old initially seen in consultation on 01/08/15 for bilateral adnexal masses.  She then underwent an exploratory laparotomy, BSO (with hernia repair with Dr Excell Seltzer) on 1/85/63 without complications.  Her postoperative course was complicated by delayed return of bowel function (10 day hospital stay including TPN administration). 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 Her endometrium had an incidental finding of clear cell carcinoma in a polyp on final pathology (frozen section saw only benign endometrial polyp).  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  Interval Hx:  She is seen today for a surveillance examiantion. She denies symptoms concerning for recurrence including no vaginal bleeding, no lower extremity edema, no abdominal pain or bloating. She does report constipation.   No Known Allergies Past Medical History:  Diagnosis Date  . CAD (coronary artery disease)    2009 RCA occluded proximally, LAD occluded proximally, left main 90% stenosis, LIMA to the LAD widely patent, SVG to OM and diagonal patent, SVG RCA patent. This stenosis after the anastomosis of 80% in the native vessel. This was treated with PCI.  Marland Kitchen Collagen vascular disease ([place])   . HTN (hypertension)   . Hyperlipidemia    Past Surgical History:  Procedure Laterality Date  . ABDOMINAL AORTIC ANEURYSM REPAIR  2003  . ABDOMINAL HYSTERECTOMY N/A 03/26/2014   Procedure: TOTAL HYSTERECTOMY ABDOMINAL;  Surgeon: Everitt Amber,  MD;  Location: [facility];  Service: Gynecology;  Laterality: N/A;  . COLON SURGERY  2007   blockage  . CORONARY ARTERY BYPASS GRAFT  1994   LIMA to the LAD, SVG to RCA, SVG sequential to OM and diagonal,  . LAPAROTOMY N/A 03/26/2014   Procedure: EXPLORATORY LAPAROTOMY;  Surgeon: Everitt Amber, MD;  Location: [facility];  Service: Gynecology;  Laterality: N/A;  . LYSIS OF ADHESION  03/26/2014   Procedure: LYSIS OF ADHESION;  Surgeon: Everitt Amber, MD;  Location: [facility];  Service: Gynecology;;  . OMENTECTOMY  03/26/2014   Procedure: OMENTECTOMY;  Surgeon: Terrence Dupont  Denman George, MD;  Location: [facility];  Service: Gynecology;;  . SALPINGOOPHORECTOMY Bilateral 03/26/2014   Procedure: BILATERL SALPINGO OOPHORECTOMY;  Surgeon: Everitt Amber, MD;  Location: [facility];  Service: Gynecology;  Laterality: Bilateral;  . TONSILLECTOMY    . VENTRAL HERNIA REPAIR  03/26/2014   Procedure: HERNIA REPAIR VENTRAL ADULT;  Surgeon: Excell Seltzer, MD;  Location: [facility];  Service: General;;  With MESH   Family History  Problem Relation Age of Onset  . CAD Father 44       Died of MI  . Hypertension Brother   . Cancer Sister        Ovarian   Social History   Social History  . Marital status: Married    Spouse name: N/A  . Number of children: 3  . Years of education: N/A   Occupational History  . Not on file.   Social History Main Topics  . Smoking status: Light Tobacco Smoker    Packs/day: 0.25    Types: Cigarettes  . Smokeless tobacco: Never Used  . Alcohol use No  . Drug use: No  . Sexual activity: Not Currently   Other Topics Concern  . Not on file   Social History Narrative   Lives with husband and 60 year old.     Current Outpatient Prescriptions on File Prior to Visit  Medication Sig Dispense Refill  . Amlodipine-Valsartan-HCTZ 10-320-25 MG TABS Take 1 tablet by mouth every morning.   3  . aspirin 325 MG tablet Take 325 mg by mouth daily.    Marland Kitchen BIOTIN PO Take 3 tablets by mouth every morning.     . colesevelam  (WELCHOL) 625 MG tablet Take 1,875 mg by mouth 2 (two) times daily.    Marland Kitchen estradiol (ESTRACE) 0.5 MG tablet Take 1 tablet (0.5 mg total) by mouth daily. 30 tablet 11  . ezetimibe (ZETIA) 10 MG tablet Take 10 mg by mouth every morning.     Marland Kitchen ibuprofen (ADVIL,MOTRIN) 600 MG tablet Take 1 tablet (600 mg total) by mouth every 6 (six) hours as needed for mild pain. 30 tablet 0  . Omega-3 Fatty Acids (FISH OIL PO) Take 3 capsules by mouth 2 (two) times daily.     . rosuvastatin (CRESTOR) 40 MG tablet Take 40 mg by mouth every morning.     Marland Kitchen FLUoxetine (PROZAC) 20 MG tablet Take 20 mg by mouth.     No current facility-administered medications on file prior to visit.     Review of systems: Constitutional:  She has no weight gain or weight loss. She has no fever or chills. Eyes: No blurred vision Ears, Nose, Mouth, Throat: No dizziness, headaches or changes in hearing. No mouth sores. Cardiovascular: No chest pain, palpitations or edema. Respiratory:  No shortness of breath, wheezing or cough Gastrointestinal: She has normal bowel movements without diarrhea. She denies any nausea or vomiting. She denies blood in her stool or heart burn. + constipation. Genitourinary:  She denies pelvic pain, pelvic pressure or changes in her urinary function. She has no hematuria, dysuria, or incontinence. She has no irregular vaginal bleeding or vaginal discharge Musculoskeletal: Denies muscle weakness or joint pains.  Skin:  She has no skin changes, rashes or itching Neurological:  Denies dizziness or headaches. No neuropathy, no numbness or tingling. Psychiatric:  She denies depression or anxiety. Hematologic/Lymphatic:   No easy bruising or bleeding   Physical Exam: Blood pressure (!) 146/82, pulse 78, temperature 97.7 F (36.5 C), temperature source Oral, resp.  rate 18, weight 179 lb 12.8 oz (81.6 kg). General: Well dressed, well nourished in no apparent distress.   HEENT:  Normocephalic and atraumatic, no  lesions.  Extraocular muscles intact. Sclerae anicteric. Pupils equal, round, reactive. No mouth sores or ulcers. Thyroid is normal size, not nodular, midline. Skin:  No lesions or rashes. Lymph nodes: no cervical or inguinal adenopathy Breasts:  deferred Lungs:  CTAB Cardiovascular: RRR Abdomen:  Soft, nontender, nondistended.  No palpable masses.  No hepatosplenomegaly.  No ascites. Normal bowel sounds.  No hernias.  Incision is well healed Genitourinary: Normal EGBUS  Vaginal cuff intact.  No bleeding or discharge.  No cul de sac fullness. No palpable masses. Extremities: No cyanosis, clubbing or edema.  No calf tenderness or erythema. No palpable cords. Psychiatric: Mood and affect are appropriate. Neurological: Awake, alert and oriented x 3. Sensation is intact, no neuropathy.  Musculoskeletal: No pain, normal strength and range of motion.  Donaciano Eva, MD

## 2016-06-08 DIAGNOSIS — Z1329 Encounter for screening for other suspected endocrine disorder: Secondary | ICD-10-CM

## 2016-06-08 DIAGNOSIS — I25810 Atherosclerosis of coronary artery bypass graft(s) without angina pectoris: Secondary | ICD-10-CM | POA: Insufficient documentation

## 2016-06-08 HISTORY — DX: Encounter for screening for other suspected endocrine disorder: Z13.29

## 2016-06-08 HISTORY — DX: Hypercalcemia: E83.52

## 2016-06-08 HISTORY — DX: Atherosclerosis of coronary artery bypass graft(s) without angina pectoris: I25.810

## 2016-11-24 DIAGNOSIS — N951 Menopausal and female climacteric states: Secondary | ICD-10-CM

## 2016-11-24 HISTORY — DX: Menopausal and female climacteric states: N95.1

## 2016-12-06 ENCOUNTER — Encounter: Payer: Self-pay | Admitting: Gynecologic Oncology

## 2016-12-06 ENCOUNTER — Ambulatory Visit: Payer: Medicare Other | Attending: Gynecologic Oncology | Admitting: Gynecologic Oncology

## 2016-12-06 VITALS — BP 138/87 | HR 90 | Temp 98.1°F | Resp 20 | Ht 69.0 in | Wt 189.0 lb

## 2016-12-06 DIAGNOSIS — Z8542 Personal history of malignant neoplasm of other parts of uterus: Secondary | ICD-10-CM | POA: Diagnosis present

## 2016-12-06 DIAGNOSIS — E785 Hyperlipidemia, unspecified: Secondary | ICD-10-CM | POA: Diagnosis not present

## 2016-12-06 DIAGNOSIS — Z9889 Other specified postprocedural states: Secondary | ICD-10-CM | POA: Insufficient documentation

## 2016-12-06 DIAGNOSIS — K5900 Constipation, unspecified: Secondary | ICD-10-CM | POA: Insufficient documentation

## 2016-12-06 DIAGNOSIS — N951 Menopausal and female climacteric states: Secondary | ICD-10-CM

## 2016-12-06 DIAGNOSIS — Z8249 Family history of ischemic heart disease and other diseases of the circulatory system: Secondary | ICD-10-CM | POA: Diagnosis not present

## 2016-12-06 DIAGNOSIS — R232 Flushing: Secondary | ICD-10-CM | POA: Diagnosis not present

## 2016-12-06 DIAGNOSIS — Z791 Long term (current) use of non-steroidal anti-inflammatories (NSAID): Secondary | ICD-10-CM | POA: Diagnosis not present

## 2016-12-06 DIAGNOSIS — Z8679 Personal history of other diseases of the circulatory system: Secondary | ICD-10-CM | POA: Diagnosis not present

## 2016-12-06 DIAGNOSIS — Z90710 Acquired absence of both cervix and uterus: Secondary | ICD-10-CM

## 2016-12-06 DIAGNOSIS — Z7982 Long term (current) use of aspirin: Secondary | ICD-10-CM | POA: Insufficient documentation

## 2016-12-06 DIAGNOSIS — I2510 Atherosclerotic heart disease of native coronary artery without angina pectoris: Secondary | ICD-10-CM | POA: Diagnosis not present

## 2016-12-06 DIAGNOSIS — Z79899 Other long term (current) drug therapy: Secondary | ICD-10-CM | POA: Insufficient documentation

## 2016-12-06 DIAGNOSIS — Z951 Presence of aortocoronary bypass graft: Secondary | ICD-10-CM | POA: Diagnosis not present

## 2016-12-06 DIAGNOSIS — C541 Malignant neoplasm of endometrium: Secondary | ICD-10-CM

## 2016-12-06 DIAGNOSIS — Z8041 Family history of malignant neoplasm of ovary: Secondary | ICD-10-CM | POA: Diagnosis not present

## 2016-12-06 DIAGNOSIS — F17210 Nicotine dependence, cigarettes, uncomplicated: Secondary | ICD-10-CM | POA: Insufficient documentation

## 2016-12-06 DIAGNOSIS — I10 Essential (primary) hypertension: Secondary | ICD-10-CM | POA: Insufficient documentation

## 2016-12-06 MED ORDER — ESTRADIOL 0.5 MG PO TABS: 0.5000 mg | ORAL_TABLET | Freq: Every day | ORAL | 11 refills | Status: DC

## 2016-12-06 NOTE — Progress Notes (Signed)
FOLLOWUP VISIT: ENDOMETRIAL CANCER  CHIEF COMPLAINT: Chief Complaint  Patient presents with  . Endometrial cancer [facility])   Assessment:    71 y.o. 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 Endometrial cancer [facility])   Assessment:    71 y.o. 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   S/p ex lap, TAH, BSO on 03/26/14. no LVSI, no myometrial invasion, negative pelvic washings and  lymph nodes not sampled. No adjuvant therapy administered.   Plan: 1) Hx of stage IA clear cell endometrial cancer - no evidence of recurrence. 2) vasomotor symptoms of menopause - stable at 0.5mg  premarin daily. Renewed prescription. 3)  Return to clinic in 6 months to see me.   HPI:  PRAPTI GRUSSING is a 71 y.o. year old initially seen in consultation on 01/08/15 for bilateral adnexal masses.  She then underwent an exploratory laparotomy, BSO (with hernia repair with Dr Excell Seltzer) on 2/42/35 without complications.  Her postoperative course was complicated by delayed return of bowel function (10 day hospital stay including TPN administration). 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 Her endometrium had an incidental finding of clear cell carcinoma in a polyp on final pathology (frozen section saw only benign endometrial polyp).  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  Interval Hx:  She is seen today for a surveillance examiantion. She denies symptoms concerning for recurrence including no vaginal bleeding, no lower extremity edema, no abdominal pain or bloating. She  does report constipation.   No Known Allergies Past Medical History:  Diagnosis Date  . CAD (coronary artery disease)    2009 RCA occluded proximally, LAD occluded proximally, left main 90% stenosis, LIMA to the LAD widely patent, SVG to OM and diagonal patent, SVG RCA patent. This stenosis after the anastomosis of 80% in the native vessel. This was treated with PCI.  Marland Kitchen Collagen vascular disease ([place])   . HTN (hypertension)   . Hyperlipidemia    Past Surgical History:  Procedure Laterality Date  . ABDOMINAL AORTIC ANEURYSM REPAIR  2003  . ABDOMINAL HYSTERECTOMY N/A 03/26/2014   Procedure: TOTAL HYSTERECTOMY ABDOMINAL;  Surgeon: Everitt Amber, MD;  [facility];  Service: Gynecology;  Laterality: N/A;  . COLON SURGERY  2007   blockage  . CORONARY ARTERY BYPASS GRAFT  1994   LIMA to the LAD, SVG to RCA, SVG sequential to OM and diagonal,  . LAPAROTOMY N/A 03/26/2014   Procedure: EXPLORATORY LAPAROTOMY;  Surgeon: Everitt Amber, MD;  [facility];  Service: Gynecology;  Laterality: N/A;  . LYSIS OF ADHESION  03/26/2014   Procedure: LYSIS OF ADHESION;  Surgeon: Everitt Amber, MD;  [facility];  Service: Gynecology;;  . OMENTECTOMY  03/26/2014   Procedure: OMENTECTOMY;  Surgeon: Everitt Amber, MD;  [facility];  Service: Gynecology;;  . SALPINGOOPHORECTOMY Bilateral 03/26/2014   Procedure: BILATERL SALPINGO OOPHORECTOMY;  Surgeon: Everitt Amber, MD;  [facility];  Service: Gynecology;  Laterality: Bilateral;  . TONSILLECTOMY    . VENTRAL HERNIA REPAIR  03/26/2014   Procedure: HERNIA REPAIR VENTRAL ADULT;  Surgeon: Excell Seltzer,  MD;  [facility];  Service: General;;  With MESH   Family History  Problem Relation Age of Onset  . CAD Father 17       Died of MI  . Hypertension Brother   . Cancer Sister        Ovarian   Social History   Socioeconomic History  . Marital status: Married    Spouse name: Not on file  . Number of children: 3  . Years of education: Not on file   . Highest education level: Not on file  Social Needs  . Financial resource strain: Not on file  . Food insecurity - worry: Not on file  . Food insecurity - inability: Not on file  . Transportation needs - medical: Not on file  . Transportation needs - non-medical: Not on file  Occupational History  . Not on file  Tobacco Use  . Smoking status: Light Tobacco Smoker    Packs/day: 0.25    Types: Cigarettes  . Smokeless tobacco: Never Used  Substance and Sexual Activity  . Alcohol use: No    Alcohol/week: 0.0 oz  . Drug use: No  . Sexual activity: Not Currently  Other Topics Concern  . Not on file  Social History Narrative   Lives with husband and 7 year old.     Current Outpatient Medications on File Prior to Visit  Medication Sig Dispense Refill  . Amlodipine-Valsartan-HCTZ 10-320-25 MG TABS Take 1 tablet by mouth every morning.   3  . aspirin 325 MG tablet Take 325 mg by mouth daily.    Marland Kitchen BIOTIN PO Take 3 tablets by mouth every morning.     . colesevelam (WELCHOL) 625 MG tablet Take 1,875 mg by mouth 2 (two) times daily.    Marland Kitchen ezetimibe (ZETIA) 10 MG tablet Take 10 mg by mouth every morning.     Marland Kitchen ibuprofen (ADVIL,MOTRIN) 600 MG tablet Take 1 tablet (600 mg total) by mouth every 6 (six) hours as needed for mild pain. 30 tablet 0  . Omega-3 Fatty Acids (FISH OIL PO) Take 3 capsules by mouth 2 (two) times daily.     . rosuvastatin (CRESTOR) 40 MG tablet Take 40 mg by mouth every morning.     Marland Kitchen FLUoxetine (PROZAC) 20 MG tablet Take 20 mg by mouth.     No current facility-administered medications on file prior to visit.     Review of systems: Constitutional:  She has no weight gain or weight loss. She has no fever or chills. Eyes: No blurred vision Ears, Nose, Mouth, Throat: No dizziness, headaches or changes in hearing. No mouth sores. Cardiovascular: No chest pain, palpitations or edema. Respiratory:  No shortness of breath, wheezing or cough Gastrointestinal: She has  normal bowel movements without diarrhea. She denies any nausea or vomiting. She denies blood in her stool or heart burn. + constipation. Genitourinary:  She denies pelvic pain, pelvic pressure or changes in her urinary function. She has no hematuria, dysuria, or incontinence. She has no irregular vaginal bleeding or vaginal discharge Musculoskeletal: Denies muscle weakness or joint pains.  Skin:  She has no skin changes, rashes or itching Neurological:  Denies dizziness or headaches. No neuropathy, no numbness or tingling. Psychiatric:  She denies depression or anxiety. Hematologic/Lymphatic:   No easy bruising or bleeding   Physical Exam: Blood pressure 138/87, pulse 90, temperature 98.1 F (36.7 C), temperature source Oral, resp. rate 20, height 5\' 9"  (1.753 m), weight 189 lb (85.7 kg), SpO2  97 %. General: Well dressed, well nourished in no apparent distress.   HEENT:  Normocephalic and atraumatic, no lesions.  Extraocular muscles intact. Sclerae anicteric. Pupils equal, round, reactive. No mouth sores or ulcers. Thyroid is normal size, not nodular, midline. Skin:  No lesions or rashes. Lymph nodes: no cervical or inguinal adenopathy Breasts:  deferred Lungs:  CTAB Cardiovascular: RRR Abdomen:  Soft, nontender, nondistended.  No palpable masses.  No hepatosplenomegaly.  No ascites. Normal bowel sounds.  No hernias.  Incision is well healed Genitourinary: Normal EGBUS  Vaginal cuff intact.  No bleeding or discharge.  No cul de sac fullness. Palpable nodular masses deep to vagina. Unclear if stool vs recurrence Extremities: No cyanosis, clubbing or edema.  No calf tenderness or erythema. No palpable cords. Psychiatric: Mood and affect are appropriate. Neurological: Awake, alert and oriented x 3. Sensation is intact, no neuropathy.  Musculoskeletal: No pain, normal strength and range of motion.  Donaciano Eva, MD ometrial cancer (incidental finding).   S/p ex lap, TAH, BSO on  03/26/14. no LVSI, no myometrial invasion, negative pelvic washings and  lymph nodes not sampled. No adjuvant therapy administered.   Plan: 1) Hx of stage IA clear cell endometrial cancer - Palpable deep nodularity on pelvic exam. Recommend CT abdo/pelvis to better delineate. This could be retained stool (patient has constipation). 2) vasomotor symptoms of menopause - stable at 0.5mg  premarin daily.  3)  Return to clinic in 6 months to see me. Then will see Dr Corinna Capra 6 months after that.  HPI:  ORENE ABBASI is a 71 y.o. year old initially seen in consultation on 01/08/15 for bilateral adnexal masses.  She then underwent an exploratory laparotomy, BSO (with hernia repair with Dr Excell Seltzer) on 0/34/74 without complications.  Her postoperative course was complicated by delayed return of bowel function (10 day hospital stay including TPN administration). 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 Her endometrium had an incidental finding of clear cell carcinoma in a polyp on final pathology (frozen section saw only benign endometrial polyp).  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  Interval Hx:  She is seen today for a surveillance examiantion. She denies symptoms concerning for recurrence including no vaginal bleeding, no lower extremity edema, no abdominal pain or bloating. She does report constipation.   No Known Allergies Past Medical History:  Diagnosis Date  . CAD (coronary artery disease)    2009 RCA occluded proximally, LAD occluded proximally, left main 90% stenosis, LIMA to the LAD widely patent, SVG to OM and diagonal patent, SVG RCA patent. This stenosis after the anastomosis of 80% in the native vessel. This  was treated with PCI.  Marland Kitchen Collagen vascular disease ([place])   . HTN (hypertension)   . Hyperlipidemia    Past Surgical History:  Procedure Laterality Date  . ABDOMINAL AORTIC ANEURYSM REPAIR  2003  . ABDOMINAL HYSTERECTOMY N/A 03/26/2014   Procedure: TOTAL HYSTERECTOMY ABDOMINAL;  Surgeon: Everitt Amber, MD;  [facility];  Service: Gynecology;  Laterality: N/A;  . COLON SURGERY  2007   blockage  . CORONARY ARTERY BYPASS GRAFT  1994   LIMA to the LAD, SVG to RCA, SVG sequential to OM and diagonal,  . LAPAROTOMY N/A 03/26/2014   Procedure: EXPLORATORY  LAPAROTOMY;  Surgeon: Everitt Amber, MD;  [facility];  Service: Gynecology;  Laterality: N/A;  . LYSIS OF ADHESION  03/26/2014   Procedure: LYSIS OF ADHESION;  Surgeon: Everitt Amber, MD;  [facility];  Service: Gynecology;;  . OMENTECTOMY  03/26/2014   Procedure: OMENTECTOMY;  Surgeon: Everitt Amber, MD;  [facility];  Service: Gynecology;;  . SALPINGOOPHORECTOMY Bilateral 03/26/2014   Procedure: BILATERL SALPINGO OOPHORECTOMY;  Surgeon: Everitt Amber, MD;  [facility];  Service: Gynecology;  Laterality: Bilateral;  . TONSILLECTOMY    . VENTRAL HERNIA REPAIR  03/26/2014   Procedure: HERNIA REPAIR VENTRAL ADULT;  Surgeon: Excell Seltzer, MD;  [facility];  Service: General;;  With MESH   Family History  Problem Relation Age of Onset  . CAD Father 59       Died of MI  . Hypertension Brother   . Cancer Sister        Ovarian   Social History   Socioeconomic History  . Marital status: Married    Spouse name: Not on file  . Number of children: 3  . Years of education: Not on file  . Highest education level: Not on file  Social Needs  . Financial resource strain: Not on file  . Food insecurity - worry: Not on file  . Food insecurity - inability: Not on file  . Transportation needs - medical: Not on file  . Transportation needs - non-medical: Not on file  Occupational History  . Not on file  Tobacco Use  . Smoking  status: Light Tobacco Smoker    Packs/day: 0.25    Types: Cigarettes  . Smokeless tobacco: Never Used  Substance and Sexual Activity  . Alcohol use: No    Alcohol/week: 0.0 oz  . Drug use: No  . Sexual activity: Not Currently  Other Topics Concern  . Not on file  Social History Narrative   Lives with husband and 30 year old.     Current Outpatient Medications on File Prior to Visit  Medication Sig Dispense Refill  . Amlodipine-Valsartan-HCTZ 10-320-25 MG TABS Take 1 tablet by mouth every morning.   3  . aspirin 325 MG tablet Take 325 mg by mouth daily.    Marland Kitchen BIOTIN PO Take 3 tablets by mouth every morning.     . colesevelam (WELCHOL) 625 MG tablet Take 1,875 mg by mouth 2 (two) times daily.    Marland Kitchen ezetimibe (ZETIA) 10 MG tablet Take 10 mg by mouth every morning.     Marland Kitchen ibuprofen (ADVIL,MOTRIN) 600 MG tablet Take 1 tablet (600 mg total) by mouth every 6 (six) hours as needed for mild pain. 30 tablet 0  . Omega-3 Fatty Acids (FISH OIL PO) Take 3 capsules by mouth 2 (two) times daily.     . rosuvastatin (CRESTOR) 40 MG tablet Take 40 mg by mouth every morning.     Marland Kitchen FLUoxetine (PROZAC) 20 MG tablet Take 20 mg by mouth.     No current facility-administered medications on file prior to visit.     Review of systems: Constitutional:  She has no weight gain or weight loss. She has no fever or chills. Eyes: No blurred vision Ears, Nose, Mouth, Throat: No dizziness, headaches or changes in hearing. No mouth sores. Cardiovascular: No chest pain, palpitations or edema. Respiratory:  No shortness of breath, wheezing or cough Gastrointestinal: She has normal bowel movements without diarrhea. She denies any nausea or vomiting. She denies blood in her stool or  heart burn. + constipation. Genitourinary:  She denies pelvic pain, pelvic pressure or changes in her urinary function. She has no hematuria, dysuria, or incontinence. She has no irregular vaginal bleeding or vaginal  discharge Musculoskeletal: Denies muscle weakness or joint pains.  Skin:  She has no skin changes, rashes or itching Neurological:  Denies dizziness or headaches. No neuropathy, no numbness or tingling. Psychiatric:  She denies depression or anxiety. Hematologic/Lymphatic:   No easy bruising or bleeding   Physical Exam: Blood pressure 138/87, pulse 90, temperature 98.1 F (36.7 C), temperature source Oral, resp. rate 20, height 5\' 9"  (1.753 m), weight 189 lb (85.7 kg), SpO2 97 %. General: Well dressed, well nourished in no apparent distress.   HEENT:  Normocephalic and atraumatic, no lesions.  Extraocular muscles intact. Sclerae anicteric. Pupils equal, round, reactive. No mouth sores or ulcers. Thyroid is normal size, not nodular, midline. Skin:  No lesions or rashes. Lymph nodes: no cervical or inguinal adenopathy Breasts:  deferred Lungs:  CTAB Cardiovascular: RRR Abdomen:  Soft, nontender, nondistended.  No palpable masses.  No hepatosplenomegaly.  No ascites. Normal bowel sounds.  No hernias.  Incision is well healed Genitourinary: Normal EGBUS  Vaginal cuff intact.  No bleeding or discharge.  No cul de sac fullness. No palpable masses. Extremities: No cyanosis, clubbing or edema.  No calf tenderness or erythema. No palpable cords. Psychiatric: Mood and affect are appropriate. Neurological: Awake, alert and oriented x 3. Sensation is intact, no neuropathy.  Musculoskeletal: No pain, normal strength and range of motion.  Donaciano Eva, MD

## 2016-12-06 NOTE — Patient Instructions (Signed)
Plan to follow up in May or sooner if needed.  Please call for any questions or concerns.

## 2016-12-15 ENCOUNTER — Other Ambulatory Visit: Payer: Self-pay | Admitting: Family Medicine

## 2017-01-31 ENCOUNTER — Ambulatory Visit: Payer: Medicare Other | Admitting: Family Medicine

## 2017-02-17 ENCOUNTER — Other Ambulatory Visit: Payer: Self-pay | Admitting: Family Medicine

## 2017-03-31 ENCOUNTER — Other Ambulatory Visit: Payer: Self-pay | Admitting: Family Medicine

## 2017-04-02 ENCOUNTER — Other Ambulatory Visit: Payer: Self-pay | Admitting: Family Medicine

## 2017-04-05 ENCOUNTER — Other Ambulatory Visit: Payer: Self-pay | Admitting: Family Medicine

## 2017-05-16 ENCOUNTER — Telehealth: Payer: Self-pay | Admitting: *Deleted

## 2017-05-16 NOTE — Telephone Encounter (Signed)
Patient called back and we moved the appt from May 15th to May 10th

## 2017-05-16 NOTE — Telephone Encounter (Signed)
Called and left the patient a message to call the office back. Need to move her appt from May 15th to May 8th or May 10th

## 2017-05-20 ENCOUNTER — Inpatient Hospital Stay: Payer: Medicare Other | Attending: Gynecologic Oncology | Admitting: Gynecologic Oncology

## 2017-05-20 ENCOUNTER — Encounter: Payer: Self-pay | Admitting: Gynecologic Oncology

## 2017-05-20 VITALS — BP 119/74 | HR 82 | Temp 98.0°F | Resp 20 | Ht 69.0 in | Wt 189.4 lb

## 2017-05-20 DIAGNOSIS — Z90722 Acquired absence of ovaries, bilateral: Secondary | ICD-10-CM | POA: Diagnosis not present

## 2017-05-20 DIAGNOSIS — Z90710 Acquired absence of both cervix and uterus: Secondary | ICD-10-CM | POA: Insufficient documentation

## 2017-05-20 DIAGNOSIS — F17210 Nicotine dependence, cigarettes, uncomplicated: Secondary | ICD-10-CM | POA: Insufficient documentation

## 2017-05-20 DIAGNOSIS — Z8542 Personal history of malignant neoplasm of other parts of uterus: Secondary | ICD-10-CM | POA: Diagnosis not present

## 2017-05-20 DIAGNOSIS — Z08 Encounter for follow-up examination after completed treatment for malignant neoplasm: Secondary | ICD-10-CM | POA: Diagnosis not present

## 2017-05-20 DIAGNOSIS — C541 Malignant neoplasm of endometrium: Secondary | ICD-10-CM

## 2017-05-20 NOTE — Progress Notes (Signed)
FOLLOWUP VISIT: ENDOMETRIAL CANCER  CHIEF COMPLAINT: Chief Complaint  Patient presents with  . Endometrial cancer [facility])   Assessment:    72 y.o. 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 Endometrial cancer [facility])   Assessment:    72 y.o. 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   S/p ex lap, TAH, BSO on 03/26/14. no LVSI, no myometrial invasion, negative pelvic washings and  lymph nodes not sampled. No adjuvant therapy administered.   Plan: 1) Hx of stage IA clear cell endometrial cancer - no evidence of recurrence. 2) vasomotor symptoms of menopause - stable at 0.5mg  premarin daily. Renewed prescription. 3)  Return to clinic in 6 months to see me.   HPI:  [name] is a 72 y.o. year old initially seen in consultation on 01/08/15 for bilateral adnexal masses.  She then underwent an exploratory laparotomy, BSO (with hernia repair with Dr Excell Seltzer) on 06/08/39 without complications.  Her postoperative course was complicated by delayed return of bowel function (10 day hospital stay including TPN administration). 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 Her endometrium had an incidental finding of clear cell carcinoma in a polyp on final pathology (frozen section saw only benign endometrial polyp).  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  Interval Hx:  She is seen today for a surveillance examiantion. She denies symptoms concerning for recurrence including no vaginal bleeding, no lower extremity edema, no abdominal pain or bloating.  No  Known Allergies Past Medical History:  Diagnosis Date  . CAD (coronary artery disease)    2009 RCA occluded proximally, LAD occluded proximally, left main 90% stenosis, LIMA to the LAD widely patent, SVG to OM and diagonal patent, SVG RCA patent. This stenosis after the anastomosis of 80% in the native vessel. This was treated with PCI.  Marland Kitchen Collagen vascular disease ([place])   . HTN (hypertension)   . Hyperlipidemia    Past Surgical History:  Procedure Laterality Date  . ABDOMINAL AORTIC ANEURYSM REPAIR  2003  . ABDOMINAL HYSTERECTOMY N/A 03/26/2014   Procedure: TOTAL HYSTERECTOMY ABDOMINAL;  Surgeon: Everitt Amber, MD;  [facility];  Service: Gynecology;  Laterality: N/A;  . COLON SURGERY  2007   blockage  . CORONARY ARTERY BYPASS GRAFT  1994   LIMA to the LAD, SVG to RCA, SVG sequential to OM and diagonal,  . LAPAROTOMY N/A 03/26/2014   Procedure: EXPLORATORY LAPAROTOMY;  Surgeon: Everitt Amber, MD;  [facility];  Service: Gynecology;  Laterality: N/A;  . LYSIS OF ADHESION  03/26/2014   Procedure: LYSIS OF ADHESION;  Surgeon: Everitt Amber, MD;  [facility];  Service: Gynecology;;  . OMENTECTOMY  03/26/2014   Procedure: OMENTECTOMY;  Surgeon: Everitt Amber, MD;  [facility];  Service: Gynecology;;  . SALPINGOOPHORECTOMY Bilateral 03/26/2014   Procedure: BILATERL SALPINGO OOPHORECTOMY;  Surgeon: Everitt Amber, MD;  [facility];  Service: Gynecology;  Laterality: Bilateral;  . TONSILLECTOMY    . VENTRAL HERNIA REPAIR  03/26/2014   Procedure: HERNIA REPAIR VENTRAL ADULT;  Surgeon: Excell Seltzer, MD;  [facility];  Service: General;;  With MESH   Family History  Problem Relation Age of Onset  . CAD Father 30       Died of MI  . Hypertension Brother   . Cancer Sister        Ovarian   Social History   Socioeconomic History  . Marital status: Married    Spouse name: Not on file  . Number of children: 3  . Years of education: Not on file  . Highest education level: Not  on file  Occupational History  . Not on file  Social Needs  . Financial resource strain: Not on file  . Food insecurity:    Worry: Not on file    Inability: Not on file  . Transportation needs:    Medical: Not on file    Non-medical: Not on file  Tobacco Use  . Smoking status: Light Tobacco Smoker    Packs/day: 0.25    Types: Cigarettes  . Smokeless tobacco: Never Used  Substance and Sexual Activity  . Alcohol use: No    Alcohol/week: 0.0 oz  . Drug use: No  . Sexual activity: Not Currently  Lifestyle  . Physical activity:    Days per week: Not on file    Minutes per session: Not on file  . Stress: Not on file  Relationships  . Social connections:    Talks on phone: Not on file    Gets together: Not on file    Attends religious service: Not on file    Active member of club or organization: Not on file    Attends meetings of clubs or organizations: Not on file    Relationship status: Not on file  . Intimate partner violence:    Fear of current or ex partner: Not on file    Emotionally abused: Not on file    Physically abused: Not on file    Forced sexual activity: Not on file  Other Topics Concern  . Not on file  Social History Narrative   Lives with husband and 41 year old.     Current Outpatient Medications on File Prior to Visit  Medication Sig Dispense Refill  . Amlodipine-Valsartan-HCTZ 10-320-25 MG TABS Take 1 tablet by mouth every morning.   3  . aspirin 325 MG tablet Take 325 mg by mouth daily.    Marland Kitchen BIOTIN PO Take 3 tablets by mouth every morning.     Marland Kitchen estradiol (ESTRACE) 0.5 MG tablet Take 1 tablet (0.5 mg total) by mouth daily. 30 tablet 11  . ezetimibe (ZETIA) 10 MG tablet Take 10 mg by mouth every morning.     Marland Kitchen FLUoxetine (PROZAC) 20 MG tablet TAKE 1 TABLET BY MOUTH EVERY DAY 30 tablet 0  . ibuprofen (ADVIL,MOTRIN) 600 MG tablet Take 1 tablet (600 mg total) by mouth every 6 (six) hours as needed for mild pain. 30 tablet 0  . Omega-3 Fatty Acids (FISH  OIL PO) Take 3 capsules by mouth 2 (two) times daily.     . rosuvastatin (CRESTOR) 40 MG tablet Take 40 mg by mouth every morning.      No current facility-administered medications on file prior to visit.     Review of systems: Constitutional:  She has no weight gain or weight loss. She has no fever or chills. Eyes: No blurred vision Ears, Nose, Mouth, Throat: No dizziness, headaches or changes in hearing. No mouth sores. Cardiovascular: No chest pain, palpitations or edema. Respiratory:  No shortness of breath,  wheezing or cough Gastrointestinal: She has normal bowel movements without diarrhea. She denies any nausea or vomiting. She denies blood in her stool or heart burn. no constipation. Genitourinary:  She denies pelvic pain, pelvic pressure or changes in her urinary function. She has no hematuria, dysuria, or incontinence. She has no irregular vaginal bleeding or vaginal discharge Musculoskeletal: Denies muscle weakness or joint pains.  Skin:  She has no skin changes, rashes or itching Neurological:  Denies dizziness or headaches. No neuropathy, no numbness or tingling. Psychiatric:  She denies depression or anxiety. Hematologic/Lymphatic:   No easy bruising or bleeding   Physical Exam: Blood pressure 119/74, pulse 82, temperature 98 F (36.7 C), temperature source Oral, resp. rate 20, height 5\' 9"  (1.753 m), weight 189 lb 6.4 oz (85.9 kg), SpO2 98 %. General: Well dressed, well nourished in no apparent distress.   HEENT:  Normocephalic and atraumatic, no lesions.  Extraocular muscles intact. Sclerae anicteric. Pupils equal, round, reactive. No mouth sores or ulcers. Thyroid is normal size, not nodular, midline. Skin:  No lesions or rashes. Lymph nodes: no cervical or inguinal adenopathy Breasts:  deferred Lungs:  CTAB Cardiovascular: RRR Abdomen:  Soft, nontender, nondistended.  No palpable masses.  No hepatosplenomegaly.  No ascites. Normal bowel sounds.  No hernias.  Incision  is well healed Genitourinary: Normal EGBUS  Vaginal cuff intact.  No bleeding or discharge.  No cul de sac fullness. No masses. No lesions. Extremities: No cyanosis, clubbing or edema.  No calf tenderness or erythema. No palpable cords. Psychiatric: Mood and affect are appropriate. Neurological: Awake, alert and oriented x 3. Sensation is intact, no neuropathy.  Musculoskeletal: No pain, normal strength and range of motion.  Thereasa Solo, MD

## 2017-05-20 NOTE — Patient Instructions (Signed)
Please notify Dr Denman George at phone number [phone] if you notice vaginal bleeding, new pelvic or abdominal pains, bloating, feeling full easy, or a change in bladder or bowel function.   Please return to see Dr Denman George in 6 months as scheduled.

## 2017-05-25 ENCOUNTER — Ambulatory Visit: Payer: Medicare Other | Admitting: Gynecologic Oncology

## 2017-11-16 ENCOUNTER — Inpatient Hospital Stay: Payer: Medicare Other | Attending: Gynecologic Oncology | Admitting: Gynecologic Oncology

## 2018-03-23 ENCOUNTER — Telehealth: Payer: Self-pay | Admitting: *Deleted

## 2018-03-23 NOTE — Telephone Encounter (Signed)
Patient called and scheduled a follow up appt next week.

## 2018-03-28 ENCOUNTER — Telehealth: Payer: Self-pay | Admitting: *Deleted

## 2018-03-28 NOTE — Telephone Encounter (Signed)
Patient called back and moved her appt to 4/27

## 2018-03-28 NOTE — Telephone Encounter (Signed)
Called and left the patient a message to call the office back. Need to move her appt from Friday to the end of April.

## 2018-03-31 ENCOUNTER — Inpatient Hospital Stay: Payer: Medicare Other | Admitting: Gynecologic Oncology

## 2018-04-06 ENCOUNTER — Ambulatory Visit: Payer: Medicare Other | Admitting: Cardiology

## 2018-04-28 ENCOUNTER — Telehealth: Payer: Self-pay | Admitting: *Deleted

## 2018-04-28 NOTE — Telephone Encounter (Signed)
Called and left the patient a message to call the office back. Need to move her appt from 4/27 to 4/29 and change to [employer]

## 2018-05-05 ENCOUNTER — Telehealth: Payer: Self-pay | Admitting: Cardiology

## 2018-05-05 NOTE — Telephone Encounter (Signed)
Mychart, smartphone, pre reg complete 05/05/18 AF °

## 2018-05-06 NOTE — Progress Notes (Signed)
Virtual Visit via Video Note   This visit type was conducted due to national recommendations for restrictions regarding the COVID-19 Pandemic (e.g. social distancing) in an effort to limit this patient's exposure and mitigate transmission in our community.  Due to her co-morbid illnesses, this patient is at least at moderate risk for complications without adequate follow up.  This format is felt to be most appropriate for this patient at this time.  All issues noted in this document were discussed and addressed.  A limited physical exam was performed with this format.  Please refer to the patient's chart for her consent to telehealth for [facility].   Evaluation Performed:  Follow-up visit  Date:  05/08/2018   ID:  [name], [name] 06/24/1945, MRN [id number]  Patient Location: Home Provider Location: Home  PCP:  Libby Maw, MD  Cardiologist:  Minus Breeding, MD  Electrophysiologist:  None   Chief Complaint:  CAD  History of Present Illness:    [name] is a 73 y.o. female who presents for follow up of CAD with  bypass surgery. She was previously followed by Dr. Rex Kras.  I last saw her in 2015 for evaluation prior to abdominal hernia surgery.     Her last stress perfusion study was in 2012.  Her last catheterization was in 2009.  Since we last saw her she is done very well.  She takes the stairs.  She pushes a vacuum.  She denies any chest discomfort, neck or arm discomfort.  She has no shortness of breath, PND or orthopnea.  She has no palpitations, presyncope ectopy.  She is had no weight gain or edema.  She has had none of the burning shoulder discomfort that was her previous chest pain.  The patient does not have symptoms concerning for COVID-19 infection (fever, chills, cough, or new shortness of breath).    Past Medical History:  Diagnosis Date   CAD (coronary artery disease)    2009 RCA occluded proximally, LAD occluded proximally, left main 90% stenosis,  LIMA to the LAD widely patent, SVG to OM and diagonal patent, SVG RCA patent. This stenosis after the anastomosis of 80% in the native vessel. This was treated with PCI.   Collagen vascular disease ([place])    HTN (hypertension)    Hyperlipidemia    Past Surgical History:  Procedure Laterality Date   ABDOMINAL AORTIC ANEURYSM REPAIR  2003   ABDOMINAL HYSTERECTOMY N/A 03/26/2014   Procedure: TOTAL HYSTERECTOMY ABDOMINAL;  Surgeon: Everitt Amber, MD;  Location: [facility];  Service: Gynecology;  Laterality: N/A;   COLON SURGERY  2007   blockage   CORONARY ARTERY BYPASS GRAFT  1994   LIMA to the LAD, SVG to RCA, SVG sequential to OM and diagonal,   LAPAROTOMY N/A 03/26/2014   Procedure: EXPLORATORY LAPAROTOMY;  Surgeon: Everitt Amber, MD;  Location: [facility];  Service: Gynecology;  Laterality: N/A;   LYSIS OF ADHESION  03/26/2014   Procedure: LYSIS OF ADHESION;  Surgeon: Everitt Amber, MD;  [facility];  Service: Gynecology;;   OMENTECTOMY  03/26/2014   Procedure: OMENTECTOMY;  Surgeon: Everitt Amber, MD;  [facility];  Service: Gynecology;;   SALPINGOOPHORECTOMY Bilateral 03/26/2014   Procedure: BILATERL SALPINGO OOPHORECTOMY;  Surgeon: Everitt Amber, MD;  [facility];  Service: Gynecology;  Laterality: Bilateral;   TONSILLECTOMY     VENTRAL HERNIA REPAIR  03/26/2014   Procedure: HERNIA REPAIR VENTRAL ADULT;  Surgeon: Excell Seltzer, MD;  Location: [facility];  Service: General;;  With MESH     Current Meds  Medication Sig   amLODipine (NORVASC) 10 MG tablet Take 10 mg by mouth daily.   aspirin EC 81 MG tablet Take 81 mg by mouth daily.   escitalopram (LEXAPRO) 10 MG tablet Take 10 mg by mouth daily.   estradiol (ESTRACE) 0.5 MG tablet Take 1 tablet (0.5 mg total) by mouth daily.   ezetimibe (ZETIA) 10 MG tablet Take 10 mg by mouth every morning.    Omega-3 Fatty Acids (FISH OIL PO) Take 3 capsules by mouth 2 (two) times daily.    Potassium 99 MG TABS Take 1 tablet by mouth  daily.   rosuvastatin (CRESTOR) 40 MG tablet Take 40 mg by mouth every morning.    [DISCONTINUED] aspirin 325 MG tablet Take 325 mg by mouth daily.   [DISCONTINUED] BIOTIN PO Take 3 tablets by mouth every morning.    [DISCONTINUED] FLUoxetine (PROZAC) 20 MG tablet TAKE 1 TABLET BY MOUTH EVERY DAY   [DISCONTINUED] ibuprofen (ADVIL,MOTRIN) 600 MG tablet Take 1 tablet (600 mg total) by mouth every 6 (six) hours as needed for mild pain.     Allergies:   Patient has no known allergies.   Social History   Tobacco Use   Smoking status: Light Tobacco Smoker    Packs/day: 0.25    Types: Cigarettes   Smokeless tobacco: Never Used  Substance Use Topics   Alcohol use: No    Alcohol/week: 0.0 standard drinks   Drug use: No     Family Hx: The patient's family history includes CAD (age of onset: 39) in her father; Cancer in her sister; Hypertension in her brother.  ROS:   Please see the history of present illness.    As stated in the HPI and negative for all other systems.   Prior CV studies:   The following studies were reviewed today:  Cath, labs  Labs/Other Tests and Data Reviewed:    EKG:  No ECG reviewed.  Recent Labs: No results found for requested labs within last 8760 hours.   Recent Lipid Panel Lab Results  Component Value Date/Time   TRIG 191 (H) 04/03/2014 05:01 AM    Wt Readings from Last 3 Encounters:  05/08/18 178 lb (80.7 kg)  05/20/17 189 lb 6.4 oz (85.9 kg)  12/06/16 189 lb (85.7 kg)     Objective:    Vital Signs:  BP 132/71    Pulse 76    Ht '5\' 8"'$  (1.727 m)    Wt 178 lb (80.7 kg)    BMI 27.06 kg/m    VITAL SIGNS:  reviewed GEN:  no acute distress EYES:  sclerae anicteric, EOMI - Extraocular Movements Intact RESPIRATORY:  normal respiratory effort, symmetric expansion NEURO:  alert and oriented x 3, no obvious focal deficit PSYCH:  normal affect  ASSESSMENT & PLAN:    CAD:   The patient has no new symptoms.  However, her bypass grafts  are quite old. I will bring the patient back for a POET (Plain Old Exercise Test). This will allow me to screen for obstructive coronary disease, risk stratify and very importantly provide a prescription for exercise.  I will order this for four months from now given the viris.  She will reduce the ASA to 81 mg daily.   DYSLIPIDEMIA:    Her LDL was 151 earlier this year.  This is despite the meds as above.  I will have her come back to our [facility] to  start [place].  She will see them in one month  HTN:  The blood pressure is at target. No change in medications is indicated. We will continue with therapeutic lifestyle changes (TLC).  HYPOKALEMIA:  I do note that her primary provider checked her potassium recently and it was 3.0.  She is had a history of low potassium.  She was allowed to take over-the-counter oral supplements.  I suggested that she follow-up and get that repeated.  If it is not been done by the time she sees [place] she needs a repeat be met in the office as well.   COVID-19 Education: The signs and symptoms of COVID-19 were discussed with the patient and how to seek care for testing (follow up with PCP or arrange E-visit).  The importance of social distancing was discussed today.  Time:   Today, I have spent 26 minutes with the patient with telehealth technology discussing the above problems.     Medication Adjustments/Labs and Tests Ordered: Current medicines are reviewed at length with the patient today.  Concerns regarding medicines are outlined above.   Tests Ordered: Orders Placed This Encounter  Procedures   EXERCISE TOLERANCE TEST (ETT)    Medication Changes: No orders of the defined types were placed in this encounter.   Disposition:  Follow up one year  Signed, Minus Breeding, MD  05/08/2018 12:27 PM    [facility]

## 2018-05-08 ENCOUNTER — Inpatient Hospital Stay: Payer: Medicare Other | Admitting: Gynecologic Oncology

## 2018-05-08 ENCOUNTER — Telehealth (INDEPENDENT_AMBULATORY_CARE_PROVIDER_SITE_OTHER): Payer: Medicare Other | Admitting: Cardiology

## 2018-05-08 ENCOUNTER — Encounter: Payer: Self-pay | Admitting: Cardiology

## 2018-05-08 VITALS — BP 132/71 | HR 76 | Ht 68.0 in | Wt 178.0 lb

## 2018-05-08 DIAGNOSIS — I10 Essential (primary) hypertension: Secondary | ICD-10-CM

## 2018-05-08 DIAGNOSIS — E785 Hyperlipidemia, unspecified: Secondary | ICD-10-CM

## 2018-05-08 DIAGNOSIS — I2510 Atherosclerotic heart disease of native coronary artery without angina pectoris: Secondary | ICD-10-CM

## 2018-05-08 DIAGNOSIS — E876 Hypokalemia: Secondary | ICD-10-CM

## 2018-05-08 HISTORY — DX: Hyperlipidemia, unspecified: E78.5

## 2018-05-08 NOTE — Patient Instructions (Addendum)
Medication Instructions:  DECREASE- aspirin 81 mg daily  If you need a refill on your cardiac medications before your next appointment, please call your pharmacy.  Labwork: None Ordered   Testing/Procedures: Your physician has requested that you have an exercise tolerance test in 4 Months. For further information please visit [email or web address]. Please also follow instruction sheet, as given.   Follow-Up: You will need a follow up appointment in 1 Year.  Please call our office 2 months in advance to schedule this appointment.  You may see Minus Breeding, MD or one of the following Advanced Practice Providers on your designated Care Team:   Rosaria Ferries, PA-C . Jory Sims, DNP, ANP    , Jory Sims, DNP, [place], you and your health needs are our priority.  As part of our continuing mission to provide you with exceptional heart care, we have created designated Provider Care Teams.  These Care Teams include your primary Cardiologist (physician) and Advanced Practice Providers (APPs -  Physician Assistants and Nurse Practitioners) who all work together to provide you with the care you need, when you need it.  Thank you for choosing [facility] at [facility]!!

## 2018-06-02 DIAGNOSIS — I519 Heart disease, unspecified: Secondary | ICD-10-CM

## 2018-06-02 HISTORY — DX: Heart disease, unspecified: I51.9

## 2018-06-13 ENCOUNTER — Telehealth: Payer: Self-pay | Admitting: *Deleted

## 2018-06-13 NOTE — Telephone Encounter (Signed)
Called and scheduled the patient to see Dr. Denman George on 6/22

## 2018-07-03 ENCOUNTER — Inpatient Hospital Stay: Payer: Medicare Other | Admitting: Gynecologic Oncology

## 2018-07-03 ENCOUNTER — Telehealth: Payer: Self-pay | Admitting: *Deleted

## 2018-07-19 NOTE — Telephone Encounter (Signed)
error 

## 2018-08-02 ENCOUNTER — Encounter: Payer: Self-pay | Admitting: Gynecologic Oncology

## 2018-08-02 ENCOUNTER — Inpatient Hospital Stay: Payer: Medicare Other | Attending: Gynecologic Oncology | Admitting: Gynecologic Oncology

## 2018-08-02 ENCOUNTER — Other Ambulatory Visit: Payer: Self-pay

## 2018-08-02 VITALS — BP 115/61 | HR 84 | Temp 97.7°F | Resp 18 | Ht 68.0 in | Wt 184.0 lb

## 2018-08-02 DIAGNOSIS — Z08 Encounter for follow-up examination after completed treatment for malignant neoplasm: Secondary | ICD-10-CM | POA: Diagnosis present

## 2018-08-02 DIAGNOSIS — Z90710 Acquired absence of both cervix and uterus: Secondary | ICD-10-CM

## 2018-08-02 DIAGNOSIS — C541 Malignant neoplasm of endometrium: Secondary | ICD-10-CM

## 2018-08-02 DIAGNOSIS — Z8542 Personal history of malignant neoplasm of other parts of uterus: Secondary | ICD-10-CM

## 2018-08-02 DIAGNOSIS — Z90722 Acquired absence of ovaries, bilateral: Secondary | ICD-10-CM | POA: Diagnosis not present

## 2018-08-02 NOTE — Patient Instructions (Signed)
Please notify Dr Denman George at phone number [phone] if you notice vaginal bleeding, new pelvic or abdominal pains, bloating, feeling full easy, or a change in bladder or bowel function.   Please contact Dr Serita Grit office (at [phone]) in March/April, 2021 to request an appointment with her for July, 2021.

## 2018-08-02 NOTE — Progress Notes (Signed)
FOLLOWUP VISIT: ENDOMETRIAL CANCER  CHIEF COMPLAINT: Chief Complaint  Patient presents with  . endometrial cancer    follow-up   Assessment:    73 y.o. 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 endometrial cancer    follow-up   Assessment:    73 y.o. 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   S/p ex lap, TAH, BSO on 03/26/14. no LVSI, no myometrial invasion, negative pelvic washings and  lymph nodes not sampled. No adjuvant therapy administered.   Plan: 1) Hx of stage IA clear cell endometrial cancer - no evidence of recurrence. 2) vasomotor symptoms of menopause - stable at 0.5mg  premarin daily. Renewed prescription. 3)  Return to clinic in 12 months to see me.   HPI:  [name] is a 73 y.o. year old initially seen in consultation on 01/08/15 for bilateral adnexal masses.  She then underwent an exploratory laparotomy, BSO (with hernia repair with Dr Excell Seltzer) on 01/11/73 without complications.  Her postoperative course was complicated by delayed return of bowel function (10 day hospital stay including TPN administration). 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 Her endometrium had an incidental finding of clear cell carcinoma in a polyp on final pathology (frozen section saw only benign endometrial polyp).  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  Interval Hx:  She is seen today for a surveillance examiantion. She denies symptoms concerning for recurrence including no vaginal bleeding, no lower extremity edema, no abdominal pain or  bloating.  She has no changes in medical therapy.   No Known Allergies Past Medical History:  Diagnosis Date  . CAD (coronary artery disease)    2009 RCA occluded proximally, LAD occluded proximally, left main 90% stenosis, LIMA to the LAD widely patent, SVG to OM and diagonal patent, SVG RCA patent. This stenosis after the anastomosis of 80% in the native vessel. This was treated with PCI.  Marland Kitchen Collagen vascular disease ([place])   . HTN (hypertension)   . Hyperlipidemia    Past Surgical History:  Procedure Laterality Date  . ABDOMINAL AORTIC ANEURYSM REPAIR  2003  . ABDOMINAL HYSTERECTOMY N/A 03/26/2014   Procedure: TOTAL HYSTERECTOMY ABDOMINAL;  Surgeon: Everitt Amber, MD;  [facility];  Service: Gynecology;  Laterality: N/A;  . COLON SURGERY  2007   blockage  . CORONARY ARTERY BYPASS GRAFT  1994   LIMA to the LAD, SVG to RCA, SVG sequential to OM and diagonal,  . LAPAROTOMY N/A 03/26/2014   Procedure: EXPLORATORY LAPAROTOMY;  Surgeon: Everitt Amber, MD;  [facility];  Service: Gynecology;  Laterality: N/A;  . LYSIS OF ADHESION  03/26/2014   Procedure: LYSIS OF ADHESION;  Surgeon: Everitt Amber, MD;  [facility];  Service: Gynecology;;  . OMENTECTOMY  03/26/2014   Procedure: OMENTECTOMY;  Surgeon: Everitt Amber, MD;  [facility];  Service: Gynecology;;  . SALPINGOOPHORECTOMY Bilateral 03/26/2014   Procedure: BILATERL SALPINGO OOPHORECTOMY;  Surgeon: Everitt Amber, MD;  [facility];  Service: Gynecology;  Laterality: Bilateral;  . TONSILLECTOMY    . VENTRAL HERNIA REPAIR  03/26/2014  Procedure: HERNIA REPAIR VENTRAL ADULT;  Surgeon: Excell Seltzer, MD;  [facility];  Service: General;;  With MESH   Family History  Problem Relation Age of Onset  . CAD Father 40       Died of MI  . Hypertension Brother   . Cancer Sister        Ovarian   Social History   Socioeconomic History  . Marital status: Married    Spouse name: Not on file  . Number of children: 3  . Years  of education: Not on file  . Highest education level: Not on file  Occupational History  . Not on file  Social Needs  . Financial resource strain: Not on file  . Food insecurity    Worry: Not on file    Inability: Not on file  . Transportation needs    Medical: Not on file    Non-medical: Not on file  Tobacco Use  . Smoking status: Light Tobacco Smoker    Packs/day: 0.25    Types: Cigarettes  . Smokeless tobacco: Never Used  Substance and Sexual Activity  . Alcohol use: No    Alcohol/week: 0.0 standard drinks  . Drug use: No  . Sexual activity: Not Currently  Lifestyle  . Physical activity    Days per week: Not on file    Minutes per session: Not on file  . Stress: Not on file  Relationships  . Social Herbalist on phone: Not on file    Gets together: Not on file    Attends religious service: Not on file    Active member of club or organization: Not on file    Attends meetings of clubs or organizations: Not on file    Relationship status: Not on file  . Intimate partner violence    Fear of current or ex partner: Not on file    Emotionally abused: Not on file    Physically abused: Not on file    Forced sexual activity: Not on file  Other Topics Concern  . Not on file  Social History Narrative   Lives with husband and 29 year old.     Current Outpatient Medications on File Prior to Visit  Medication Sig Dispense Refill  . amLODipine (NORVASC) 10 MG tablet Take 10 mg by mouth daily.    Marland Kitchen aspirin EC 81 MG tablet Take 81 mg by mouth daily.    Marland Kitchen escitalopram (LEXAPRO) 10 MG tablet Take 10 mg by mouth daily.    Marland Kitchen estradiol (ESTRACE) 1 MG tablet estradiol 1 mg tablet  TAKE 1 TABLET BY MOUTH ONCE DAILY    . ezetimibe (ZETIA) 10 MG tablet Take 10 mg by mouth every morning.     . Omega-3 Fatty Acids (FISH OIL PO) Take 3 capsules by mouth 2 (two) times daily.     . rosuvastatin (CRESTOR) 40 MG tablet Take 40 mg by mouth every morning.      No current  facility-administered medications on file prior to visit.     Review of systems: Constitutional:  She has no weight gain or weight loss. She has no fever or chills. Eyes: No blurred vision Ears, Nose, Mouth, Throat: No dizziness, headaches or changes in hearing. No mouth sores. Cardiovascular: No chest pain, palpitations or edema. Respiratory:  No shortness of breath, wheezing or cough Gastrointestinal: She has normal bowel movements without diarrhea. She denies any nausea or vomiting. She denies blood in her stool or heart  burn. no constipation. Genitourinary:  She denies pelvic pain, pelvic pressure or changes in her urinary function. She has no hematuria, dysuria, or incontinence. She has no irregular vaginal bleeding or vaginal discharge Musculoskeletal: Denies muscle weakness or joint pains.  Skin:  She has no skin changes, rashes or itching Neurological:  Denies dizziness or headaches. No neuropathy, no numbness or tingling. Psychiatric:  She denies depression or anxiety. Hematologic/Lymphatic:   No easy bruising or bleeding   Physical Exam: Blood pressure 115/61, pulse 84, temperature 97.7 F (36.5 C), temperature source Oral, resp. rate 18, height 5\' 8"  (1.727 m), weight 184 lb (83.5 kg), SpO2 97 %. General: Well dressed, well nourished in no apparent distress.   HEENT:  Normocephalic and atraumatic, no lesions.  Extraocular muscles intact. Sclerae anicteric. Pupils equal, round, reactive. No mouth sores or ulcers. Thyroid is normal size, not nodular, midline. Skin:  No lesions or rashes. Lymph nodes: no cervical or inguinal adenopathy Breasts:  deferred Lungs:  CTAB Cardiovascular: RRR Abdomen:  Soft, nontender, nondistended.  No palpable masses.  No hepatosplenomegaly.  No ascites. Normal bowel sounds.  No hernias.  Incision is well healed Genitourinary: Normal EGBUS  Vaginal cuff intact.  No bleeding or discharge.  No cul de sac fullness. No masses. No lesions. Extremities:  No cyanosis, clubbing or edema.  No calf tenderness or erythema. No palpable cords. Psychiatric: Mood and affect are appropriate. Neurological: Awake, alert and oriented x 3. Sensation is intact, no neuropathy.  Musculoskeletal: No pain, normal strength and range of motion.  Thereasa Solo, MD

## 2018-08-15 ENCOUNTER — Telehealth (HOSPITAL_COMMUNITY): Payer: Self-pay

## 2018-08-15 NOTE — Telephone Encounter (Signed)
New message   Just an FYI. We have made several attempts to contact this patient including sending a letter to schedule or reschedule their EXERCISE TOLERANCE TEST. We will be removing the patient from the W[facility].    7.30.20 @ 9:00am lm on home vm - Hudsyn Barich  7.22.20 @ 12:20pm lm on home vm -Arrington Bencomo 7.6.20 @ 2:54pm lm on home vm  - Burnice Vassel

## 2018-09-11 NOTE — Progress Notes (Signed)
Cardiology Office Note:    Date:  09/12/2018   ID:  MIXTLI TOWLES, DOB Oct 10, 1945, MRN [id number]  PCP:  Patient, No Pcp Per  Cardiologist:  Shirlee More, MD    Referring MD: No ref. provider found    ASSESSMENT:    1. Mixed hyperlipidemia   2. Essential (primary) hypertension   3. Coronary artery disease involving coronary bypass graft of native heart with angina pectoris ([place])   4. Aortic heart murmur    PLAN:    In order of problems listed above:  1. Stable post revascularization continue aspirin antihypertensive amlodipine and lipid-lowering therapy with a high intensity statin and Zetia.  New York Heart Association class I at this time I would not pursue an ischemia evaluation 2. Stable BP at target continue calcium channel blocker 3. Hyperlipidemia clinically appears to have familial if LDL is above target at PCSK9 therapy 4. Echocardiogram regarding aortic stenosis   Next appointment: 6 months   Medication Adjustments/Labs and Tests Ordered: Current medicines are reviewed at length with the patient today.  Concerns regarding medicines are outlined above.  Orders Placed This Encounter  Procedures   EKG 12-Lead   ECHOCARDIOGRAM COMPLETE   No orders of the defined types were placed in this encounter.   Chief Complaint  Patient presents with   other    No complaints today est care Hx CABG. Meds reviewed verbally with pt.    History of Present Illness:    KAIDEE SCHIEFERSTEIN is a 73 y.o. female with a hx of CAD, CABG hypertension, hyperlipidemia and hypokalemia last seen 05/08/2018. Compliance with diet, lifestyle and medications: Yes  Her lipids are followed outside physician's office and she tells me her LDL remains above target despite a high intensity statin and Zetia.  I requested dose and if that is true she would benefit from PCSK9 therapy.  She obviously has familial hyperlipidemia.  No angina dyspnea palpitation or syncope.  She has an impressive murmur on exam  grade 2/6 to 3/6 in the aortic area radiates to the right carotid does not encompass S2 no AR consistent with a moderate degree of aortic stenosis it was not noted on previous examinations an echocardiogram in 2012 had aortic valve sclerosis.  I reviewed the findings will perform an echocardiogram as she had severe aortic stenosis would be a candidate for TAVR.  No anginal discomfort and after discussion we will not pursue a myocardial perfusion study or ischemia evaluation at this time Past Medical History:  Diagnosis Date   CAD (coronary artery disease)    2009 RCA occluded proximally, LAD occluded proximally, left main 90% stenosis, LIMA to the LAD widely patent, SVG to OM and diagonal patent, SVG RCA patent. This stenosis after the anastomosis of 80% in the native vessel. This was treated with PCI.   Collagen vascular disease ([place])    HTN (hypertension)    Hyperlipidemia     Past Surgical History:  Procedure Laterality Date   ABDOMINAL AORTIC ANEURYSM REPAIR  2003   ABDOMINAL HYSTERECTOMY N/A 03/26/2014   Procedure: TOTAL HYSTERECTOMY ABDOMINAL;  Surgeon: Everitt Amber, MD;  Location: [facility];  Service: Gynecology;  Laterality: N/A;   COLON SURGERY  2007   blockage   CORONARY ARTERY BYPASS GRAFT  1994   LIMA to the LAD, SVG to RCA, SVG sequential to OM and diagonal,   LAPAROTOMY N/A 03/26/2014   Procedure: EXPLORATORY LAPAROTOMY;  Surgeon: Everitt Amber, MD;  Location: [facility];  Service: Gynecology;  Laterality:  N/A;   LYSIS OF ADHESION  03/26/2014   Procedure: LYSIS OF ADHESION;  Surgeon: Everitt Amber, MD;  Location: [facility];  Service: Gynecology;;   OMENTECTOMY  03/26/2014   Procedure: OMENTECTOMY;  Surgeon: Everitt Amber, MD;  Location: [facility];  Service: Gynecology;;   SALPINGOOPHORECTOMY Bilateral 03/26/2014   Procedure: BILATERL SALPINGO OOPHORECTOMY;  Surgeon: Everitt Amber, MD;  Location: [facility];  Service: Gynecology;  Laterality: Bilateral;   TONSILLECTOMY     VENTRAL HERNIA REPAIR   03/26/2014   Procedure: HERNIA REPAIR VENTRAL ADULT;  Surgeon: Excell Seltzer, MD;  Location: [facility];  Service: General;;  With MESH    Current Medications: Current Meds  Medication Sig   amLODipine (NORVASC) 10 MG tablet Take 10 mg by mouth daily.   aspirin EC 81 MG tablet Take 81 mg by mouth daily.   escitalopram (LEXAPRO) 10 MG tablet Take 10 mg by mouth daily.   estradiol (ESTRACE) 1 MG tablet estradiol 1 mg tablet  TAKE 1 TABLET BY MOUTH ONCE DAILY   ezetimibe (ZETIA) 10 MG tablet Take 10 mg by mouth every morning.    Omega-3 Fatty Acids (FISH OIL PO) Take 3 capsules by mouth 2 (two) times daily.    potassium chloride (K-DUR) 10 MEQ tablet Take 10 mEq by mouth daily.   rosuvastatin (CRESTOR) 40 MG tablet Take 40 mg by mouth every morning.      Allergies:   Patient has no known allergies.   Social History   Socioeconomic History   Marital status: Married    Spouse name: Not on file   Number of children: 3   Years of education: Not on file   Highest education level: Not on file  Occupational History   Not on file  Social Needs   Financial resource strain: Not on file   Food insecurity    Worry: Not on file    Inability: Not on file   Transportation needs    Medical: Not on file    Non-medical: Not on file  Tobacco Use   Smoking status: Light Tobacco Smoker    Packs/day: 0.25    Types: Cigarettes   Smokeless tobacco: Never Used  Substance and Sexual Activity   Alcohol use: No    Alcohol/week: 0.0 standard drinks   Drug use: No   Sexual activity: Not Currently  Lifestyle   Physical activity    Days per week: Not on file    Minutes per session: Not on file   Stress: Not on file  Relationships   Social connections    Talks on phone: Not on file    Gets together: Not on file    Attends religious service: Not on file    Active member of club or organization: Not on file    Attends meetings of clubs or organizations: Not on file     Relationship status: Not on file  Other Topics Concern   Not on file  Social History Narrative   Lives with husband and 78 year old.       Family History: The patient's family history includes CAD (age of onset: 38) in her father; Cancer in her sister; Hypertension in her brother. ROS:   Please see the history of present illness.    All other systems reviewed and are negative.  EKGs/Labs/Other Studies Reviewed:    The following studies were reviewed today:  EKG:  EKG ordered today and personally reviewed.  The ekg ordered today demonstrates nonspecific ST T abnormalities  Recent Labs: No results found for requested labs within last 8760 hours.  Recent Lipid Panel    Component Value Date/Time   TRIG 191 (H) 04/03/2014 0501    Physical Exam:    VS:  BP 106/80 (BP Location: Left Arm, Patient Position: Sitting, Cuff Size: Normal)    Pulse 75    Ht 5\' 9"  (1.753 m)    Wt 185 lb 9 oz (84.2 kg)    SpO2 98%    BMI 27.40 kg/m     Wt Readings from Last 3 Encounters:  09/12/18 185 lb 9 oz (84.2 kg)  08/02/18 184 lb (83.5 kg)  05/08/18 178 lb (80.7 kg)     GEN:  Well nourished, well developed in no acute distress HEENT: Normal NECK: No JVD; No carotid bruits LYMPHATICS: No lymphadenopathy CARDIAC: 2-3 of 6 harsh systolic ejection murmur aortic area to right clavicle does not encompass S2 or radiate to the carotids RRR, rubs, gallops RESPIRATORY:  Clear to auscultation without rales, wheezing or rhonchi  ABDOMEN: Soft, non-tender, non-distended MUSCULOSKELETAL:  No edema; No deformity  SKIN: Warm and dry NEUROLOGIC:  Alert and oriented x 3 PSYCHIATRIC:  Normal affect    Signed, Shirlee More, MD  09/12/2018 4:23 PM    [facility]

## 2018-09-12 ENCOUNTER — Other Ambulatory Visit: Payer: Self-pay

## 2018-09-12 ENCOUNTER — Encounter: Payer: Self-pay | Admitting: *Deleted

## 2018-09-12 ENCOUNTER — Ambulatory Visit (INDEPENDENT_AMBULATORY_CARE_PROVIDER_SITE_OTHER): Payer: Medicare Other | Admitting: Cardiology

## 2018-09-12 VITALS — BP 106/80 | HR 75 | Ht 69.0 in | Wt 185.6 lb

## 2018-09-12 DIAGNOSIS — I358 Other nonrheumatic aortic valve disorders: Secondary | ICD-10-CM | POA: Diagnosis not present

## 2018-09-12 DIAGNOSIS — E782 Mixed hyperlipidemia: Secondary | ICD-10-CM | POA: Diagnosis not present

## 2018-09-12 DIAGNOSIS — I25709 Atherosclerosis of coronary artery bypass graft(s), unspecified, with unspecified angina pectoris: Secondary | ICD-10-CM

## 2018-09-12 DIAGNOSIS — I10 Essential (primary) hypertension: Secondary | ICD-10-CM | POA: Diagnosis not present

## 2018-09-12 NOTE — Patient Instructions (Signed)
Medication Instructions:  Your physician recommends that you continue on your current medications as directed. Please refer to the Current Medication list given to you today.  If you need a refill on your cardiac medications before your next appointment, please call your pharmacy.   Lab work: None  If you have labs (blood work) drawn today and your tests are completely normal, you will receive your results only by: Marland Kitchen MyChart Message (if you have MyChart) OR . A paper copy in the mail If you have any lab test that is abnormal or we need to change your treatment, we will call you to review the results.  Testing/Procedures: You had an EKG today.   Your physician has requested that you have an echocardiogram. Echocardiography is a painless test that uses sound waves to create images of your heart. It provides your doctor with information about the size and shape of your heart and how well your heart's chambers and valves are working. This procedure takes approximately one hour. There are no restrictions for this procedure.  Follow-Up: At [facility], you and your health needs are our priority.  As part of our continuing mission to provide you with exceptional heart care, we have created designated Provider Care Teams.  These Care Teams include your primary Cardiologist (physician) and Advanced Practice Providers (APPs -  Physician Assistants and Nurse Practitioners) who all work together to provide you with the care you need, when you need it. You will need a follow up appointment in 6 months.  Please call our office 2 months in advance to schedule this appointment.      Echocardiogram An echocardiogram is a procedure that uses painless sound waves (ultrasound) to produce an image of the heart. Images from an echocardiogram can provide important information about:  Signs of coronary artery disease (CAD).  Aneurysm detection. An aneurysm is a weak or damaged part of an artery wall that  bulges out from the normal force of blood pumping through the body.  Heart size and shape. Changes in the size or shape of the heart can be associated with certain conditions, including heart failure, aneurysm, and CAD.  Heart muscle function.  Heart valve function.  Signs of a past heart attack.  Fluid buildup around the heart.  Thickening of the heart muscle.  A tumor or infectious growth around the heart valves. Tell a health care provider about:  Any allergies you have.  All medicines you are taking, including vitamins, herbs, eye drops, creams, and over-the-counter medicines.  Any blood disorders you have.  Any surgeries you have had.  Any medical conditions you have.  Whether you are pregnant or may be pregnant. What are the risks? Generally, this is a safe procedure. However, problems may occur, including:  Allergic reaction to dye (contrast) that may be used during the procedure. What happens before the procedure? No specific preparation is needed. You may eat and drink normally. What happens during the procedure?   An IV tube may be inserted into one of your veins.  You may receive contrast through this tube. A contrast is an injection that improves the quality of the pictures from your heart.  A gel will be applied to your chest.  A wand-like tool (transducer) will be moved over your chest. The gel will help to transmit the sound waves from the transducer.  The sound waves will harmlessly bounce off of your heart to allow the heart images to be captured in real-time motion. The images  will be recorded on a computer. The procedure may vary among health care providers and hospitals. What happens after the procedure?  You may return to your normal, everyday life, including diet, activities, and medicines, unless your health care provider tells you not to do that. Summary  An echocardiogram is a procedure that uses painless sound waves (ultrasound) to produce  an image of the heart.  Images from an echocardiogram can provide important information about the size and shape of your heart, heart muscle function, heart valve function, and fluid buildup around your heart.  You do not need to do anything to prepare before this procedure. You may eat and drink normally.  After the echocardiogram is completed, you may return to your normal, everyday life, unless your health care provider tells you not to do that. This information is not intended to replace advice given to you by your health care provider. Make sure you discuss any questions you have with your health care provider. Document Released: 12/26/1999 Document Revised: 04/20/2018 Document Reviewed: 01/31/2016 Elsevier Patient Education  2020 [employer].

## 2018-09-15 ENCOUNTER — Ambulatory Visit (HOSPITAL_BASED_OUTPATIENT_CLINIC_OR_DEPARTMENT_OTHER)
Admission: RE | Admit: 2018-09-15 | Discharge: 2018-09-15 | Disposition: A | Discharge status: Home / Self Care | Payer: Medicare Other | Source: Ambulatory Visit | Attending: Cardiology | Admitting: Cardiology

## 2018-09-15 ENCOUNTER — Other Ambulatory Visit: Payer: Self-pay

## 2018-09-15 DIAGNOSIS — I25709 Atherosclerosis of coronary artery bypass graft(s), unspecified, with unspecified angina pectoris: Secondary | ICD-10-CM | POA: Diagnosis present

## 2018-09-15 DIAGNOSIS — I10 Essential (primary) hypertension: Secondary | ICD-10-CM | POA: Insufficient documentation

## 2018-09-15 NOTE — Progress Notes (Signed)
  Echocardiogram 2D Echocardiogram has been performed.  [name] 09/15/2018, 3:44 PM

## 2019-04-27 ENCOUNTER — Ambulatory Visit: Payer: Medicare Other | Admitting: Cardiology

## 2019-04-27 NOTE — Progress Notes (Deleted)
Cardiology Office Note:    Date:  04/27/2019   ID:  [name], DOB [birth year], MRN [id number]  PCP:  Patient, No Pcp Per  Cardiologist:  Shirlee More, MD    Referring MD: No ref. provider found    ASSESSMENT:    No diagnosis found. PLAN:    In order of problems listed above:  1. ***   Next appointment: ***   Medication Adjustments/Labs and Tests Ordered: Current medicines are reviewed at length with the patient today.  Concerns regarding medicines are outlined above.  No orders of the defined types were placed in this encounter.  No orders of the defined types were placed in this encounter.   No chief complaint on file.   History of Present Illness:    [name] is a 74 y.o. female with a hx of CAD, CABG hypertension, familial hyperlipidemia and hypokalemia    last seen 09/12/2018.  An echocardiogram performed following that visit showing normal left ventricular size and function mild to moderate left atrial enlargement and mild aortic stenosis.  Peak and mean gradients 23 and 13 mmHg Compliance with diet, lifestyle and medications: *** Past Medical History:  Diagnosis Date  . CAD (coronary artery disease)    2009 RCA occluded proximally, LAD occluded proximally, left main 90% stenosis, LIMA to the LAD widely patent, SVG to OM and diagonal patent, SVG RCA patent. This stenosis after the anastomosis of 80% in the native vessel. This was treated with PCI.  Marland Kitchen Collagen vascular disease ([place])   . HTN (hypertension)   . Hyperlipidemia     Past Surgical History:  Procedure Laterality Date  . ABDOMINAL AORTIC ANEURYSM REPAIR  2003  . ABDOMINAL HYSTERECTOMY N/A 03/26/2014   Procedure: TOTAL HYSTERECTOMY ABDOMINAL;  Surgeon: Everitt Amber, MD;  Location: [facility];  Service: Gynecology;  Laterality: N/A;  . COLON SURGERY  2007   blockage  . CORONARY ARTERY BYPASS GRAFT  1994   LIMA to the LAD, SVG to RCA, SVG sequential to OM and diagonal,  . LAPAROTOMY N/A 03/26/2014   Procedure: EXPLORATORY LAPAROTOMY;  Surgeon: Everitt Amber, MD;  Location: [facility];  Service: Gynecology;  Laterality: N/A;  . LYSIS OF ADHESION  03/26/2014   Procedure: LYSIS OF ADHESION;  Surgeon: Everitt Amber, MD;  [facility];  Service: Gynecology;;  . OMENTECTOMY  03/26/2014   Procedure: OMENTECTOMY;  Surgeon: Everitt Amber, MD;  [facility];  Service: Gynecology;;  . SALPINGOOPHORECTOMY Bilateral 03/26/2014   Procedure: BILATERL SALPINGO OOPHORECTOMY;  Surgeon: Everitt Amber, MD;  Location: [facility];  Service: Gynecology;  Laterality: Bilateral;  . TONSILLECTOMY    . VENTRAL HERNIA REPAIR  03/26/2014   Procedure: HERNIA REPAIR VENTRAL ADULT;  Surgeon: Excell Seltzer, MD;  [facility];  Service: General;;  With MESH    Current Medications: No outpatient medications have been marked as taking for the 04/27/19 encounter (Appointment) with Richardo Priest, MD.     Allergies:   Patient has no known allergies.   Social History   Socioeconomic History  . Marital status: Married    Spouse name: Not on file  . Number of children: 3  . Years of education: Not on file  . Highest education level: Not on file  Occupational History  . Not on file  Tobacco Use  . Smoking status: Light Tobacco Smoker    Packs/day: 0.25    Types: Cigarettes  . Smokeless tobacco: Never Used  Substance and Sexual Activity  . Alcohol  use: No    Alcohol/week: 0.0 standard drinks  . Drug use: No  . Sexual activity: Not Currently  Other Topics Concern  . Not on file  Social History Narrative   Lives with husband and 88 year old.     Social Determinants of Health   Financial Resource Strain:   . Difficulty of Paying Living Expenses:   Food Insecurity:   . Worried About Charity fundraiser in the Last Year:   . Arboriculturist in the Last Year:   Transportation Needs:   . Film/video editor (Medical):   Marland Kitchen Lack of Transportation (Non-Medical):   Physical Activity:   . Days of Exercise per Week:    . Minutes of Exercise per Session:   Stress:   . Feeling of Stress :   Social Connections:   . Frequency of Communication with Friends and Family:   . Frequency of Social Gatherings with Friends and Family:   . Attends Religious Services:   . Active Member of Clubs or Organizations:   . Attends Archivist Meetings:   Marland Kitchen Marital Status:      Family History: The patient's ***family history includes CAD (age of onset: 58) in her father; Cancer in her sister; Hypertension in her brother. ROS:   Please see the history of present illness.    All other systems reviewed and are negative.  EKGs/Labs/Other Studies Reviewed:    The following studies were reviewed today:  EKG:  EKG ordered today and personally reviewed.  The ekg ordered today demonstrates ***  Recent Labs: No results found for requested labs within last 8760 hours.  Recent Lipid Panel    Component Value Date/Time   TRIG 191 (H) 04/03/2014 0501    Physical Exam:    VS:  There were no vitals taken for this visit.    Wt Readings from Last 3 Encounters:  09/12/18 185 lb 9 oz (84.2 kg)  08/02/18 184 lb (83.5 kg)  05/08/18 178 lb (80.7 kg)     GEN: *** Well nourished, well developed in no acute distress HEENT: Normal NECK: No JVD; No carotid bruits LYMPHATICS: No lymphadenopathy CARDIAC: ***RRR, no murmurs, rubs, gallops RESPIRATORY:  Clear to auscultation without rales, wheezing or rhonchi  ABDOMEN: Soft, non-tender, non-distended MUSCULOSKELETAL:  No edema; No deformity  SKIN: Warm and dry NEUROLOGIC:  Alert and oriented x 3 PSYCHIATRIC:  Normal affect    Signed, Shirlee More, MD  04/27/2019 7:55 AM    [place]

## 2019-05-05 ENCOUNTER — Encounter: Payer: Self-pay | Admitting: Cardiology

## 2019-06-19 ENCOUNTER — Ambulatory Visit: Payer: Medicare Other | Admitting: Cardiology

## 2019-06-21 NOTE — Progress Notes (Signed)
Cardiology Office Note:    Date:  06/22/2019   ID:  [name], DOB 1945-12-02, MRN [id number]  PCP:  Patient, No Pcp Per  Cardiologist:  Shirlee More, MD    Referring MD: No ref. provider found    ASSESSMENT:    1. Nonrheumatic aortic valve stenosis   2. Coronary artery disease involving coronary bypass graft of native heart with angina pectoris ([place])   3. Essential hypertension   4. Hypokalemia   5. Dyslipidemia    PLAN:    In order of problems listed above:  1. Mild plan to recheck echocardiogram in about 1 to 2 years 2. Stable no angina on current medical therapy New York Heart Association class I continue current treatment with aspirin and intensified lipid-lowering by adding PCSK9 to her high intensity statin and Zetia 3. At target continue calcium channel blocker 4. On potassium supplements followed by her PCP 5. See above start PCSK9 inhibitor LDL above target on high intensity statin and Zetia with familial hyperlipidemia   Next appointment: 6 months   Medication Adjustments/Labs and Tests Ordered: Current medicines are reviewed at length with the patient today.  Concerns regarding medicines are outlined above.  No orders of the defined types were placed in this encounter.  No orders of the defined types were placed in this encounter.   Chief Complaint  Patient presents with  . Follow-up    History of Present Illness:    [name] is a 74 y.o. female with a hx of CAD, CABG in 1994 and subsequent PCI in 2009, hypertension, hyperlipidemia and hypokalemia  last seen 09/12/2018. Compliance with diet, lifestyle and medications: Yes  She is quite decided not to accept COVID-19 vaccine.  She respects my recommendation but is made her mind up.  Since seen in the office she has had no chest pain shortness of breath palpitation or syncope.  Recent labs show significant residual hyperlipidemia on high intensity statin and Zetia and she has familial hyperlipidemia.   After discussion of benefits she will start Repatha we will recheck her labs in 6 weeks.  If she is in a LDL of less than 50 we will withdraw Zetia at that time.  She has had no TIA chest pain shortness of breath palpitation or syncope.  She also has new hypokalemia  Echo 09/15/2018 with mild AS: 1. The left ventricle has normal systolic function, with an ejection  fraction of 55-60%. The cavity size was normal. There is mild concentric  left ventricular hypertrophy. Left ventricular diastolic Doppler  parameters are consistent with impaired  relaxation. No evidence of left ventricular regional wall motion  abnormalities.  2. Left atrial size was mild-moderately dilated.  3. No evidence of mitral valve stenosis.  4. The aortic valve is tricuspid. Moderate thickening of the aortic  valve. Moderate calcification of the aortic valve. Mild stenosis of the  aortic valve.  5. The ascending aorta and aortic root are normal in size and structure. Past Medical History:  Diagnosis Date  . CAD (coronary artery disease)    2009 RCA occluded proximally, LAD occluded proximally, left main 90% stenosis, LIMA to the LAD widely patent, SVG to OM and diagonal patent, SVG RCA patent. This stenosis after the anastomosis of 80% in the native vessel. This was treated with PCI.  Marland Kitchen Collagen vascular disease ([place])   . HTN (hypertension)   . Hyperlipidemia     Past Surgical History:  Procedure Laterality Date  . ABDOMINAL AORTIC ANEURYSM  REPAIR  2003  . ABDOMINAL HYSTERECTOMY N/A 03/26/2014   Procedure: TOTAL HYSTERECTOMY ABDOMINAL;  Surgeon: Everitt Amber, MD;  Location: [facility];  Service: Gynecology;  Laterality: N/A;  . COLON SURGERY  2007   blockage  . CORONARY ARTERY BYPASS GRAFT  1994   LIMA to the LAD, SVG to RCA, SVG sequential to OM and diagonal,  . LAPAROTOMY N/A 03/26/2014   Procedure: EXPLORATORY LAPAROTOMY;  Surgeon: Everitt Amber, MD;  Location: [facility];  Service: Gynecology;  Laterality: N/A;  .  LYSIS OF ADHESION  03/26/2014   Procedure: LYSIS OF ADHESION;  Surgeon: Everitt Amber, MD;  Location: [facility];  Service: Gynecology;;  . OMENTECTOMY  03/26/2014   Procedure: OMENTECTOMY;  Surgeon: Everitt Amber, MD;  [facility]: [facility];  Service: Gynecology;;  . SALPINGOOPHORECTOMY Bilateral 03/26/2014   Procedure: BILATERL SALPINGO OOPHORECTOMY;  Surgeon: Everitt Amber, MD;  Location: [facility];  Service: Gynecology;  Laterality: Bilateral;  . TONSILLECTOMY    . VENTRAL HERNIA REPAIR  03/26/2014   Procedure: HERNIA REPAIR VENTRAL ADULT;  Surgeon: Excell Seltzer, MD;  Location: [facility];  Service: General;;  With MESH    Current Medications: Current Meds  Medication Sig  . amLODipine (NORVASC) 10 MG tablet Take 10 mg by mouth daily.  Marland Kitchen aspirin EC 81 MG tablet Take 81 mg by mouth daily.  . Cyanocobalamin (B-12 COMPLIANCE INJECTION IJ) Inject as directed.  . escitalopram (LEXAPRO) 10 MG tablet Take 20 mg by mouth daily.  Marland Kitchen estradiol (ESTRACE) 1 MG tablet estradiol 1 mg tablet  TAKE 1 TABLET BY MOUTH ONCE DAILY  . ezetimibe (ZETIA) 10 MG tablet Take 10 mg by mouth every morning.   . Omega-3 Fatty Acids (FISH OIL PO) Take 3 capsules by mouth 2 (two) times daily.   Marland Kitchen oxybutynin (DITROPAN-XL) 5 MG 24 hr tablet Take by mouth.  . rosuvastatin (CRESTOR) 40 MG tablet Take 40 mg by mouth every morning.      Allergies:   Patient has no known allergies.   Social History   Socioeconomic History  . Marital status: Married    Spouse name: Not on file  . Number of children: 3  . Years of education: Not on file  . Highest education level: Not on file  Occupational History  . Not on file  Tobacco Use  . Smoking status: Light Tobacco Smoker    Packs/day: 0.25    Types: Cigarettes  . Smokeless tobacco: Never Used  Vaping Use  . Vaping Use: Never used  Substance and Sexual Activity  . Alcohol use: No    Alcohol/week: 0.0 standard drinks  . Drug use: No  . Sexual activity: Not Currently  Other Topics  Concern  . Not on file  Social History Narrative   Lives with husband and 69 year old.     Social Determinants of Health   Financial Resource Strain:   . Difficulty of Paying Living Expenses:   Food Insecurity:   . Worried About Charity fundraiser in the Last Year:   . Arboriculturist in the Last Year:   Transportation Needs:   . Film/video editor (Medical):   Marland Kitchen Lack of Transportation (Non-Medical):   Physical Activity:   . Days of Exercise per Week:   . Minutes of Exercise per Session:   Stress:   . Feeling of Stress :   Social Connections:   . Frequency of Communication with Friends and Family:   . Frequency of Social Gatherings with  Friends and Family:   . Attends Religious Services:   . Active Member of Clubs or Organizations:   . Attends Archivist Meetings:   Marland Kitchen Marital Status:      Family History: The patient's family history includes CAD (age of onset: 62) in her father; Cancer in her sister; Hypertension in her brother. ROS:   Please see the history of present illness.    All other systems reviewed and are negative.  EKGs/Labs/Other Studies Reviewed:    The following studies were reviewed today:  EKG:  EKG ordered today and personally reviewed.  The ekg ordered today demonstrates sinus rhythm nonspecific ST and T abnormality unchanged from her last EKG  Recent Labs: 04/24/2019: CMP with a potassium of 3.0 GFR 49 cc normal liver function test Lipid profile cholesterol 188 triglyceride 138 HDL 38 LDL 122 TSH normal 2.3H Vitamin B12 level depleted 144 Hemoglobin 13.9 Physical Exam:    VS:  BP 132/82   Pulse 80   Ht 5\' 9"  (1.753 m)   Wt 184 lb (83.5 kg)   SpO2 93%   BMI 27.17 kg/m     Wt Readings from Last 3 Encounters:  06/22/19 184 lb (83.5 kg)  09/12/18 185 lb 9 oz (84.2 kg)  08/02/18 184 lb (83.5 kg)     GEN:  Well nourished, well developed in no acute distress HEENT: Normal NECK: No JVD; No carotid bruits LYMPHATICS: No  lymphadenopathy CARDIAC: Grade 1/6 to 2/6 midsystolic ejection murmur does not encompass S2 there is no click and no aortic regurgitation RRR, no rubs, gallops RESPIRATORY:  Clear to auscultation without rales, wheezing or rhonchi  ABDOMEN: Soft, non-tender, non-distended MUSCULOSKELETAL:  No edema; No deformity  SKIN: Warm and dry NEUROLOGIC:  Alert and oriented x 3 PSYCHIATRIC:  Normal affect    Signed, Shirlee More, MD  06/22/2019 11:35 AM    [place]

## 2019-06-22 ENCOUNTER — Ambulatory Visit (INDEPENDENT_AMBULATORY_CARE_PROVIDER_SITE_OTHER): Payer: Medicare Other | Admitting: Cardiology

## 2019-06-22 ENCOUNTER — Other Ambulatory Visit: Payer: Self-pay

## 2019-06-22 ENCOUNTER — Telehealth: Payer: Self-pay

## 2019-06-22 ENCOUNTER — Encounter: Payer: Self-pay | Admitting: Cardiology

## 2019-06-22 VITALS — BP 132/82 | HR 80 | Ht 69.0 in | Wt 184.0 lb

## 2019-06-22 DIAGNOSIS — I25709 Atherosclerosis of coronary artery bypass graft(s), unspecified, with unspecified angina pectoris: Secondary | ICD-10-CM

## 2019-06-22 DIAGNOSIS — I10 Essential (primary) hypertension: Secondary | ICD-10-CM

## 2019-06-22 DIAGNOSIS — E876 Hypokalemia: Secondary | ICD-10-CM

## 2019-06-22 DIAGNOSIS — E785 Hyperlipidemia, unspecified: Secondary | ICD-10-CM

## 2019-06-22 DIAGNOSIS — I350 Nonrheumatic aortic (valve) stenosis: Secondary | ICD-10-CM | POA: Diagnosis not present

## 2019-06-22 MED ORDER — REPATHA SURECLICK 140 MG/ML ~~LOC~~ SOAJ: 150.0000 mg | SUBCUTANEOUS | 6 refills | Status: DC

## 2019-06-22 NOTE — Patient Instructions (Signed)
Medication Instructions:  Your physician has recommended you make the following change in your medication:  START: Repatha 140 mg per one pen. Inject into the skin every 14 days. *If you need a refill on your cardiac medications before your next appointment, please call your pharmacy*   Lab Work: Your physician recommends that you return for lab work in: 6 weeks Lipids If you have labs (blood work) drawn today and your tests are completely normal, you will receive your results only by: Marland Kitchen MyChart Message (if you have MyChart) OR . A paper copy in the mail If you have any lab test that is abnormal or we need to change your treatment, we will call you to review the results.   Testing/Procedures: None   Follow-Up: At [facility], you and your health needs are our priority.  As part of our continuing mission to provide you with exceptional heart care, we have created designated Provider Care Teams.  These Care Teams include your primary Cardiologist (physician) and Advanced Practice Providers (APPs -  Physician Assistants and Nurse Practitioners) who all work together to provide you with the care you need, when you need it.  We recommend signing up for the patient portal called "MyChart".  Sign up information is provided on this After Visit Summary.  MyChart is used to connect with patients for Virtual Visits (Telemedicine).  Patients are able to view lab/test results, encounter notes, upcoming appointments, etc.  Non-urgent messages can be sent to your provider as well.   To learn more about what you can do with MyChart, go to [email or web address].    Your next appointment:   6 month(s)  The format for your next appointment:   In Person  Provider:   Shirlee More, MD   Other Instructions

## 2019-06-22 NOTE — Telephone Encounter (Signed)
Called and left the patient a detailed message letting her know that per Dr. Bettina Gavia he would like for her to be seen by the lipid clinic prior to starting her Repatha. I let her know that this referral has been placed and they will call her to schedule this appointment. I also gave her our call back number in case she had any  Questions or concerns at this time.

## 2019-07-24 ENCOUNTER — Ambulatory Visit: Payer: Medicare Other

## 2019-07-24 NOTE — Progress Notes (Deleted)
Patient ID: [name]                 DOB: 11-08-1945                    MRN: [id number]     HPI: [name] is a 74 y.o. female patient referred to lipid clinic by Dr. Bettina Gavia. PMH is significant for CAD, HTN, HLD, nonrheumatic aortic valve stenosis, and probably familiar hyperlipidemia.  Patient presents today to lipid clinic PCSK9i Part D?  Current Medications: rosuvastatin 40mg  daily, zetia 10mg  daily Intolerances:  Risk Factors: CAD, HTN (baseline LDL 245) LDL goal: <70  Diet:   Exercise:   Family History:   Social History:   Labs: 04/24/19 TC 188, TG 138, HDL 38, LDL 122  Past Medical History:  Diagnosis Date   CAD (coronary artery disease)    2009 RCA occluded proximally, LAD occluded proximally, left main 90% stenosis, LIMA to the LAD widely patent, SVG to OM and diagonal patent, SVG RCA patent. This stenosis after the anastomosis of 80% in the native vessel. This was treated with PCI.   Collagen vascular disease ([facility])    HTN (hypertension)    Hyperlipidemia     Current Outpatient Medications on File Prior to Visit  Medication Sig Dispense Refill   amLODipine (NORVASC) 10 MG tablet Take 10 mg by mouth daily.     aspirin EC 81 MG tablet Take 81 mg by mouth daily.     Cyanocobalamin (B-12 COMPLIANCE INJECTION IJ) Inject as directed.     escitalopram (LEXAPRO) 10 MG tablet Take 20 mg by mouth daily.     estradiol (ESTRACE) 1 MG tablet estradiol 1 mg tablet  TAKE 1 TABLET BY MOUTH ONCE DAILY     ezetimibe (ZETIA) 10 MG tablet Take 10 mg by mouth every morning.      Omega-3 Fatty Acids (FISH OIL PO) Take 3 capsules by mouth 2 (two) times daily.      oxybutynin (DITROPAN-XL) 5 MG 24 hr tablet Take by mouth.     rosuvastatin (CRESTOR) 40 MG tablet Take 40 mg by mouth every morning.      No current facility-administered medications on file prior to visit.    No Known Allergies  Assessment/Plan:  1. Hyperlipidemia -    Thank you,  Ramond Dial, Pharm.D, BCPS, CPP [place]  [address] [place], [place] [address]  Phone: [phone]; Fax: [phone]

## 2019-08-02 ENCOUNTER — Encounter: Payer: Self-pay | Admitting: General Practice

## 2019-08-03 DIAGNOSIS — C55 Malignant neoplasm of uterus, part unspecified: Secondary | ICD-10-CM

## 2019-08-03 HISTORY — DX: Malignant neoplasm of uterus, part unspecified: C55

## 2019-08-09 ENCOUNTER — Other Ambulatory Visit: Payer: Self-pay | Admitting: Obstetrics and Gynecology

## 2019-08-09 DIAGNOSIS — R928 Other abnormal and inconclusive findings on diagnostic imaging of breast: Secondary | ICD-10-CM

## 2019-08-21 ENCOUNTER — Ambulatory Visit
Admission: RE | Admit: 2019-08-21 | Discharge: 2019-08-21 | Disposition: A | Discharge status: Home / Self Care | Payer: Medicare Other | Source: Ambulatory Visit | Attending: Obstetrics and Gynecology | Admitting: Obstetrics and Gynecology

## 2019-08-21 ENCOUNTER — Other Ambulatory Visit: Payer: Self-pay | Admitting: Obstetrics and Gynecology

## 2019-08-21 ENCOUNTER — Other Ambulatory Visit: Payer: Self-pay

## 2019-08-21 DIAGNOSIS — R928 Other abnormal and inconclusive findings on diagnostic imaging of breast: Secondary | ICD-10-CM

## 2019-08-21 DIAGNOSIS — N6320 Unspecified lump in the left breast, unspecified quadrant: Secondary | ICD-10-CM

## 2019-12-14 DIAGNOSIS — M359 Systemic involvement of connective tissue, unspecified: Secondary | ICD-10-CM | POA: Insufficient documentation

## 2019-12-14 DIAGNOSIS — I2510 Atherosclerotic heart disease of native coronary artery without angina pectoris: Secondary | ICD-10-CM | POA: Insufficient documentation

## 2019-12-14 DIAGNOSIS — E785 Hyperlipidemia, unspecified: Secondary | ICD-10-CM | POA: Insufficient documentation

## 2019-12-14 DIAGNOSIS — I10 Essential (primary) hypertension: Secondary | ICD-10-CM | POA: Insufficient documentation

## 2019-12-27 NOTE — Progress Notes (Signed)
Cardiology Office Note:    Date:  12/28/2019   ID:  PAYTYN MESTA, DOB Mar 28, 1945, MRN [id number]  PCP:  Patient, No Pcp Per  Cardiologist:  Shirlee More, MD    Referring MD: No ref. provider found    ASSESSMENT:    1. Coronary artery disease involving coronary bypass graft of native heart with angina pectoris ([place])   2. Hyperlipidemia, unspecified hyperlipidemia type   3. Essential (primary) hypertension   4. Nonrheumatic aortic valve stenosis    PLAN:    In order of problems listed above:  1. She continues to do well decades from bypass surgery and PCI New York Heart Association class I at this time I would not advise an ischemia evaluation and continue treatment including antiplatelet aspirin antihypertensive amlodipine combined high intensity statin and Zetia in addition of PCSK9 inhibitor. 2. She has significant residual elevation of LDL non-HDL cholesterol on high intensity statin plus Zetia start Repatha 2 months check lipids and LP(a) and decide whether or not to continue Zetia. 3. BP at target continue current treatment 4. Mild plan to repeat echocardiogram after next visit 2-year interval   Next appointment: 6 months   Medication Adjustments/Labs and Tests Ordered: Current medicines are reviewed at length with the patient today.  Concerns regarding medicines are outlined above.  Orders Placed This Encounter  Procedures  . Comprehensive metabolic panel  . Lipid panel  . Lipoprotein A (LPA)  . AMB Referral to [place] ordered this encounter  Medications  . nitroGLYCERIN (NITROSTAT) 0.4 MG SL tablet    Sig: Place 1 tablet (0.4 mg total) under the tongue every 5 (five) minutes as needed for chest pain.    Dispense:  90 tablet    Refill:  3    Chief Complaint  Patient presents with  . Follow-up  . Coronary Artery Disease  . Hyperlipidemia  . Hypertension  . Aortic Stenosis    History of Present Illness:    ELLAMAE LYBECK is a  74 y.o. female with a hx of of coronary artery disease CABG in 1994 subsequent PCI in 2009 aortic stenosis hypertension hyperlipidemia and hypokalemia was last seen 06/22/2019.  An echocardiogram performed June 2020 with mild aortic stenosis with a plan to repeat it in 2 years.  I will address it at her next visit.  At that time mean gradient was 12.8 mmHg.  Ejection fraction was 55 to 60%. Compliance with diet, lifestyle and medications: Yes  She continues to do well no angina dyspnea palpitation or syncope she has residual elevated LDL by and high intensity statin and Zetia with an LDL of 114 total 180 triglyceride 131 HDL 40 11/08/2019.  Her non-HDL cholesterol was quite elevated at 140 and a CMP showed normal liver function test GFR 52 cc she was hypokalemic 3.1 has a prescription at home but has not taken it I encouraged her to begin Past Medical History:  Diagnosis Date  . CAD (coronary artery disease)    2009 RCA occluded proximally, LAD occluded proximally, left main 90% stenosis, LIMA to the LAD widely patent, SVG to OM and diagonal patent, SVG RCA patent. This stenosis after the anastomosis of 80% in the native vessel. This was treated with PCI.  Marland Kitchen Collagen vascular disease ([place])   . Coronary artery disease involving coronary bypass graft of native heart with angina pectoris ([place]) 10/31/2013  . Essential (primary) hypertension 06/17/2012  . HTN (hypertension)   . Hyperlipidemia  Past Surgical History:  Procedure Laterality Date  . ABDOMINAL AORTIC ANEURYSM REPAIR  2003  . ABDOMINAL HYSTERECTOMY N/A 03/26/2014   Procedure: TOTAL HYSTERECTOMY ABDOMINAL;  Surgeon: Everitt Amber, MD;  Location: [facility];  Service: Gynecology;  Laterality: N/A;  . COLON SURGERY  2007   blockage  . CORONARY ARTERY BYPASS GRAFT  1994   LIMA to the LAD, SVG to RCA, SVG sequential to OM and diagonal,  . LAPAROTOMY N/A 03/26/2014   Procedure: EXPLORATORY LAPAROTOMY;  Surgeon: Everitt Amber, MD;  Location: [facility];   Service: Gynecology;  Laterality: N/A;  . LYSIS OF ADHESION  03/26/2014   Procedure: LYSIS OF ADHESION;  Surgeon: Everitt Amber, MD;  [facility]: [facility];  Service: Gynecology;;  . OMENTECTOMY  03/26/2014   Procedure: OMENTECTOMY;  Surgeon: Everitt Amber, MD;  [facility];  Service: Gynecology;;  . SALPINGOOPHORECTOMY Bilateral 03/26/2014   Procedure: BILATERL SALPINGO OOPHORECTOMY;  Surgeon: Everitt Amber, MD;  [facility];  Service: Gynecology;  Laterality: Bilateral;  . TONSILLECTOMY    . VENTRAL HERNIA REPAIR  03/26/2014   Procedure: HERNIA REPAIR VENTRAL ADULT;  Surgeon: Excell Seltzer, MD;  [facility];  Service: General;;  With MESH    Current Medications: Current Meds  Medication Sig  . amLODipine (NORVASC) 10 MG tablet Take 10 mg by mouth daily.  Marland Kitchen aspirin EC 81 MG tablet Take 81 mg by mouth daily.  . Cyanocobalamin (B-12 COMPLIANCE INJECTION IJ) Inject as directed.  . escitalopram (LEXAPRO) 10 MG tablet Take 20 mg by mouth daily.  Marland Kitchen estradiol (ESTRACE) 1 MG tablet estradiol 1 mg tablet  TAKE 1 TABLET BY MOUTH ONCE DAILY  . ezetimibe (ZETIA) 10 MG tablet Take 10 mg by mouth every morning.   . montelukast (SINGULAIR) 10 MG tablet Take 10 mg by mouth daily.  . Omega-3 Fatty Acids (FISH OIL PO) Take 3 capsules by mouth 2 (two) times daily.   Marland Kitchen oxybutynin (DITROPAN-XL) 5 MG 24 hr tablet Take by mouth.  . rosuvastatin (CRESTOR) 40 MG tablet Take 40 mg by mouth every morning.   . Vitamin D, Ergocalciferol, (DRISDOL) 1.25 MG (50000 UNIT) CAPS capsule Take 50,000 Units by mouth once a week.     Allergies:   Patient has no known allergies.   Social History   Socioeconomic History  . Marital status: Married    Spouse name: Not on file  . Number of children: 3  . Years of education: Not on file  . Highest education level: Not on file  Occupational History  . Not on file  Tobacco Use  . Smoking status: Light Tobacco Smoker    Packs/day: 0.25    Types: Cigarettes  .  Smokeless tobacco: Never Used  Vaping Use  . Vaping Use: Never used  Substance and Sexual Activity  . Alcohol use: No    Alcohol/week: 0.0 standard drinks  . Drug use: No  . Sexual activity: Not Currently  Other Topics Concern  . Not on file  Social History Narrative   Lives with husband and 24 year old.     Social Determinants of Health   Financial Resource Strain: Not on file  Food Insecurity: Not on file  Transportation Needs: Not on file  Physical Activity: Not on file  Stress: Not on file  Social Connections: Not on file     Family History: The patient's family history includes CAD (age of onset: 106) in her father; Cancer in her sister; Hypertension in her brother. ROS:  Please see the history of present illness.    All other systems reviewed and are negative.  EKGs/Labs/Other Studies Reviewed:    The following studies were reviewed today:  EKG:  EKG 06/22/2019 showed sinus rhythm with nonspecific ST and T  Recent Labs: No results found for requested labs within last 8760 hours.  Recent Lipid Panel    Component Value Date/Time   TRIG 191 (H) 04/03/2014 0501    Physical Exam:    VS:  BP 132/86   Pulse 80   Ht 5\' 9"  (1.753 m)   Wt 176 lb (79.8 kg)   SpO2 97%   BMI 25.99 kg/m     Wt Readings from Last 3 Encounters:  12/28/19 176 lb (79.8 kg)  06/22/19 184 lb (83.5 kg)  09/12/18 185 lb 9 oz (84.2 kg)     GEN:  Well nourished, well developed in no acute distress HEENT: Normal NECK: No JVD; No carotid bruits LYMPHATICS: No lymphadenopathy CARDIAC: She has a grade 2/6 localized aortic area midsystolic murmur does not radiate to the carotids or encompass S2 and there is no aortic regurgitation RRR, no rubs, gallops RESPIRATORY:  Clear to auscultation without rales, wheezing or rhonchi  ABDOMEN: Soft, non-tender, non-distended MUSCULOSKELETAL:  No edema; No deformity  SKIN: Warm and dry NEUROLOGIC:  Alert and oriented x 3 PSYCHIATRIC:  Normal affect     Signed, Shirlee More, MD  12/28/2019 12:02 PM    [place]

## 2019-12-28 ENCOUNTER — Encounter: Payer: Self-pay | Admitting: Cardiology

## 2019-12-28 ENCOUNTER — Other Ambulatory Visit: Payer: Self-pay

## 2019-12-28 ENCOUNTER — Ambulatory Visit (INDEPENDENT_AMBULATORY_CARE_PROVIDER_SITE_OTHER): Payer: Medicare Other | Admitting: Cardiology

## 2019-12-28 VITALS — BP 132/86 | HR 80 | Ht 69.0 in | Wt 176.0 lb

## 2019-12-28 DIAGNOSIS — I25709 Atherosclerosis of coronary artery bypass graft(s), unspecified, with unspecified angina pectoris: Secondary | ICD-10-CM

## 2019-12-28 DIAGNOSIS — I350 Nonrheumatic aortic (valve) stenosis: Secondary | ICD-10-CM | POA: Diagnosis not present

## 2019-12-28 DIAGNOSIS — E785 Hyperlipidemia, unspecified: Secondary | ICD-10-CM

## 2019-12-28 DIAGNOSIS — I10 Essential (primary) hypertension: Secondary | ICD-10-CM

## 2019-12-28 MED ORDER — NITROGLYCERIN 0.4 MG SL SUBL: 0.4000 mg | SUBLINGUAL_TABLET | SUBLINGUAL | 3 refills | Status: DC | PRN

## 2019-12-28 NOTE — Patient Instructions (Signed)
Medication Instructions:  Your physician has recommended you make the following change in your medication:  START: Nitroglycerin 0.4 mg take one tablet by mouth every 5 minutes as needed for chest pain up to three times.  Please take your potassium that you have at home daily.  *If you need a refill on your cardiac medications before your next appointment, please call your pharmacy*   Lab Work: Your physician recommends that you return for lab work in: 2 months CMP, Lipids, LPA If you have labs (blood work) drawn today and your tests are completely normal, you will receive your results only by:  [place] (if you have [place]) OR  A paper copy in the mail If you have any lab test that is abnormal or we need to change your treatment, we will call you to review the results.   Testing/Procedures: None   Follow-Up: At [facility], you and your health needs are our priority.  As part of our continuing mission to provide you with exceptional heart care, we have created designated Provider Care Teams.  These Care Teams include your primary Cardiologist (physician) and Advanced Practice Providers (APPs -  Physician Assistants and Nurse Practitioners) who all work together to provide you with the care you need, when you need it.  We recommend signing up for the patient portal called "MyChart".  Sign up information is provided on this After Visit Summary.  MyChart is used to connect with patients for Virtual Visits (Telemedicine).  Patients are able to view lab/test results, encounter notes, upcoming appointments, etc.  Non-urgent messages can be sent to your provider as well.   To learn more about what you can do with MyChart, go to [email or web address].    Your next appointment:   6 month(s)  The format for your next appointment:   In Person  Provider:   Shirlee More, MD   Other Instructions

## 2020-01-06 NOTE — Progress Notes (Signed)
Patient ID: JANYTH RIERA                 DOB: 21-Jul-1945                    MRN: [id number]     HPI: [name] is a 74 y.o. female patient referred to lipid clinic by Dr. Bettina Gavia. PMH is significant for coronary artery disease, CABG in 1994 subsequent PCI in 2009, aortic stenosis, hypertension, and hyperlipidemia.   Patient presents today in good spirits. Reports medication adherence and no side effects with rosuvastatin and ezetimibe. Denies myalgias. Reports not having medicare part D prescription coverage. Discussed diet and exercise (see below).  Current Medications: rosuvastatin 40 mg daily, ezetimibe 10 mg daily, fish oil supplement - 3 capsules BID Intolerances: none Risk Factors: CAD, multiple stents, tobacco smoker, HTN, HLD LDL goal: <55 mg/dL  Diet:  -Breakfast: usually skips, toast and tea -Lunch: soup, sandwich -Dinner: baked salmon, chicken pie, hot dog, fast food very seldomly (chick-fil-a) -Snacks: crackers  -Drinks: pepsi, root beer  Exercise: none   Social History: light tobacco smoker - 0.25 ppd  Labs: 11/08/19: LDL 114, TC 180, TG 131, HDL 41 (rosuvastatin 40 mg and ezetimibe 10 mg)  Past Medical History:  Diagnosis Date  . CAD (coronary artery disease)    2009 RCA occluded proximally, LAD occluded proximally, left main 90% stenosis, LIMA to the LAD widely patent, SVG to OM and diagonal patent, SVG RCA patent. This stenosis after the anastomosis of 80% in the native vessel. This was treated with PCI.  Marland Kitchen Collagen vascular disease ([place])   . Coronary artery disease involving coronary bypass graft of native heart with angina pectoris ([place]) 10/31/2013  . Essential (primary) hypertension 06/17/2012  . HTN (hypertension)   . Hyperlipidemia     Current Outpatient Medications on File Prior to Visit  Medication Sig Dispense Refill  . amLODipine (NORVASC) 10 MG tablet Take 10 mg by mouth daily.    Marland Kitchen aspirin EC 81 MG tablet Take 81 mg by mouth daily.    .  Cyanocobalamin (B-12 COMPLIANCE INJECTION IJ) Inject as directed.    . escitalopram (LEXAPRO) 10 MG tablet Take 20 mg by mouth daily.    Marland Kitchen estradiol (ESTRACE) 1 MG tablet estradiol 1 mg tablet  TAKE 1 TABLET BY MOUTH ONCE DAILY    . ezetimibe (ZETIA) 10 MG tablet Take 10 mg by mouth every morning.     . montelukast (SINGULAIR) 10 MG tablet Take 10 mg by mouth daily.    . nitroGLYCERIN (NITROSTAT) 0.4 MG SL tablet Place 1 tablet (0.4 mg total) under the tongue every 5 (five) minutes as needed for chest pain. 90 tablet 3  . Omega-3 Fatty Acids (FISH OIL PO) Take 3 capsules by mouth 2 (two) times daily.     Marland Kitchen oxybutynin (DITROPAN-XL) 5 MG 24 hr tablet Take by mouth.    . rosuvastatin (CRESTOR) 40 MG tablet Take 40 mg by mouth every morning.     . Vitamin D, Ergocalciferol, (DRISDOL) 1.25 MG (50000 UNIT) CAPS capsule Take 50,000 Units by mouth once a week.     No current facility-administered medications on file prior to visit.    No Known Allergies  Assessment/Plan:  1. Hyperlipidemia - LDL above goal <55 mg/dL due to progressive CAD. Medication adherence appears optimal and denies myalgia with rosuvastatin and ezetimibe. Discussed Repatha's clinical benenfits and potential side effects, and demonstrated proper injection technique with teach back method.  Patient with no prescription coverage, therefore filled out [employer] patient assistance application for Repatha 140 mg subcutaneously every 2 weeks. Form faxed today. Will contact patient once application is approved. Continued rosuvastatin 40 mg daily and ezetimibe 10 mg daily. Encouraged patient to aim for a diet full of vegetables, fruit and lean meats (chicken, [place], fish) and to limit carbs (bread, pasta, sugar, rice) and red meat consumption. Encouraged patient to increase exercise activity to 20-30 minutes daily with the goal of 150 minutes per week. Patient verbalized understanding. Follow-up fasting lipid panel, LFTs and lipoprotein scheduled  in 3 months.   Fabio Neighbors, PharmD, BCPS PGY2 Ambulatory Care Pharmacy Resident [facility] [address], [place], [place] [address] Phone: [phone]; Fax: [phone]

## 2020-01-08 ENCOUNTER — Ambulatory Visit (INDEPENDENT_AMBULATORY_CARE_PROVIDER_SITE_OTHER): Payer: Medicare Other | Admitting: Pharmacist

## 2020-01-08 ENCOUNTER — Other Ambulatory Visit: Payer: Self-pay

## 2020-01-08 DIAGNOSIS — I25709 Atherosclerosis of coronary artery bypass graft(s), unspecified, with unspecified angina pectoris: Secondary | ICD-10-CM | POA: Diagnosis not present

## 2020-01-08 DIAGNOSIS — E785 Hyperlipidemia, unspecified: Secondary | ICD-10-CM | POA: Diagnosis not present

## 2020-01-08 NOTE — Patient Instructions (Addendum)
Nice to see you today!  Keep up the good work with diet and exercise. Aim for a diet full of vegetables, fruit and lean meats (chicken, [place], fish). Try to limit carbs (bread, pasta, sugar, rice) and red meat consumption.  Your goal LDL is <55 mg/dL, you're currently at 867 mg/dL  Medication Changes: Once patient assistance application is approved, you will receive a shipment of 12 weeks worth of Repatha 140 mg every 2 weeks. We will give you a call once approved  You will inject Repatha into your fatty lower abdomen area every 2 weeks.   Please give [place] a call at [phone] with any questions or concerns.  For PCSK9i, inject once every other week (any day of the week that works for you) into the fatty skin of stomach, upper outer thigh or back of the arm. Clean the site with soap and warm water or an alcohol pad. Keep the medication in the fridge until you are ready to give your dose, then take it out and let warm up to room temperature for 30-60 mins.

## 2020-02-05 ENCOUNTER — Telehealth: Payer: Self-pay | Admitting: Pharmacist

## 2020-02-05 NOTE — Telephone Encounter (Signed)
Contacted [employer] patient assistance to inquire about pt's application status. Kept on hold for >3 hours with an additional wait time of >8 hours. Provided our contact information to their automated system and requested call back from a representative asap. Will follow-up in 1 week.

## 2020-02-05 NOTE — Telephone Encounter (Signed)
[employer] representative finally called back and stated that pt's application has not be processed yet. Representative stated Repatha applications started processing 01/15/20 and will be processed in the time received. Representative stated provider and patient will be contacted once approved.   Will follow-up in 1 week on application status.

## 2020-02-20 NOTE — Telephone Encounter (Signed)
Received call back from [employer], her [place] assistance is still being processed, but it's at last stage of the final review. Hopefully will have a determination soon.

## 2020-02-25 ENCOUNTER — Other Ambulatory Visit: Payer: Medicare Other

## 2020-02-26 ENCOUNTER — Telehealth: Payer: Self-pay | Admitting: Pharmacist

## 2020-02-26 NOTE — Telephone Encounter (Signed)
Still have not heard back on Repatha pt assistance despite submitting this to [place] 01/08/20.  Called for status update and was on hold for 3 HOURS and was still not able to speak to anyone. Had to end call due to end of work day. Sent fax cover letter with request to expedite case due to excessive wait time for coverage decision.

## 2020-02-27 ENCOUNTER — Other Ambulatory Visit: Payer: Medicare Other

## 2020-02-29 ENCOUNTER — Telehealth: Payer: Self-pay | Admitting: Pharmacist

## 2020-02-29 NOTE — Telephone Encounter (Signed)
Received approval for Repatha from [place] through 01/10/21 but was advised that pt will need to add a Medicare Part D plan next year since she qualifies for one.   Called pt to notify her of approval and advised her to call #[phone] to coordinate shipment. Moved out lab work to May to assess efficacy. Provided pt with our direct # if she has any issues with her injections. Also advised her to sign up for Part D plan for 2023 year.  Pt was appreciative for assistance and verbalized understanding with treatment plan.

## 2020-03-04 ENCOUNTER — Telehealth: Payer: Self-pay | Admitting: Pharmacist

## 2020-03-04 NOTE — Telephone Encounter (Signed)
Patient called with questions regarding lipid medications since she was approved for Repatha.  Recommended she remain on her rosuvastatin and Zetia while on Repatha until her follow up lipid panel when a decision can be made if she is to remain on them.  Patient voiced understanding.

## 2020-03-24 ENCOUNTER — Other Ambulatory Visit: Payer: Medicare Other

## 2020-04-10 ENCOUNTER — Ambulatory Visit
Admission: RE | Admit: 2020-04-10 | Discharge: 2020-04-10 | Disposition: A | Discharge status: Home / Self Care | Payer: Medicare Other | Source: Ambulatory Visit | Attending: Obstetrics and Gynecology | Admitting: Obstetrics and Gynecology

## 2020-04-10 ENCOUNTER — Other Ambulatory Visit: Payer: Self-pay

## 2020-04-10 DIAGNOSIS — N6320 Unspecified lump in the left breast, unspecified quadrant: Secondary | ICD-10-CM

## 2020-05-13 ENCOUNTER — Other Ambulatory Visit: Payer: Medicare Other

## 2020-05-22 DIAGNOSIS — N1831 Chronic kidney disease, stage 3a: Secondary | ICD-10-CM

## 2020-05-22 DIAGNOSIS — F3342 Major depressive disorder, recurrent, in full remission: Secondary | ICD-10-CM

## 2020-05-22 HISTORY — DX: Chronic kidney disease, stage 3a: N18.31

## 2020-05-22 HISTORY — DX: Major depressive disorder, recurrent, in full remission: F33.42

## 2020-05-26 ENCOUNTER — Telehealth: Payer: Self-pay | Admitting: Cardiology

## 2020-05-26 NOTE — Telephone Encounter (Signed)
Left message on patients voicemail to let her know Dr. Joya Gaskins comments and I also gave her our call back number to call back with any questions or concerns.

## 2020-05-26 NOTE — Telephone Encounter (Signed)
New message:     Patient stating that she had her LIPID done with her PCP doctors and would like to know if those results can be used

## 2020-06-18 NOTE — Progress Notes (Signed)
Cardiology Office Note:    Date:  06/19/2020   ID:  [name], DOB Apr 10, 1945, MRN [id number]  PCP:  [facility]  Cardiologist:  Shirlee More, MD    Referring MD: No ref. provider found    ASSESSMENT:    1. Coronary artery disease involving coronary bypass graft of native heart with angina pectoris ([place])   2. Nonrheumatic aortic valve stenosis   3. Essential (primary) hypertension   4. Mixed hyperlipidemia    PLAN:    In order of problems listed above:  Stable CAD continue medical treatment low-dose aspirin and combined lipid-lowering therapy PCSK9 inhibitor and statin.  New York Heart Association class I.  At this time I will think she requires an ischemia evaluation Recheck echocardiogram she may have progressed to moderate stenosis by exam Stable continue treatment calcium channel blocker Good result discontinue Zetia continue high intensity statin and Repatha She asked my opinion I think she could take Adderall if necessary   Next appointment: 1 year   Medication Adjustments/Labs and Tests Ordered: Current medicines are reviewed at length with the patient today.  Concerns regarding medicines are outlined above.  No orders of the defined types were placed in this encounter.  No orders of the defined types were placed in this encounter.   Chief Complaint  Patient presents with   Follow-up    History of Present Illness:    [name] is a 75 y.o. female with a hx of disease with CABG 1994 PCI in 2009 hypertension hyperlipidemia hypokalemia and aortic stenosis last seen 12/28/1998.  Echocardiogram June 2020 showed mild aortic stenosis mean gradient 13 mmHg EF 55 to 60%.  Compliance with diet, lifestyle and medications: Yes  She is very pleased with results of combined lipid-lowering therapy I agree with her PCP and she will discontinue Zetia. She will reduce her aspirin coated 81 mg daily to avoid GI bleed. She has had no  angina dyspnea shortness of breath palpitation or syncope. She tolerates her statin without muscle pain or weakness  Recent labs [facility] 05/22/2020: A1c 5.5% Cholesterol 120 LDL 52 triglycerides 111 HDL 46 CMP normal except creatinine 1.34 GFR 42 cc/min she is presently taking high intensity statin rosuvastatin Zetia and Repatha. Past Medical History:  Diagnosis Date   CAD (coronary artery disease)    2009 RCA occluded proximally, LAD occluded proximally, left main 90% stenosis, LIMA to the LAD widely patent, SVG to OM and diagonal patent, SVG RCA patent. This stenosis after the anastomosis of 80% in the native vessel. This was treated with PCI.   Collagen vascular disease ([place])    Coronary artery disease involving coronary bypass graft of native heart with angina pectoris ([place]) 10/31/2013   Essential (primary) hypertension 06/17/2012   HTN (hypertension)    Hyperlipidemia     Past Surgical History:  Procedure Laterality Date   ABDOMINAL AORTIC ANEURYSM REPAIR  2003   ABDOMINAL HYSTERECTOMY N/A 03/26/2014   Procedure: TOTAL HYSTERECTOMY ABDOMINAL;  Surgeon: Allison Amber, MD;  Location: [facility];  Service: Gynecology;  Laterality: N/A;   COLON SURGERY  2007   blockage   CORONARY ARTERY BYPASS GRAFT  1994   LIMA to the LAD, SVG to RCA, SVG sequential to OM and diagonal,   LAPAROTOMY N/A 03/26/2014   Procedure: EXPLORATORY LAPAROTOMY;  Surgeon: Allison Amber, MD;  Location: [facility];  Service: Gynecology;  Laterality: N/A;   LYSIS OF ADHESION  03/26/2014   Procedure: LYSIS  OF ADHESION;  Surgeon: Allison Amber, MD;  Location: [facility];  Service: Gynecology;;   OMENTECTOMY  03/26/2014   Procedure: OMENTECTOMY;  Surgeon: Allison Amber, MD;  Location: [facility];  Service: Gynecology;;   SALPINGOOPHORECTOMY Bilateral 03/26/2014   Procedure: BILATERL SALPINGO OOPHORECTOMY;  Surgeon: Allison Amber, MD;  Location: [facility];  Service: Gynecology;  Laterality: Bilateral;   TONSILLECTOMY     VENTRAL HERNIA REPAIR   03/26/2014   Procedure: HERNIA REPAIR VENTRAL ADULT;  Surgeon: Excell Seltzer, MD;  Location: [facility];  Service: General;;  With MESH    Current Medications: Current Meds  Medication Sig   amLODipine (NORVASC) 10 MG tablet Take 10 mg by mouth daily.   aspirin 325 MG tablet Take 325 mg by mouth as needed for headache.   escitalopram (LEXAPRO) 10 MG tablet Take 20 mg by mouth daily.   estradiol (ESTRACE) 1 MG tablet estradiol 1 mg tablet  TAKE 1 TABLET BY MOUTH ONCE DAILY   Evolocumab (REPATHA SURECLICK) 801 MG/ML SOAJ Inject 1 pen into the skin every 14 (fourteen) days.   ezetimibe (ZETIA) 10 MG tablet Take 10 mg by mouth every morning.    montelukast (SINGULAIR) 10 MG tablet Take 10 mg by mouth daily.   nitroGLYCERIN (NITROSTAT) 0.4 MG SL tablet Place 1 tablet (0.4 mg total) under the tongue every 5 (five) minutes as needed for chest pain.   Omega-3 Fatty Acids (FISH OIL PO) Take 3 capsules by mouth 2 (two) times daily.    oxybutynin (DITROPAN-XL) 5 MG 24 hr tablet Take by mouth.   rosuvastatin (CRESTOR) 40 MG tablet Take 40 mg by mouth every morning.    vitamin B-12 (CYANOCOBALAMIN) 1000 MCG tablet Take 1,000 mcg by mouth daily.   Vitamin D, Ergocalciferol, (DRISDOL) 1.25 MG (50000 UNIT) CAPS capsule Take 50,000 Units by mouth once a week.     Allergies:   Patient has no known allergies.   Social History   Socioeconomic History   Marital status: Married    Spouse name: Not on file   Number of children: 3   Years of education: Not on file   Highest education level: Not on file  Occupational History   Not on file  Tobacco Use   Smoking status: Former    Packs/day: 0.25    Pack years: 0.00    Types: Cigarettes   Smokeless tobacco: Never  Vaping Use   Vaping Use: Never used  Substance and Sexual Activity   Alcohol use: No    Alcohol/week: 0.0 standard drinks   Drug use: No   Sexual activity: Not Currently  Other Topics Concern   Not on file  Social History Narrative    Lives with husband and 55 year old.     Social Determinants of Health   Financial Resource Strain: Not on file  Food Insecurity: Not on file  Transportation Needs: Not on file  Physical Activity: Not on file  Stress: Not on file  Social Connections: Not on file     Family History: The patient's family history includes CAD (age of onset: 79) in her father; Cancer in her sister; Hypertension in her brother. ROS:   Please see the history of present illness.    All other systems reviewed and are negative.  EKGs/Labs/Other Studies Reviewed:    The following studies were reviewed today:  EKG:  EKG ordered today and personally reviewed.  The ekg ordered today demonstrates sinus rhythm T ST and T abnormality diffusely unchanged June 2021  Physical Exam:    VS:  BP 120/90 (BP Location: Left Arm, Patient Position: Sitting, Cuff Size: Normal)   Pulse 77   Ht 5\' 9"  (1.753 m)   Wt 172 lb (78 kg)   SpO2 92%   BMI 25.40 kg/m     Wt Readings from Last 3 Encounters:  06/19/20 172 lb (78 kg)  12/28/19 176 lb (79.8 kg)  06/22/19 184 lb (83.5 kg)     GEN:  Well nourished, well developed in no acute distress HEENT: Normal NECK: No JVD; No carotid bruits LYMPHATICS: No lymphadenopathy CARDIAC: Grade 2/6 harsh systolic murmur radiates to the right clavicular area carotids are normal no radiation S2 is normal no aortic regurgitation RRR, no rubs, gallops RESPIRATORY:  Clear to auscultation without rales, wheezing or rhonchi  ABDOMEN: Soft, non-tender, non-distended MUSCULOSKELETAL:  No edema; No deformity  SKIN: Warm and dry NEUROLOGIC:  Alert and oriented x 3 PSYCHIATRIC:  Normal affect    Signed, Shirlee More, MD  06/19/2020 2:13 PM    [facility]

## 2020-06-19 ENCOUNTER — Other Ambulatory Visit: Payer: Self-pay

## 2020-06-19 ENCOUNTER — Ambulatory Visit (INDEPENDENT_AMBULATORY_CARE_PROVIDER_SITE_OTHER): Payer: Medicare Other | Admitting: Cardiology

## 2020-06-19 ENCOUNTER — Encounter: Payer: Self-pay | Admitting: Cardiology

## 2020-06-19 VITALS — BP 120/90 | HR 77 | Ht 69.0 in | Wt 172.0 lb

## 2020-06-19 DIAGNOSIS — I350 Nonrheumatic aortic (valve) stenosis: Secondary | ICD-10-CM

## 2020-06-19 DIAGNOSIS — I10 Essential (primary) hypertension: Secondary | ICD-10-CM

## 2020-06-19 DIAGNOSIS — I25709 Atherosclerosis of coronary artery bypass graft(s), unspecified, with unspecified angina pectoris: Secondary | ICD-10-CM

## 2020-06-19 DIAGNOSIS — E782 Mixed hyperlipidemia: Secondary | ICD-10-CM

## 2020-06-19 MED ORDER — ASPIRIN EC 81 MG PO TBEC: 81.0000 mg | DELAYED_RELEASE_TABLET | Freq: Every day | ORAL | 3 refills | Status: AC

## 2020-06-19 NOTE — Patient Instructions (Signed)
Medication Instructions:  Your physician has recommended you make the following change in your medication: STOP: Zetia START: Aspirin 81 mg take one tablet by mouth daily.  *If you need a refill on your cardiac medications before your next appointment, please call your pharmacy*   Lab Work: None If you have labs (blood work) drawn today and your tests are completely normal, you will receive your results only by: [place] (if you have MyChart) OR A paper copy in the mail If you have any lab test that is abnormal or we need to change your treatment, we will call you to review the results.   Testing/Procedures: Your physician has requested that you have an echocardiogram. Echocardiography is a painless test that uses sound waves to create images of your heart. It provides your doctor with information about the size and shape of your heart and how well your heart's chambers and valves are working. This procedure takes approximately one hour. There are no restrictions for this procedure.    Follow-Up: At [facility], you and your health needs are our priority.  As part of our continuing mission to provide you with exceptional heart care, we have created designated Provider Care Teams.  These Care Teams include your primary Cardiologist (physician) and Advanced Practice Providers (APPs -  Physician Assistants and Nurse Practitioners) who all work together to provide you with the care you need, when you need it.  We recommend signing up for the patient portal called "MyChart".  Sign up information is provided on this After Visit Summary.  MyChart is used to connect with patients for Virtual Visits (Telemedicine).  Patients are able to view lab/test results, encounter notes, upcoming appointments, etc.  Non-urgent messages can be sent to your provider as well.   To learn more about what you can do with MyChart, go to [email or web address].    Your next appointment:   1 year(s)  The  format for your next appointment:   In Person  Provider:   Shirlee More, MD   Other Instructions

## 2020-07-16 ENCOUNTER — Telehealth: Payer: Self-pay

## 2020-07-16 ENCOUNTER — Other Ambulatory Visit: Payer: Self-pay

## 2020-07-16 ENCOUNTER — Ambulatory Visit (HOSPITAL_COMMUNITY): Payer: Medicare Other | Attending: Cardiovascular Disease

## 2020-07-16 DIAGNOSIS — I25709 Atherosclerosis of coronary artery bypass graft(s), unspecified, with unspecified angina pectoris: Secondary | ICD-10-CM | POA: Insufficient documentation

## 2020-07-16 DIAGNOSIS — E782 Mixed hyperlipidemia: Secondary | ICD-10-CM | POA: Insufficient documentation

## 2020-07-16 DIAGNOSIS — I10 Essential (primary) hypertension: Secondary | ICD-10-CM | POA: Insufficient documentation

## 2020-07-16 DIAGNOSIS — I350 Nonrheumatic aortic (valve) stenosis: Secondary | ICD-10-CM | POA: Diagnosis present

## 2020-07-16 LAB — ECHOCARDIOGRAM COMPLETE
AR max vel: 1.41 cm2
AV Area VTI: 1.42 cm2
AV Area mean vel: 1.43 cm2
AV Mean grad: 16 mmHg
AV Peak grad: 33.6 mmHg
Ao pk vel: 2.9 m/s
Area-P 1/2: 3.09 cm2
S' Lateral: 3.2 cm

## 2020-07-16 NOTE — Telephone Encounter (Signed)
Left message on patients voicemail to please return our call.   

## 2020-07-16 NOTE — Telephone Encounter (Signed)
-----   Message from Richardo Priest, MD sent at 07/16/2020  4:58 PM EDT ----- Good results there has been no significant progression of her aortic valve stenosis or blockage.

## 2020-07-17 ENCOUNTER — Telehealth: Payer: Self-pay

## 2020-07-17 NOTE — Telephone Encounter (Signed)
-----   Message from Richardo Priest, MD sent at 07/16/2020  4:58 PM EDT ----- Good results there has been no significant progression of her aortic valve stenosis or blockage.

## 2020-07-17 NOTE — Telephone Encounter (Signed)
Left message on patients voicemail to please return our call.   

## 2020-07-18 ENCOUNTER — Telehealth: Payer: Self-pay

## 2020-07-18 NOTE — Telephone Encounter (Signed)
-----   Message from Richardo Priest, MD sent at 07/16/2020  4:58 PM EDT ----- Good results there has been no significant progression of her aortic valve stenosis or blockage.

## 2020-07-18 NOTE — Telephone Encounter (Signed)
Left message on patients voicemail to please return our call.   Letter mailed to the patient at this time.

## 2020-07-23 ENCOUNTER — Institutional Professional Consult (permissible substitution): Payer: Medicare Other | Admitting: Neurology

## 2020-09-18 ENCOUNTER — Other Ambulatory Visit: Payer: Self-pay | Admitting: Obstetrics and Gynecology

## 2020-09-18 DIAGNOSIS — R928 Other abnormal and inconclusive findings on diagnostic imaging of breast: Secondary | ICD-10-CM

## 2020-10-06 ENCOUNTER — Other Ambulatory Visit: Payer: Medicare Other

## 2020-10-22 ENCOUNTER — Other Ambulatory Visit: Payer: Self-pay

## 2020-10-22 ENCOUNTER — Ambulatory Visit
Admission: RE | Admit: 2020-10-22 | Discharge: 2020-10-22 | Disposition: A | Discharge status: Home / Self Care | Payer: Medicare Other | Source: Ambulatory Visit | Attending: Obstetrics and Gynecology | Admitting: Obstetrics and Gynecology

## 2020-10-22 ENCOUNTER — Ambulatory Visit: Payer: Medicare Other

## 2020-10-22 DIAGNOSIS — R928 Other abnormal and inconclusive findings on diagnostic imaging of breast: Secondary | ICD-10-CM

## 2020-12-30 ENCOUNTER — Telehealth: Payer: Self-pay

## 2020-12-30 NOTE — Telephone Encounter (Signed)
Called and spoke w/pharmacy and they stated that she has no part d. She does have medicare and supplement and could qualify for leqvio. I will route to megan supple to make her aware.

## 2020-12-31 NOTE — Telephone Encounter (Signed)
Pt currently receiving Repatha for free through the [employer] as she does not have prescription insurance. They only cover pts for 1 year and then advise pt will need to add a Medicare Part D plan for the following year.   Called pt and left message to discuss. If she is not planning on getting a Part D plan next year, will discuss changing her to [facility] as she has a Medicare supplement which typically covers Leqvio very well. Would need to send out benefit investigation form for pt to sign if she is interested. Will await call back from pt.

## 2021-01-02 NOTE — Telephone Encounter (Signed)
Called pt again. States she did sign up for a [employer] Part D plan for next year. Doesn't have her specific card info yet and insurance won't be active until January 1. I'll call her in January to get updated insurance info and submit prior authorization request for Repatha. She is ok with ~$45-50 copay per month. If she has a high deductible or copay is higher than anticipated, can either pursue [employer] or can discuss Leqvio.

## 2021-01-14 NOTE — Telephone Encounter (Signed)
Called pt and they stated that they haven't received their new card as of yet and that they will call us when they get their card.

## 2021-01-29 ENCOUNTER — Telehealth: Payer: Self-pay

## 2021-01-29 NOTE — Telephone Encounter (Signed)
-----   Message from Leeroy Bock, [place] sent at 01/29/2021  9:24 AM EST ----- Are you able to call her again to see if the new card came in? She hasn't come by yet but should hopefully have her new card by this point ----- Message ----- From: Allean Found, CMA Sent: 01/14/2021   9:13 AM EST To: Leeroy Bock, RPH-CPP  Called pt and they stated that they haven't received a new card yet but when they do they will bring by the heartcare office  ----- Message ----- From: Leeroy Bock, RPH-CPP Sent: 01/14/2021   7:23 AM EST To: Allean Found, CMA  Please call pt to get updated 2023 insurance, will need prior authorization for Repatha (had been getting from [employer] but can submit as new start, Dec labs show pt off therapy). Will need to check on copay and can get pt in [place] if needed

## 2021-01-29 NOTE — Telephone Encounter (Signed)
Unable to lmom for insurance card

## 2021-02-05 MED ORDER — REPATHA SURECLICK 140 MG/ML ~~LOC~~ SOAJ: 1.0000 | SUBCUTANEOUS | 11 refills | Status: DC

## 2021-02-05 NOTE — Addendum Note (Signed)
Addended by: Allean Found on: 02/05/2021 04:44 PM   Modules accepted: Orders

## 2021-02-05 NOTE — Telephone Encounter (Signed)
[employer]   Customer ID# [id number]   RX BIN # [id number]   RX PCN # Lars Mage GROUP: [place] # [id number]   Customer Service: [phone]

## 2021-02-05 NOTE — Telephone Encounter (Signed)
Called and spoke w/pt and stated that the repatha was approved, rx snt, instructed the pt to call back if unaffordable. Pt voiced understanding

## 2021-02-05 NOTE — Telephone Encounter (Signed)
Pa submitted for repatha: Kerri-Anne Novack (Key: [id number])

## 2021-02-06 ENCOUNTER — Telehealth: Payer: Self-pay

## 2021-02-06 NOTE — Telephone Encounter (Signed)
done

## 2021-02-06 NOTE — Telephone Encounter (Signed)
Patient called and left message 1/27 informing [name] that her Repatha will cost $ 45.00.    She said that if you need to speak to her, please give her a call.

## 2021-02-10 NOTE — Telephone Encounter (Signed)
Unclear from message if this means pt picked up rx and was just letting us know the copay or if it was unaffordable. Please call pt to confirm she picked up Repatha and has started on therapy.

## 2021-02-10 NOTE — Telephone Encounter (Signed)
Lmom pt to call back and let me know if the medication was affordable

## 2021-03-26 ENCOUNTER — Other Ambulatory Visit: Payer: Self-pay | Admitting: Cardiology

## 2021-07-31 DIAGNOSIS — E538 Deficiency of other specified B group vitamins: Secondary | ICD-10-CM | POA: Insufficient documentation

## 2021-07-31 HISTORY — DX: Deficiency of other specified B group vitamins: E53.8

## 2021-08-19 ENCOUNTER — Other Ambulatory Visit: Payer: Self-pay

## 2021-08-20 ENCOUNTER — Encounter: Payer: Self-pay | Admitting: Cardiology

## 2021-08-20 ENCOUNTER — Ambulatory Visit (INDEPENDENT_AMBULATORY_CARE_PROVIDER_SITE_OTHER): Payer: Medicare Other | Admitting: Cardiology

## 2021-08-20 VITALS — BP 120/74 | HR 84 | Ht 69.0 in | Wt 170.1 lb

## 2021-08-20 DIAGNOSIS — I25709 Atherosclerosis of coronary artery bypass graft(s), unspecified, with unspecified angina pectoris: Secondary | ICD-10-CM | POA: Diagnosis not present

## 2021-08-20 DIAGNOSIS — I350 Nonrheumatic aortic (valve) stenosis: Secondary | ICD-10-CM

## 2021-08-20 DIAGNOSIS — I10 Essential (primary) hypertension: Secondary | ICD-10-CM | POA: Diagnosis not present

## 2021-08-20 DIAGNOSIS — E782 Mixed hyperlipidemia: Secondary | ICD-10-CM | POA: Diagnosis not present

## 2021-08-20 MED ORDER — NITROGLYCERIN 0.4 MG SL SUBL: 0.4000 mg | SUBLINGUAL_TABLET | SUBLINGUAL | 1 refills | Status: DC | PRN

## 2021-08-20 NOTE — Patient Instructions (Addendum)
Medication Instructions:  Your physician recommends that you continue on your current medications as directed. Please refer to the Current Medication list given to you today.  *If you need a refill on your cardiac medications before your next appointment, please call your pharmacy*   Lab Work: None If you have labs (blood work) drawn today and your tests are completely normal, you will receive your results only by: [place] (if you have MyChart) OR A paper copy in the mail If you have any lab test that is abnormal or we need to change your treatment, we will call you to review the results.   Testing/Procedures: Your physician has requested that you have an echocardiogram in 1 year. Echocardiography is a painless test that uses sound waves to create images of your heart. It provides your doctor with information about the size and shape of your heart and how well your heart's chambers and valves are working. This procedure takes approximately one hour. There are no restrictions for this procedure.    Follow-Up: At [facility], you and your health needs are our priority.  As part of our continuing mission to provide you with exceptional heart care, we have created designated Provider Care Teams.  These Care Teams include your primary Cardiologist (physician) and Advanced Practice Providers (APPs -  Physician Assistants and Nurse Practitioners) who all work together to provide you with the care you need, when you need it.  We recommend signing up for the patient portal called "MyChart".  Sign up information is provided on this After Visit Summary.  MyChart is used to connect with patients for Virtual Visits (Telemedicine).  Patients are able to view lab/test results, encounter notes, upcoming appointments, etc.  Non-urgent messages can be sent to your provider as well.   To learn more about what you can do with MyChart, go to [email or web address].    Your next appointment:   1  year(s)  The format for your next appointment:   In Person  Provider:   Shirlee More, MD    Other Instructions None  Important Information About [place]

## 2021-08-20 NOTE — Progress Notes (Signed)
Cardiology Office Note:    Date:  08/20/2021   ID:  [name], DOB [birth year], MRN [id number]  PCP:  [facility]  Cardiologist:  Shirlee More, MD    Referring MD: [facility]*    ASSESSMENT:    1. Coronary artery disease involving coronary bypass graft of native heart with angina pectoris ([place])   2. Nonrheumatic aortic valve stenosis   3. Essential (primary) hypertension   4. Mixed hyperlipidemia    PLAN:    In order of problems listed above:  Stable CAD having no angina following previous PCI and on current medical treatment we will continue aspirin calcium channel blocker and combined lipid-lowering rosuvastatin and Repatha. Recheck her echocardiogram I told her that in all likelihood her lipid-lowering therapy will slow progression of aortic stenosis Stable continue treatment her calcium channel blocker Excellent result effective LDL at target continue current treatment   Next appointment: 1 year   Medication Adjustments/Labs and Tests Ordered: Current medicines are reviewed at length with the patient today.  Concerns regarding medicines are outlined above.  Orders Placed This Encounter  Procedures   EKG 12-Lead   ECHOCARDIOGRAM COMPLETE   Meds ordered this encounter  Medications   nitroGLYCERIN (NITROSTAT) 0.4 MG SL tablet    Sig: Place 1 tablet (0.4 mg total) under the tongue every 5 (five) minutes as needed for chest pain.    Dispense:  25 tablet    Refill:  1    Chief Complaint  Patient presents with   Follow-up   Coronary Artery Disease    History of Present Illness:    [name] is a 76 y.o. female with a hx of coronary artery disease with PCI and hypertension hyperlipidemia hypokalemia and aortic stenosis. Compliance with diet, lifestyle and medications: Yes  She had an echocardiogram performed 07/16/2020 Left ventricle normal size wall thickness EF 60 to 65% and grade 1 diastolic dysfunction.   Right ventricle normal in size function systolic pressure.  Aortic stenosis was present mean gradient of 16 mmHg valve area 1.4 cm mild to moderate stenosis.  She is very enthusiastic about her response to lipid-lowering therapy recent LDL 54 HDL 46 cholesterol 117 on statin and PCSK9 inhibitor She is having no muscle side effects of pain or weakness She feels well and has had no chest pain shortness of breath edema palpitation or syncope. I reviewed the results of her echocardiogram Past Medical History:  Diagnosis Date   CAD (coronary artery disease)    2009 RCA occluded proximally, LAD occluded proximally, left main 90% stenosis, LIMA to the LAD widely patent, SVG to OM and diagonal patent, SVG RCA patent. This stenosis after the anastomosis of 80% in the native vessel. This was treated with PCI.   Collagen vascular disease ([place])    Coronary artery disease involving coronary bypass graft of native heart with angina pectoris ([place]) 10/31/2013   Coronary artery disease involving coronary bypass graft of native heart without angina pectoris 06/08/2016   Depression, neurotic 06/17/2012   Dyslipidemia 05/08/2018   Endometrial cancer ([place]) 04/22/2014   Essential (primary) hypertension 06/17/2012   Health maintenance alteration 10/31/2013   Heart disease 06/02/2018   Heart murmur, systolic 86/57/8469   HLD (hyperlipidemia) 06/17/2012   HTN (hypertension)    Hypercalcemia 06/08/2016   Hyperlipidemia    Hyperlipidemia LDL goal <70 06/17/2012   Immunization not carried out because of parent refusal 10/31/2013   Malignant neoplasm of uterus ([place]) 08/03/2019  Menopausal symptoms 07/29/2014   Murmur 11/27/2013   Need for prophylactic vaccination against Streptococcus pneumoniae (pneumococcus) 08/30/2014   Pelvic mass 03/26/2014   Recurrent major depressive disorder, in full remission ([place]) 05/22/2020   Stage 3a chronic kidney disease ([place]) 05/22/2020   Thyroid disorder screening 06/08/2016   Tobacco use  06/04/2016   Vasomotor symptoms due to menopause 11/24/2016    Past Surgical History:  Procedure Laterality Date   ABDOMINAL AORTIC ANEURYSM REPAIR  2003   ABDOMINAL HYSTERECTOMY N/A 03/26/2014   Procedure: TOTAL HYSTERECTOMY ABDOMINAL;  Surgeon: Everitt Amber, MD;  Location: [facility];  Service: Gynecology;  Laterality: N/A;   COLON SURGERY  2007   blockage   CORONARY ARTERY BYPASS GRAFT  1994   LIMA to the LAD, SVG to RCA, SVG sequential to OM and diagonal,   LAPAROTOMY N/A 03/26/2014   Procedure: EXPLORATORY LAPAROTOMY;  Surgeon: Everitt Amber, MD;  Location: [facility];  Service: Gynecology;  Laterality: N/A;   LYSIS OF ADHESION  03/26/2014   Procedure: LYSIS OF ADHESION;  Surgeon: Everitt Amber, MD;  Location: [facility];  Service: Gynecology;;   OMENTECTOMY  03/26/2014   Procedure: OMENTECTOMY;  Surgeon: Everitt Amber, MD;  Location: [facility];  Service: Gynecology;;   SALPINGOOPHORECTOMY Bilateral 03/26/2014   Procedure: BILATERL SALPINGO OOPHORECTOMY;  Surgeon: Everitt Amber, MD;  Location: [facility];  Service: Gynecology;  Laterality: Bilateral;   TONSILLECTOMY     VENTRAL HERNIA REPAIR  03/26/2014   Procedure: HERNIA REPAIR VENTRAL ADULT;  Surgeon: Excell Seltzer, MD;  Location: [facility];  Service: General;;  With MESH    Current Medications: Current Meds  Medication Sig   ALPRAZolam (XANAX) 0.5 MG tablet Take 0.5 mg by mouth daily as needed for anxiety.   amLODipine (NORVASC) 10 MG tablet Take 10 mg by mouth daily.   amphetamine-dextroamphetamine (ADDERALL) 10 MG tablet Take 10 mg by mouth 2 (two) times daily.   aspirin EC 81 MG tablet Take 1 tablet (81 mg total) by mouth daily. Swallow whole.   escitalopram (LEXAPRO) 20 MG tablet Take 20 mg by mouth daily.   estradiol (ESTRACE) 2 MG tablet Take 2 mg by mouth daily.   Evolocumab (REPATHA SURECLICK) 417 MG/ML SOAJ Inject 1 pen into the skin every 14 (fourteen) days.   fluticasone (FLONASE) 50 MCG/ACT nasal spray Place 1 spray into both nostrils daily.    montelukast (SINGULAIR) 10 MG tablet Take 10 mg by mouth daily.   nitroGLYCERIN (NITROSTAT) 0.4 MG SL tablet Place 1 tablet (0.4 mg total) under the tongue every 5 (five) minutes as needed for chest pain.   Omega-3 Fatty Acids (FISH OIL PO) Take 3 capsules by mouth 2 (two) times daily.    oxybutynin (DITROPAN-XL) 5 MG 24 hr tablet Take 5 mg by mouth daily.   potassium chloride (KLOR-CON) 10 MEQ tablet Take 10 mEq by mouth daily.   rosuvastatin (CRESTOR) 40 MG tablet Take 40 mg by mouth every morning.    vitamin B-12 (CYANOCOBALAMIN) 1000 MCG tablet Take 1,000 mcg by mouth daily.   [DISCONTINUED] nitroGLYCERIN (NITROSTAT) 0.4 MG SL tablet DISSOLVE ONE TABLET UNDER THE TONGUE EVERY 5 MINUTES AS NEEDED FOR CHEST PAIN.  DO NOT EXCEED A TOTAL OF 3 DOSES IN 15 MINUTES     Allergies:   Patient has no known allergies.   Social History   Socioeconomic History   Marital status: Married    Spouse name: Not on file   Number of children: 3   Years of education: Not  on file   Highest education level: Not on file  Occupational History   Not on file  Tobacco Use   Smoking status: Former    Packs/day: 0.25    Types: Cigarettes   Smokeless tobacco: Never  Vaping Use   Vaping Use: Never used  Substance and Sexual Activity   Alcohol use: No    Alcohol/week: 0.0 standard drinks of alcohol   Drug use: No   Sexual activity: Not Currently  Other Topics Concern   Not on file  Social History Narrative   Lives with husband and 26 year old.     Social Determinants of Health   Financial Resource Strain: Not on file  Food Insecurity: Not on file  Transportation Needs: Not on file  Physical Activity: Not on file  Stress: Not on file  Social Connections: Not on file     Family History: The patient's family history includes CAD (age of onset: 38) in her father; Cancer in her sister; Hypertension in her brother. ROS:   Please see the history of present illness.    All other systems reviewed and  are negative.  EKGs/Labs/Other Studies Reviewed:    The following studies were reviewed today:  EKG:  EKG ordered today and personally reviewed.  The ekg ordered today demonstrates sinus rhythm same pattern of diffuse repolarization ST-T abnormality.  Recent Labs: No results found for requested labs within last 365 days.  Recent Lipid Panel    Component Value Date/Time   TRIG 191 (H) 04/03/2014 0501    Physical Exam:    VS:  BP 120/74   Pulse 84   Ht '5\' 9"'$  (1.753 m)   Wt 170 lb 1.3 oz (77.1 kg)   SpO2 96%   BMI 25.12 kg/m     Wt Readings from Last 3 Encounters:  08/20/21 170 lb 1.3 oz (77.1 kg)  06/19/20 172 lb (78 kg)  12/28/19 176 lb (79.8 kg)     GEN:  Well nourished, well developed in no acute distress HEENT: Normal NECK: No JVD; No carotid bruits LYMPHATICS: No lymphadenopathy CARDIAC: Grade 1/6 to 2/6 localized aortic area systolic murmur RRR, no murmurs, rubs, gallops RESPIRATORY:  Clear to auscultation without rales, wheezing or rhonchi  ABDOMEN: Soft, non-tender, non-distended MUSCULOSKELETAL:  No edema; No deformity  SKIN: Warm and dry NEUROLOGIC:  Alert and oriented x 3 PSYCHIATRIC:  Normal affect    Signed, Shirlee More, MD  08/20/2021 4:23 PM    [facility]

## 2021-08-31 ENCOUNTER — Ambulatory Visit (HOSPITAL_BASED_OUTPATIENT_CLINIC_OR_DEPARTMENT_OTHER): Payer: Medicare Other

## 2021-09-09 ENCOUNTER — Ambulatory Visit (HOSPITAL_BASED_OUTPATIENT_CLINIC_OR_DEPARTMENT_OTHER): Admission: RE | Admit: 2021-09-09 | Payer: Medicare Other | Source: Ambulatory Visit

## 2022-01-02 IMAGING — MG MM DIGITAL DIAGNOSTIC UNILAT*R* W/ TOMO W/ CAD
4 series · 4 of 12 positions shown · non-contrast
Comparison: Previous exam(s).

CLINICAL DATA: Recall from screening mammography, possible
asymmetry in the outer RIGHT breast at middle to posterior depth
visible only on the CC view.

EXAM:
DIGITAL DIAGNOSTIC UNILATERAL RIGHT MAMMOGRAM WITH TOMOSYNTHESIS AND
CAD
TECHNIQUE: Right digital diagnostic mammography and breast tomosynthesis was
performed. The images were evaluated with computer-aided detection.

[R CC synth-2D]
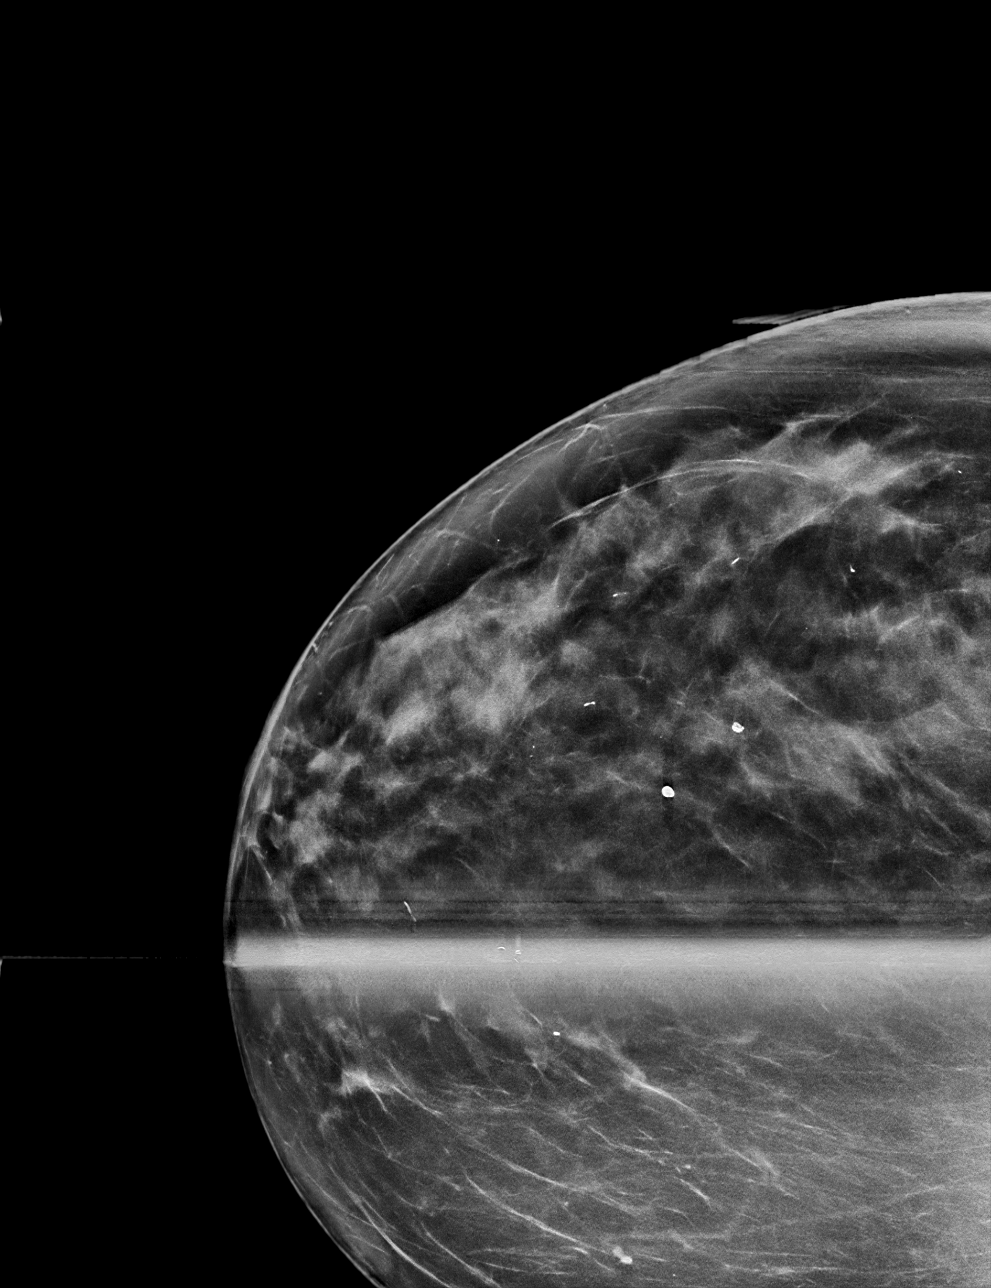

[R ML synth-2D]
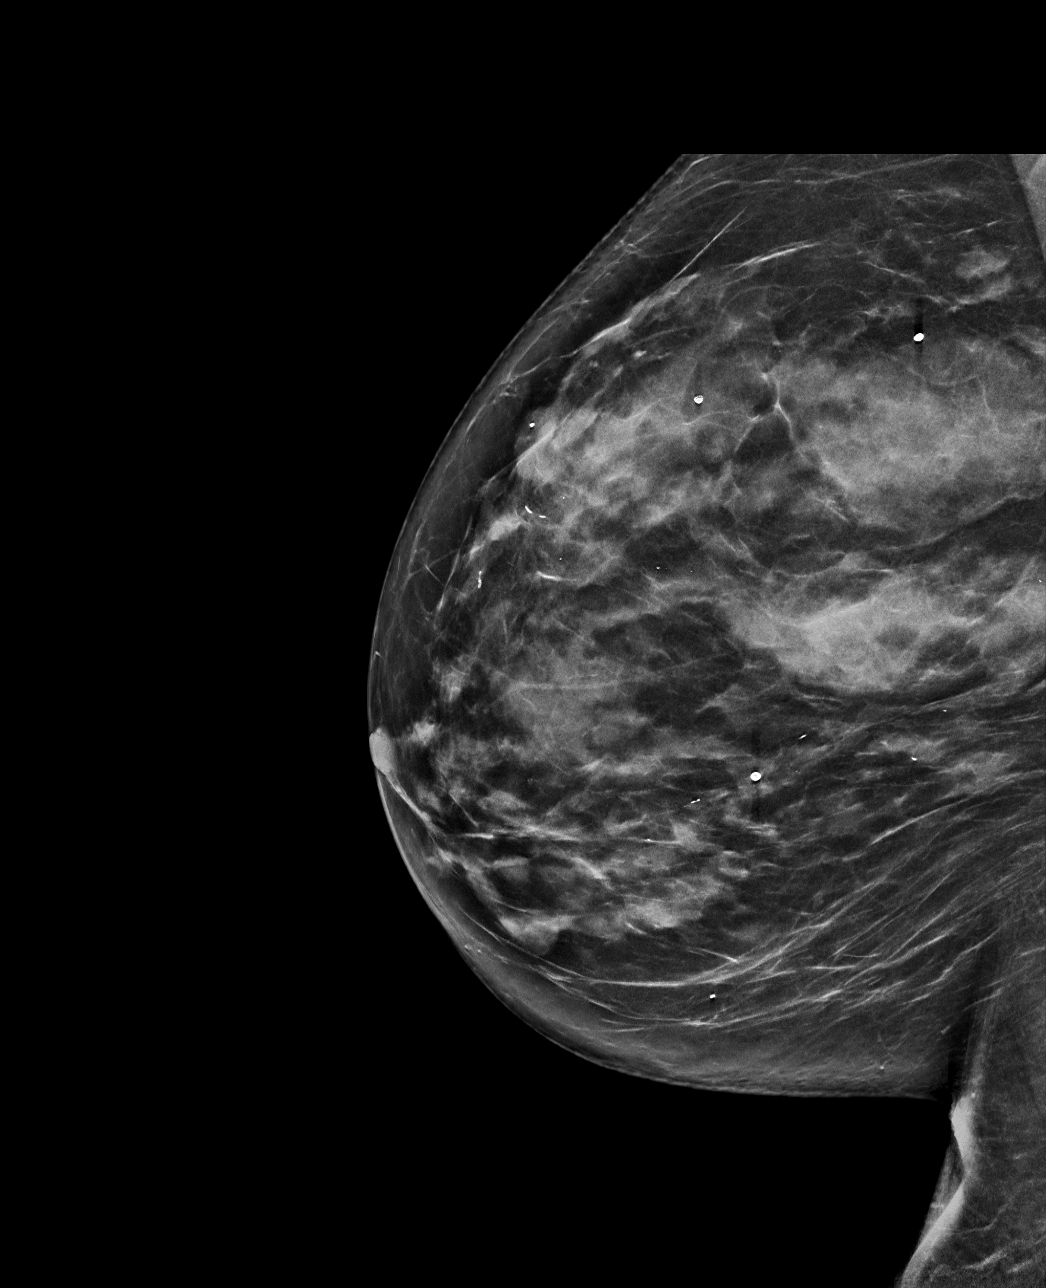

[R ML tomo · tomo slice 39/77.0]
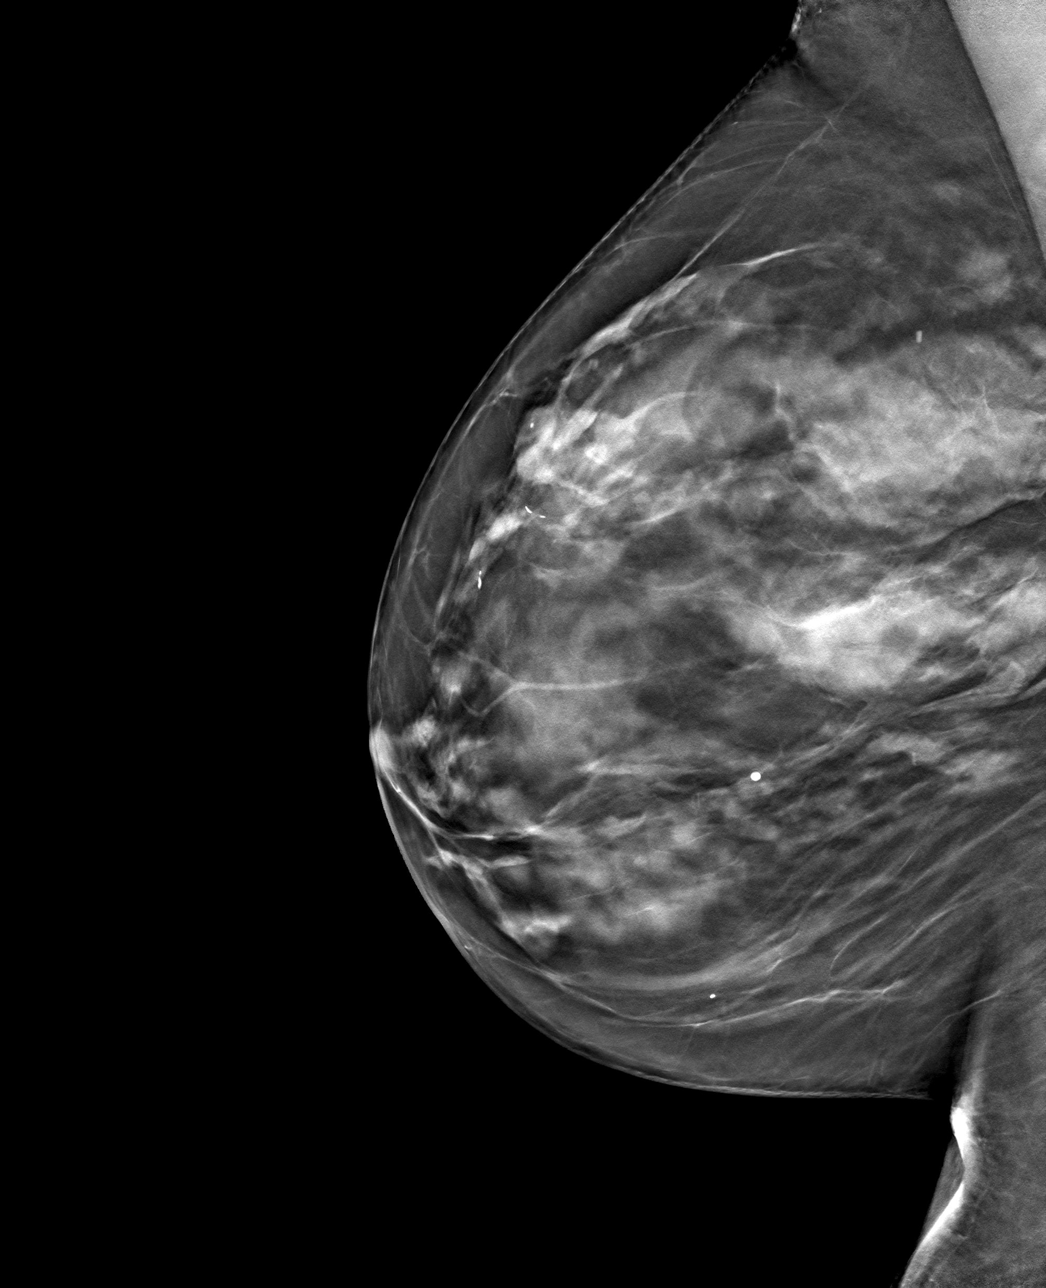

[R CC tomo · tomo slice 37/72.0]
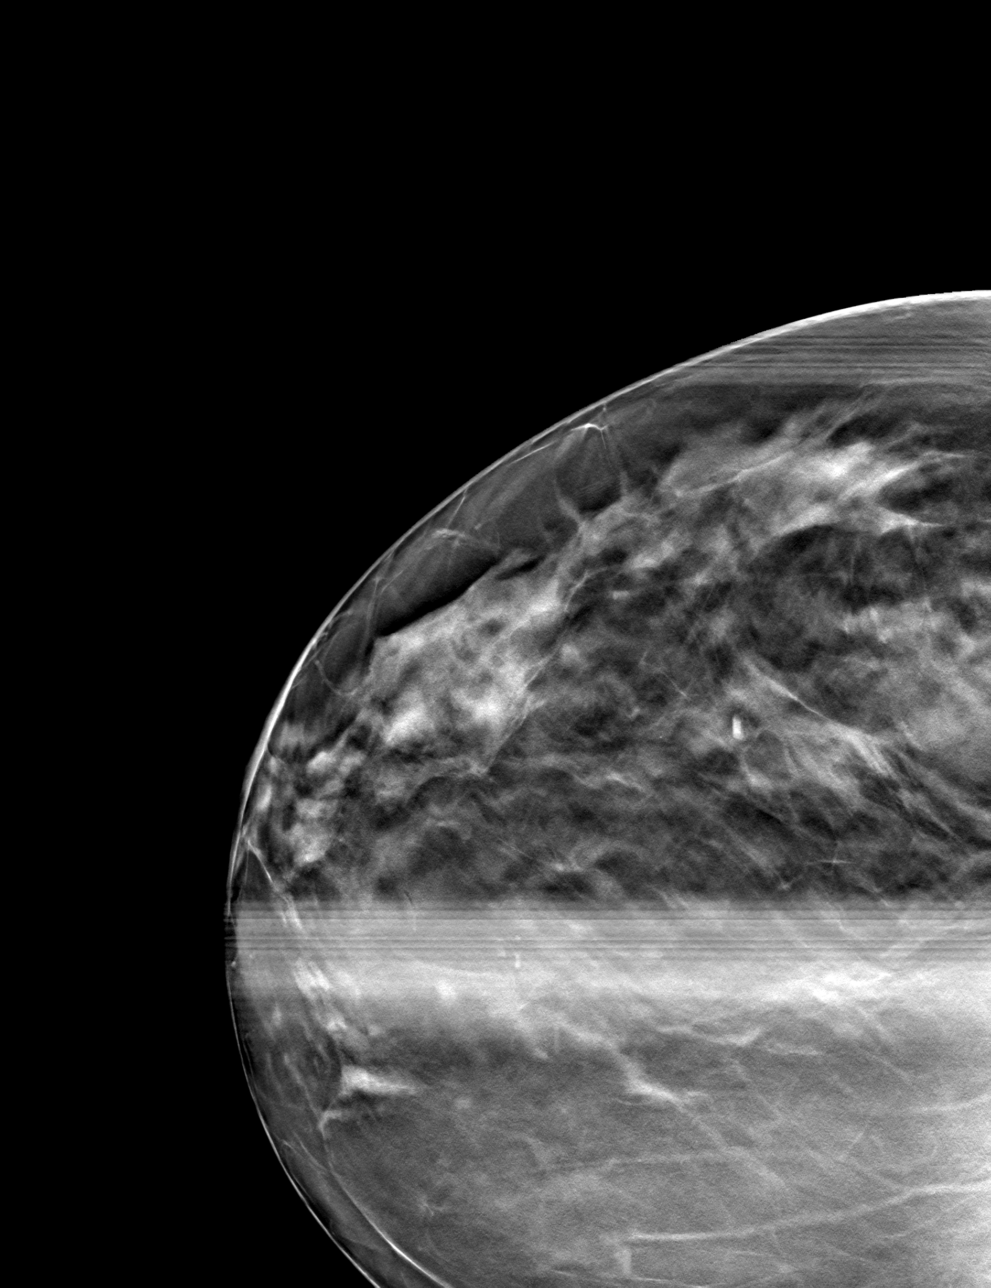

[4 of 12 positions shown; findings below may reference images not displayed]

ACR Breast Density Category c: The breast tissue is heterogeneously
dense, which may obscure small masses.
FINDINGS: Spot-compression CC view of the area of concern and a full field
mediolateral view were obtained.

The asymmetry in the outer breast at middle to posterior depth
questioned on screening mammography disperses with compression,
indicating overlapping fibroglandular tissue. There is no underlying
mass or architectural distortion.

No findings suspicious for malignancy.
IMPRESSION: No mammographic evidence of malignancy involving the RIGHT breast.

RECOMMENDATION:
Screening mammogram in one year.(Code:L5-D-KUU)

I have discussed the findings and recommendations with the patient.
If applicable, a reminder letter will be sent to the patient
regarding the next appointment.

BI-RADS CATEGORY  1: Negative.

## 2022-01-25 ENCOUNTER — Telehealth: Payer: Self-pay

## 2022-01-25 NOTE — Telephone Encounter (Signed)
Called patient for updated insurance card for prior authorization for her [place]. Patient stated that she has a new insurance card that is now with        [employer].  Member ID # [id number]         Group # [id number] Health Plan # [id number]      Rx Bin # [id number]         PCN # [phone]  Instructed patient to bring new card to be scanned into her chart at next appointment   PA submitted with CMM  Roney Mans (Key: [place])

## 2022-01-25 NOTE — Telephone Encounter (Signed)
Patient notified of PA approval for Repatha  Request Reference Number: [id number]. REPATHA SURE INJ '140MG'$ /ML is approved through 07/26/2022.

## 2022-02-05 ENCOUNTER — Other Ambulatory Visit (HOSPITAL_COMMUNITY): Payer: Self-pay

## 2022-02-13 ENCOUNTER — Other Ambulatory Visit: Payer: Self-pay | Admitting: Cardiology

## 2022-02-16 NOTE — Telephone Encounter (Signed)
Patient calling back in regards to her repatha. Please advise

## 2022-02-19 NOTE — Telephone Encounter (Signed)
Returned call to patient , left voicemail for patient to call back

## 2022-05-21 ENCOUNTER — Telehealth: Payer: Self-pay | Admitting: Cardiology

## 2022-05-21 MED ORDER — REPATHA SURECLICK 140 MG/ML ~~LOC~~ SOAJ: 1.0000 mL | SUBCUTANEOUS | 3 refills | Status: DC

## 2022-05-21 NOTE — Telephone Encounter (Signed)
Refills has been sent to the pharmacy. 

## 2022-05-21 NOTE — Telephone Encounter (Signed)
*  STAT* If patient is at the pharmacy, call can be transferred to refill team.   1. Which medications need to be refilled? (please list name of each medication and dose if known)   Evolocumab (REPATHA SURECLICK) 140 MG/ML SOAJ    2. Which pharmacy/location (including street and city if local pharmacy) is medication to be sent to? [facility] [address] [place], [place] - [address]   3. Do they need a 30 day or 90 day supply? 30 day

## 2022-08-25 ENCOUNTER — Other Ambulatory Visit (HOSPITAL_COMMUNITY): Payer: Self-pay

## 2022-08-25 ENCOUNTER — Telehealth: Payer: Self-pay

## 2022-08-25 NOTE — Telephone Encounter (Signed)
Pharmacy Patient Advocate Encounter   Received notification from CoverMyMeds that prior authorization for REPATHA is required/requested.   Insurance verification completed.   The patient is insured through [facility] .   Per test claim: PA required; PA submitted to [facility] via CoverMyMeds Key/confirmation #/EOC [id number]    Status is pending

## 2022-08-26 ENCOUNTER — Other Ambulatory Visit (HOSPITAL_COMMUNITY): Payer: Self-pay

## 2022-08-26 NOTE — Telephone Encounter (Signed)
Pharmacy Patient Advocate Encounter  Received notification from [facility] that Prior Authorization for REPATHA has been  APPROVED THROUGH 12.31.24   PA #/Case ID/Reference #: (Key: [id number])   Please see tes claim below as the Rx has been pickedup and is not yet due.

## 2022-09-24 ENCOUNTER — Other Ambulatory Visit: Payer: Self-pay | Admitting: Cardiology

## 2022-10-19 ENCOUNTER — Ambulatory Visit: Payer: Medicare Other | Attending: Cardiology | Admitting: Cardiology

## 2022-10-19 ENCOUNTER — Encounter: Payer: Self-pay | Admitting: Cardiology

## 2022-10-19 VITALS — BP 120/80 | HR 72 | Ht 69.0 in | Wt 171.0 lb

## 2022-10-19 DIAGNOSIS — I10 Essential (primary) hypertension: Secondary | ICD-10-CM | POA: Diagnosis not present

## 2022-10-19 DIAGNOSIS — I350 Nonrheumatic aortic (valve) stenosis: Secondary | ICD-10-CM | POA: Diagnosis not present

## 2022-10-19 DIAGNOSIS — I25709 Atherosclerosis of coronary artery bypass graft(s), unspecified, with unspecified angina pectoris: Secondary | ICD-10-CM

## 2022-10-19 DIAGNOSIS — E782 Mixed hyperlipidemia: Secondary | ICD-10-CM

## 2022-10-19 DIAGNOSIS — Z789 Other specified health status: Secondary | ICD-10-CM

## 2022-10-19 NOTE — Patient Instructions (Signed)
Medication Instructions:  Your physician recommends that you continue on your current medications as directed. Please refer to the Current Medication list given to you today.  *If you need a refill on your cardiac medications before your next appointment, please call your pharmacy*   Lab Work: None If you have labs (blood work) drawn today and your tests are completely normal, you will receive your results only by: [place] (if you have MyChart) OR A paper copy in the mail If you have any lab test that is abnormal or we need to change your treatment, we will call you to review the results.   Testing/Procedures: Your physician has requested that you have an echocardiogram. Echocardiography is a painless test that uses sound waves to create images of your heart. It provides your doctor with information about the size and shape of your heart and how well your heart's chambers and valves are working. This procedure takes approximately one hour. There are no restrictions for this procedure. Please do NOT wear cologne, perfume, aftershave, or lotions (deodorant is allowed). Please arrive 15 minutes prior to your appointment time.    Follow-Up: At [facility], you and your health needs are our priority.  As part of our continuing mission to provide you with exceptional heart care, we have created designated Provider Care Teams.  These Care Teams include your primary Cardiologist (physician) and Advanced Practice Providers (APPs -  Physician Assistants and Nurse Practitioners) who all work together to provide you with the care you need, when you need it.  We recommend signing up for the patient portal called "MyChart".  Sign up information is provided on this After Visit Summary.  MyChart is used to connect with patients for Virtual Visits (Telemedicine).  Patients are able to view lab/test results, encounter notes, upcoming appointments, etc.  Non-urgent messages can be sent to your  provider as well.   To learn more about what you can do with MyChart, go to [email or web address].    Your next appointment:   1 year(s)  Provider:   Shirlee More, MD    Other Instructions None

## 2022-10-19 NOTE — Progress Notes (Unsigned)
Cardiology Office Note:    Date:  10/19/2022   ID:  [name], DOB 1945-03-17, MRN [id number]  PCP:  Shon Baton, PA-C  Cardiologist:  Norman Herrlich, MD    Referring MD: [facility]*    ASSESSMENT:    1. Coronary artery disease involving coronary bypass graft of native heart with angina pectoris (HCC)   2. Nonrheumatic aortic valve stenosis   3. Essential (primary) hypertension   4. Statin intolerance   5. Mixed hyperlipidemia    PLAN:    In order of problems listed above:  She continues to do well with CAD following remote PCI having no angina on current medical treatment will continue aspirin antihypertensive and her combined lipid-lowering therapy with Repatha and Crestor. Recheck echocardiogram in greater than 2 years but physical exam does not seem to have progressed Well-controlled currently with her calcium channel blocker continued Continue combined lipid-lowering therapy to achieve target with her CAD   Next appointment: 1 year   Medication Adjustments/Labs and Tests Ordered: Current medicines are reviewed at length with the patient today.  Concerns regarding medicines are outlined above.  No orders of the defined types were placed in this encounter.  No orders of the defined types were placed in this encounter.    History of Present Illness:    [name] is a 77 y.o. female with a hx of CAD with PCI hypertension hyperlipidemia hypokalemia and mild aortic stenosis last seen 08/20/2021.  Her most recent echocardiogram 07/16/2020 showed calcification of aortic valve with mild stenosis valve area 1.42 mean gradient 16 mmHg.  Send labs 06/03/2022 CMP with a sodium 140 potassium 3.4 creatinine 1.27 GFR 44 cc/min normal liver function test Cholesterol 124 LDL 66 triglycerides 125 HDL 37  Compliance with diet, lifestyle and medications: Yes  She has done well in the last year she has had no anginal discomfort edema shortness of breath chest  pain palpitation or syncope He tolerates her Repatha without muscle pain or weakness She has CKD which appears to be progressive on her recent CMP also hypokalemia She continues to take Adderall. Past Medical History:  Diagnosis Date   CAD (coronary artery disease)    2009 RCA occluded proximally, LAD occluded proximally, left main 90% stenosis, LIMA to the LAD widely patent, SVG to OM and diagonal patent, SVG RCA patent. This stenosis after the anastomosis of 80% in the native vessel. This was treated with PCI.   Collagen vascular disease (HCC)    Coronary artery disease involving coronary bypass graft of native heart with angina pectoris ([facility]) 10/31/2013   Coronary artery disease involving coronary bypass graft of native heart without angina pectoris 06/08/2016   Depression, neurotic 06/17/2012   Dyslipidemia 05/08/2018   Endometrial cancer (H[facility]) 04/22/2014   Essential (primary) hypertension 06/17/2012   Health maintenance alteration 10/31/2013   Heart disease 06/02/2018   Heart murmur, systolic 10/31/2013   HLD (hyperlipidemia) 06/17/2012   HTN (hypertension)    Hypercalcemia 06/08/2016   Hyperlipidemia    Hyperlipidemia LDL goal <70 06/17/2012   Immunization not carried out because of parent refusal 10/31/2013   Malignant neoplasm of uterus ([facility]CC) 08/03/2019   Menopausal symptoms 07/29/2014   Murmur 11/27/2013   Need for prophylactic vaccination against Streptococcus pneumoniae (pneumococcus) 08/30/2014   Pelvic mass 03/26/2014   Recurrent major depressive disorder, in full remission (HCC) 05/22/2020   Stage 3a chronic kidney disease (HCC) 05/22/2020   Thyroid disorder screening 06/08/2016   Tobacco use 06/04/2016   Vasomotor  symptoms due to menopause 11/24/2016    Current Medications: Current Meds  Medication Sig   ALPRAZolam (XANAX) 0.5 MG tablet Take 0.5 mg by mouth daily as needed for anxiety.   amLODipine (NORVASC) 10 MG tablet Take 10 mg by mouth daily.   amphetamine-dextroamphetamine  (ADDERALL) 10 MG tablet Take 10 mg by mouth 2 (two) times daily.   aspirin EC 81 MG tablet Take 1 tablet (81 mg total) by mouth daily. Swallow whole.   escitalopram (LEXAPRO) 20 MG tablet Take 20 mg by mouth daily.   estradiol (ESTRACE) 2 MG tablet Take 2 mg by mouth daily.   Evolocumab (REPATHA SURECLICK) 140 MG/ML SOAJ Inject 140 mg into the skin every 14 (fourteen) days.   montelukast (SINGULAIR) 10 MG tablet Take 10 mg by mouth daily.   nitroGLYCERIN (NITROSTAT) 0.4 MG SL tablet DISSOLVE ONE TABLET UNDER THE TONGUE EVERY 5 MINUTES AS NEEDED FOR CHEST PAIN.  DO NOT EXCEED A TOTAL OF 3 DOSES IN 15 MINUTES   Omega-3 Fatty Acids (FISH OIL PO) Take 3 capsules by mouth 2 (two) times daily.    oxybutynin (DITROPAN-XL) 5 MG 24 hr tablet Take 5 mg by mouth daily.   potassium chloride (KLOR-CON) 10 MEQ tablet Take 10 mEq by mouth daily.   rosuvastatin (CRESTOR) 40 MG tablet Take 40 mg by mouth every morning.    vitamin B-12 (CYANOCOBALAMIN) 1000 MCG tablet Take 1,000 mcg by mouth daily.      EKGs/Labs/Other Studies Reviewed:    The following studies were reviewed today:  Her EKG today shows sinus rhythm she has a pattern best interpreted as LVH repolarization unchanged from her baseline EKG from August 2023.   Physical Exam:    VS:  BP 120/80 (BP Location: Right Arm, Patient Position: Sitting, Cuff Size: Normal)   Pulse 72   Ht 5\' 9"  (1.753 m)   Wt 171 lb (77.6 kg)   SpO2 96%   BMI 25.25 kg/m     Wt Readings from Last 3 Encounters:  10/19/22 171 lb (77.6 kg)  08/20/21 170 lb 1.3 oz (77.1 kg)  06/19/20 172 lb (78 kg)     GEN:  Well nourished, well developed in no acute distress HEENT: Normal NECK: No JVD; No carotid bruits LYMPHATICS: No lymphadenopathy CARDIAC: RRR, no murmurs, rubs, gallops RESPIRATORY:  Clear to auscultation without rales, wheezing or rhonchi  ABDOMEN: Soft, non-tender, non-distended MUSCULOSKELETAL:  No edema; No deformity  SKIN: Warm and dry NEUROLOGIC:   Alert and oriented x 3 PSYCHIATRIC:  Normal affect    Signed, Norman Herrlich, MD  10/19/2022 4:26 PM    [facility]

## 2022-11-15 ENCOUNTER — Ambulatory Visit: Payer: Medicare Other

## 2022-12-03 ENCOUNTER — Other Ambulatory Visit: Payer: Medicare Other

## 2022-12-27 ENCOUNTER — Ambulatory Visit: Payer: Medicare Other

## 2023-01-20 ENCOUNTER — Other Ambulatory Visit: Payer: Medicare Other

## 2023-02-08 ENCOUNTER — Ambulatory Visit: Payer: Medicare Other | Attending: Cardiology

## 2023-06-01 ENCOUNTER — Other Ambulatory Visit: Payer: Self-pay | Admitting: Cardiology

## 2023-06-01 DIAGNOSIS — E782 Mixed hyperlipidemia: Secondary | ICD-10-CM

## 2023-06-01 DIAGNOSIS — Z789 Other specified health status: Secondary | ICD-10-CM

## 2023-06-01 DIAGNOSIS — I25709 Atherosclerosis of coronary artery bypass graft(s), unspecified, with unspecified angina pectoris: Secondary | ICD-10-CM

## 2023-08-03 ENCOUNTER — Telehealth: Payer: Self-pay | Admitting: Cardiology

## 2023-08-03 NOTE — Telephone Encounter (Signed)
 Scheduled

## 2023-08-03 NOTE — Telephone Encounter (Signed)
 Pt is calling to re-schedule echo from 02/08/23 that she no showed. No order in the system to schedule from and will need pre-cert again

## 2023-08-23 ENCOUNTER — Ambulatory Visit

## 2023-09-14 ENCOUNTER — Ambulatory Visit: Attending: Cardiology

## 2023-09-14 ENCOUNTER — Other Ambulatory Visit: Payer: Self-pay | Admitting: Cardiology

## 2023-09-14 DIAGNOSIS — E782 Mixed hyperlipidemia: Secondary | ICD-10-CM | POA: Diagnosis not present

## 2023-09-14 DIAGNOSIS — Z789 Other specified health status: Secondary | ICD-10-CM

## 2023-09-14 DIAGNOSIS — I350 Nonrheumatic aortic (valve) stenosis: Secondary | ICD-10-CM | POA: Diagnosis not present

## 2023-09-14 DIAGNOSIS — I10 Essential (primary) hypertension: Secondary | ICD-10-CM

## 2023-09-14 DIAGNOSIS — I25709 Atherosclerosis of coronary artery bypass graft(s), unspecified, with unspecified angina pectoris: Secondary | ICD-10-CM

## 2023-09-15 ENCOUNTER — Ambulatory Visit: Payer: Self-pay | Admitting: Cardiology

## 2023-09-15 LAB — ECHOCARDIOGRAM COMPLETE
AR max vel: 1.23 cm2
AV Area VTI: 1.35 cm2
AV Area mean vel: 1.24 cm2
AV Mean grad: 28 mmHg
AV Peak grad: 51.6 mmHg
Ao pk vel: 3.59 m/s
Area-P 1/2: 3.72 cm2
P 1/2 time: 527 ms
S' Lateral: 3.4 cm

## 2023-09-20 ENCOUNTER — Other Ambulatory Visit: Payer: Self-pay

## 2023-09-20 DIAGNOSIS — I350 Nonrheumatic aortic (valve) stenosis: Secondary | ICD-10-CM

## 2023-09-28 DIAGNOSIS — Z90710 Acquired absence of both cervix and uterus: Secondary | ICD-10-CM | POA: Insufficient documentation

## 2023-09-28 DIAGNOSIS — E559 Vitamin D deficiency, unspecified: Secondary | ICD-10-CM | POA: Insufficient documentation

## 2023-09-29 ENCOUNTER — Encounter (HOSPITAL_BASED_OUTPATIENT_CLINIC_OR_DEPARTMENT_OTHER): Payer: Self-pay

## 2023-09-29 ENCOUNTER — Encounter: Payer: Self-pay | Admitting: Cardiology

## 2023-10-10 ENCOUNTER — Ambulatory Visit (INDEPENDENT_AMBULATORY_CARE_PROVIDER_SITE_OTHER)
Admission: RE | Admit: 2023-10-10 | Discharge: 2023-10-10 | Disposition: A | Discharge status: Home / Self Care | Source: Ambulatory Visit | Attending: Cardiology | Admitting: Cardiology

## 2023-10-10 DIAGNOSIS — R9389 Abnormal findings on diagnostic imaging of other specified body structures: Secondary | ICD-10-CM | POA: Diagnosis not present

## 2023-10-10 DIAGNOSIS — I350 Nonrheumatic aortic (valve) stenosis: Secondary | ICD-10-CM | POA: Diagnosis not present

## 2023-10-14 ENCOUNTER — Ambulatory Visit: Attending: Cardiology | Admitting: Cardiology

## 2023-10-14 ENCOUNTER — Ambulatory Visit: Payer: Self-pay | Admitting: Cardiology

## 2023-10-14 ENCOUNTER — Encounter: Payer: Self-pay | Admitting: Cardiology

## 2023-10-14 VITALS — BP 148/88 | HR 83 | Ht 69.0 in | Wt 173.4 lb

## 2023-10-14 DIAGNOSIS — I25709 Atherosclerosis of coronary artery bypass graft(s), unspecified, with unspecified angina pectoris: Secondary | ICD-10-CM | POA: Diagnosis not present

## 2023-10-14 DIAGNOSIS — I7789 Other specified disorders of arteries and arterioles: Secondary | ICD-10-CM

## 2023-10-14 DIAGNOSIS — I10 Essential (primary) hypertension: Secondary | ICD-10-CM

## 2023-10-14 DIAGNOSIS — E782 Mixed hyperlipidemia: Secondary | ICD-10-CM

## 2023-10-14 DIAGNOSIS — I351 Nonrheumatic aortic (valve) insufficiency: Secondary | ICD-10-CM

## 2023-10-14 DIAGNOSIS — I350 Nonrheumatic aortic (valve) stenosis: Secondary | ICD-10-CM

## 2023-10-14 MED ORDER — AMOXICILLIN 500 MG PO CAPS: ORAL_CAPSULE | ORAL | 0 refills | Status: AC

## 2023-10-14 NOTE — Patient Instructions (Signed)
 Medication Instructions:  Your physician has recommended you make the following change in your medication:   START: Amoxicillin 500 mg - Take 4 (500 mg) tablets one hour before any dental procedures.  *If you need a refill on your cardiac medications before your next appointment, please call your pharmacy*  Lab Work: None If you have labs (blood work) drawn today and your tests are completely normal, you will receive your results only by: MyChart Message (if you have MyChart) OR A paper copy in the mail If you have any lab test that is abnormal or we need to change your treatment, we will call you to review the results.  Testing/Procedures: Your physician has requested that you have an echocardiogram. Echocardiography is a painless test that uses sound waves to create images of your heart. It provides your doctor with information about the size and shape of your heart and how well your heart's chambers and valves are working. This procedure takes approximately one hour. There are no restrictions for this procedure. Please do NOT wear cologne, perfume, aftershave, or lotions (deodorant is allowed). Please arrive 15 minutes prior to your appointment time.  Please note: We ask at that you not bring children with you during ultrasound (echo/ vascular) testing. Due to room size and safety concerns, children are not allowed in the ultrasound rooms during exams. Our front office staff cannot provide observation of children in our lobby area while testing is being conducted. An adult accompanying a patient to their appointment will only be allowed in the ultrasound room at the discretion of the ultrasound technician under special circumstances. We apologize for any inconvenience.  Non-Cardiac CT scanning, (CAT scanning), is a noninvasive, special x-ray that produces cross-sectional images of the body using x-rays and a computer. CT scans help physicians diagnose and treat medical conditions. For some CT  exams, a contrast material is used to enhance visibility in the area of the body being studied. CT scans provide greater clarity and reveal more details than regular x-ray exams.   Follow-Up: At [facility], you and your health needs are our priority.  As part of our continuing mission to provide you with exceptional heart care, our providers are all part of one team.  This team includes your primary Cardiologist (physician) and Advanced Practice Providers or APPs (Physician Assistants and Nurse Practitioners) who all work together to provide you with the care you need, when you need it.  Your next appointment:   1 year(s)  Provider:   Redell Leiter, MD    We recommend signing up for the patient portal called MyChart.  Sign up information is provided on this After Visit Summary.  MyChart is used to connect with patients for Virtual Visits (Telemedicine).  Patients are able to view lab/test results, encounter notes, upcoming appointments, etc.  Non-urgent messages can be sent to your provider as well.   To learn more about what you can do with MyChart, go to [email or web address].   Other Instructions None

## 2023-10-14 NOTE — Progress Notes (Signed)
 Cardiology Office Note:    Date:  10/14/2023   ID:  [name], DOB Jul 18, 1945, MRN [id number]  PCP:  Allison Odor, PA-C  Cardiologist:  Allison Leiter, MD    Referring MD: Allison Odor, PA-C    ASSESSMENT:    1. Nonrheumatic aortic valve stenosis   2. Nonrheumatic aortic valve insufficiency   3. Mixed hyperlipidemia   4. Statin intolerance   5. Coronary artery disease involving coronary bypass graft of native heart with angina pectoris   6. Essential (primary) hypertension   7. Ascending aorta enlargement    PLAN:    In order of problems listed above:  Some progression but not severe plan repeat echocardiogram in 1 year for combined stenosis regurgitation and initiate endocarditis prophylaxis Continue treatment per PCP with combined Repatha  and rosuvastatin  which is well-tolerated Stable CAD having no anginal discomfort medical therapy is aspirin  and calcium  channel blocker Enlargement ascending aorta follow-up CT 1 year Thyroid mass I will direct her back to her PCP for further evaluation    Next appointment: 1 year after echo CT   Medication Adjustments/Labs and Tests Ordered: Current medicines are reviewed at length with the patient today.  Concerns regarding medicines are outlined above.  Orders Placed This Encounter  Procedures   CT Chest Wo Contrast   EKG 12-Lead   ECHOCARDIOGRAM COMPLETE   Meds ordered this encounter  Medications   amoxicillin (AMOXIL) 500 MG capsule    Sig: Please take 4 (500 mg) tablets one hour before any dental procedures.    Dispense:  20 capsule    Refill:  0     History of Present Illness:    [name] is a 78 y.o. female with a hx of CAD with CABG  mild aortic stenosis hypertension hyper lipidemia and hypokalemia last seen 10/19/2022.  She had an echocardiogram 09/14/2023 left ventricle normal in size moderate LVH EF 60 to 65.  She had mild to moderate aortic regurgitation half-time 527 and moderate aortic  stenosis mean gradient 28 mmHg with a VTI ratio 0.4 the ascending aorta was also dilated.  She is having a CT of the chest performed for measuring the aorta accurately.  IMPRESSION: 1. Aortic atherosclerosis with increased aneurysmal bulging of the distal arch to 4.2 cm, increased prominence in the ascending segment 4.1 x 4.2 cm. Recommend semi-annual imaging followup by CTA or MRA and referral to cardiothoracic surgery if not already obtained. Circulation 2010; 121: [id number]. 2. Aortic valve leaflet thickening and calcifications. 3. 6 mm right lower lobe nodule. Attention on follow-up CT for the aortic disease recommended. 4. Emphysema. 5. 4.6 x 2.6 cm heterogeneous mass in the right lobe of the thyroid gland, larger than previously. Nonemergent follow-up ultrasound is recommended.  Compliance with diet, lifestyle and medications: Yes  Her previous CT scan 2016 She was unaware she had a thyroid abnormality I will send a copy to her PCP and I asked her to follow-up with recommended ultrasound of her thyroid. Her aortic stenosis progressed from mild to moderate and we will plan a follow-up echocardiogram in 1 year repeat the CT of the chest in 1 year for enlargement of her aorta and also to follow-up the pulmonary nodule She still has her teeth and I gave her prescription for antibiotics endocarditis prophylaxis she has some dental extraction coming up She is not having chest pain edema shortness of breath palpitation or syncope Past Medical History:  Diagnosis Date   CAD (coronary artery disease)  2009 RCA occluded proximally, LAD occluded proximally, left main 90% stenosis, LIMA to the LAD widely patent, SVG to OM and diagonal patent, SVG RCA patent. This stenosis after the anastomosis of 80% in the native vessel. This was treated with PCI.   Collagen vascular disease    Coronary artery disease involving coronary bypass graft of native heart with angina pectoris 10/31/2013   Coronary  artery disease involving coronary bypass graft of native heart without angina pectoris 06/08/2016   Depression, neurotic 06/17/2012   Dyslipidemia 05/08/2018   Endometrial cancer (HCC) 04/22/2014   Essential (primary) hypertension 06/17/2012   Health maintenance alteration 10/31/2013   Heart disease 06/02/2018   Heart murmur, systolic 10/31/2013   History of endometrial cancer 04/22/2014   HLD (hyperlipidemia) 06/17/2012   HTN (hypertension)    Hypercalcemia 06/08/2016   Hyperlipidemia    Hyperlipidemia LDL goal <70 06/17/2012   Immunization not carried out because of parent refusal 10/31/2013   Malignant neoplasm of uterus (HCC) 08/03/2019   Menopausal symptoms 07/29/2014   Murmur 11/27/2013   Need for prophylactic vaccination against Streptococcus pneumoniae (pneumococcus) 08/30/2014   Pelvic mass 03/26/2014   Recurrent major depressive disorder, in full remission 05/22/2020   S/P hysterectomy 09/28/2023   Stage 3a chronic kidney disease (HCC) 05/22/2020   Thyroid disorder screening 06/08/2016   Tobacco use 06/04/2016   Vasomotor symptoms due to menopause 11/24/2016   Vitamin B12 deficiency 07/31/2021   Vitamin D deficiency 09/28/2023    Current Medications: Current Meds  Medication Sig   ALPRAZolam  (XANAX ) 0.5 MG tablet Take 0.5 mg by mouth daily as needed for anxiety.   amLODipine  (NORVASC ) 10 MG tablet Take 10 mg by mouth daily.   amoxicillin (AMOXIL) 500 MG capsule Please take 4 (500 mg) tablets one hour before any dental procedures.   amphetamine-dextroamphetamine (ADDERALL) 10 MG tablet Take 10 mg by mouth 2 (two) times daily.   aspirin  EC 81 MG tablet Take 1 tablet (81 mg total) by mouth daily. Swallow whole.   escitalopram  (LEXAPRO ) 20 MG tablet Take 20 mg by mouth daily.   estradiol  (ESTRACE ) 2 MG tablet Take 2 mg by mouth daily.   Evolocumab  (REPATHA  SURECLICK) 140 MG/ML SOAJ INJECT 140 MG INTO THE SKIN EVERY 14 DAYS   montelukast (SINGULAIR) 10 MG tablet Take 10  mg by mouth daily.   nitroGLYCERIN  (NITROSTAT ) 0.4 MG SL tablet DISSOLVE ONE TABLET UNDER THE TONGUE EVERY 5 MINUTES AS NEEDED FOR CHEST PAIN.  DO NOT EXCEED A TOTAL OF 3 DOSES IN 15 MINUTES   Omega-3 Fatty Acids (FISH OIL PO) Take 3 capsules by mouth 2 (two) times daily.    oxybutynin (DITROPAN-XL) 5 MG 24 hr tablet Take 5 mg by mouth daily.   potassium chloride  (KLOR-CON ) 10 MEQ tablet Take 10 mEq by mouth daily.   rosuvastatin  (CRESTOR ) 40 MG tablet Take 40 mg by mouth every morning.    vitamin B-12 (CYANOCOBALAMIN) 1000 MCG tablet Take 1,000 mcg by mouth daily.      EKGs/Labs/Other Studies Reviewed:    The following studies were reviewed today:  Cardiac Studies & Procedures   ______________________________________________________________________________________________     ECHOCARDIOGRAM  ECHOCARDIOGRAM COMPLETE 09/14/2023  Narrative ECHOCARDIOGRAM REPORT    Patient Name:   [name] Date of Exam: 09/14/2023 Medical Rec #:  [id number]    Height:       69.0 in Accession #:    [id number]   Weight:       171.0 lb Date of Birth:  08-11-1945     BSA:          1.933 m Patient Age:    78 years     BP:           120/80 mmHg Patient Gender: F            HR:           80 bpm. Exam Location:  [facility]  Procedure: 2D Echo, Cardiac Doppler, Color Doppler and Strain Analysis (Both Spectral and Color Flow Doppler were utilized during procedure).  Indications:    Nonrheumatic aortic valve stenosis [I35.0] Essential (primary) hypertension [I10] Statin intolerance [Z78.9] Mixed hyperlipidemia [E78.2] Coronary artery disease involving coronary bypass graft of native heart with angina pectoris (HCC) [I25.709]  History:        Patient has no prior history of Echocardiogram examinations and Patient has prior history of Echocardiogram examinations, most recent 07/16/2020. CAD, Aortic Valve Disease; Risk Factors:Hypertension and Dyslipidemia.  Sonographer:    Charlie Jointer  RDCS Referring Phys: [id number] Kaycee Mcgaugh J Marquis Diles  IMPRESSIONS   1. Left ventricular ejection fraction, by estimation, is 60 to 65%. The left ventricle has normal function. The left ventricle has no regional wall motion abnormalities. There is moderate left ventricular hypertrophy. Left ventricular diastolic parameters are consistent with Grade I diastolic dysfunction (impaired relaxation). The average left ventricular global longitudinal strain is -14.4 %. The global longitudinal strain is abnormal. 2. Right ventricular systolic function is normal. The right ventricular size is normal. Tricuspid regurgitation signal is inadequate for assessing PA pressure. 3. The mitral valve is degenerative. Trivial mitral valve regurgitation. No evidence of mitral stenosis. 4. The aortic valve was not well visualized. There is moderate thickening of the aortic valve. Aortic valve regurgitation is mild to moderate. Moderate aortic valve stenosis. Aortic valve area, by VTI measures 1.35 cm. Aortic valve mean gradient measures 28.0 mmHg. Aortic valve Vmax measures 3.59 m/s. 5. Aortic dilatation noted. There is mild dilatation of the aortic root, measuring 42 mm. There is borderline dilatation of the ascending aorta, measuring 37 mm. 6. The inferior vena cava is normal in size with greater than 50% respiratory variability, suggesting right atrial pressure of 3 mmHg.  Comparison(s): In comparison to prior TTE report 07/16/2020 LVEF similar, AS was mild to moderate with Vmax 2.80m/s and mean 16 mm Hg.  FINDINGS Left Ventricle: Left ventricular ejection fraction, by estimation, is 60 to 65%. The left ventricle has normal function. The left ventricle has no regional wall motion abnormalities. The average left ventricular global longitudinal strain is -14.4 %. Strain was performed and the global longitudinal strain is abnormal. The left ventricular internal cavity size was normal in size. There is moderate left ventricular  hypertrophy. Left ventricular diastolic parameters are consistent with Grade I diastolic dysfunction (impaired relaxation).  Right Ventricle: The right ventricular size is normal. Right vetricular wall thickness was not well visualized. Right ventricular systolic function is normal. Tricuspid regurgitation signal is inadequate for assessing PA pressure.  Left Atrium: Left atrial size was normal in size.  Right Atrium: Right atrial size was normal in size.  Pericardium: There is no evidence of pericardial effusion. Presence of epicardial fat layer.  Mitral Valve: The mitral valve is degenerative in appearance. Mild mitral annular calcification. Trivial mitral valve regurgitation. No evidence of mitral valve stenosis.  Tricuspid Valve: The tricuspid valve is not well visualized. Tricuspid valve regurgitation is trivial. No evidence of tricuspid stenosis.  Aortic Valve: The aortic valve was not well visualized.  There is moderate thickening of the aortic valve. Aortic valve regurgitation is mild to moderate. Aortic regurgitation PHT measures 527 msec. Moderate aortic stenosis is present. Aortic valve mean gradient measures 28.0 mmHg. Aortic valve peak gradient measures 51.6 mmHg. Aortic valve area, by VTI measures 1.35 cm.  Pulmonic Valve: The pulmonic valve was not well visualized. Pulmonic valve regurgitation is not visualized. No evidence of pulmonic stenosis.  Aorta: Aortic dilatation noted. There is mild dilatation of the aortic root, measuring 42 mm. There is borderline dilatation of the ascending aorta, measuring 37 mm.  Venous: The inferior vena cava is normal in size with greater than 50% respiratory variability, suggesting right atrial pressure of 3 mmHg.  IAS/Shunts: The interatrial septum was not well visualized.   LEFT VENTRICLE PLAX 2D LVIDd:         4.50 cm   Diastology LVIDs:         3.40 cm   LV e' medial:    6.31 cm/s LV PW:         1.00 cm   LV E/e' medial:  10.5 LV  IVS:        1.10 cm   LV e' lateral:   6.74 cm/s LVOT diam:     2.10 cm   LV E/e' lateral: 9.8 LV SV:         85 LV SV Index:   44        2D Longitudinal Strain LVOT Area:     3.46 cm  2D Strain GLS Avg:     -14.4 %   RIGHT VENTRICLE             IVC RV Basal diam:  2.90 cm     IVC diam: 1.60 cm RV Mid diam:    2.50 cm RV S prime:     10.70 cm/s TAPSE (M-mode): 1.3 cm  LEFT ATRIUM             Index        RIGHT ATRIUM           Index LA diam:        3.60 cm 1.86 cm/m   RA Area:     15.50 cm LA Vol (A2C):   52.7 ml 27.26 ml/m  RA Volume:   34.90 ml  18.06 ml/m LA Vol (A4C):   52.5 ml 27.16 ml/m LA Biplane Vol: 54.4 ml 28.14 ml/m AORTIC VALVE AV Area (Vmax):    1.23 cm AV Area (Vmean):   1.24 cm AV Area (VTI):     1.35 cm AV Vmax:           359.00 cm/s AV Vmean:          227.400 cm/s AV VTI:            0.628 m AV Peak Grad:      51.6 mmHg AV Mean Grad:      28.0 mmHg LVOT Vmax:         127.60 cm/s LVOT Vmean:        81.140 cm/s LVOT VTI:          0.245 m LVOT/AV VTI ratio: 0.39 AI PHT:            527 msec  AORTA Ao Root diam: 4.20 cm Ao Asc diam:  3.70 cm Ao Desc diam: 2.40 cm  MITRAL VALVE MV Area (PHT): 3.72 cm     SHUNTS MV Decel Time: 204 msec     Systemic VTI:  0.24 m MV E velocity: 66.10 cm/s   Systemic Diam: 2.10 cm MV A velocity: 125.00 cm/s MV E/A ratio:  0.53  Sreedhar reddy Madireddy Electronically signed by Alean reddy Madireddy Signature Date/Time: 09/15/2023/12:41:35 PM    Final          ______________________________________________________________________________________________      EKG Interpretation Date/Time:  Friday October 14 2023 15:15:40 EDT Ventricular Rate:  83 PR Interval:  148 QRS Duration:  92 QT Interval:  376 QTC Calculation: 441 R Axis:   76  Text Interpretation: Normal sinus rhythm Poor R wave progression LVH and repolarization changes When compared with ECG of 19-Oct-2022 16:31, Unchanged ST now depressed  in Inferior leads Confirmed by Monetta Rogue ([id number]) on 10/14/2023 3:19:07 PM  EKG Interpretation Date/Time:  Friday October 14 2023 15:15:40 EDT Ventricular Rate:  83 PR Interval:  148 QRS Duration:  92 QT Interval:  376 QTC Calculation: 441 R Axis:   76  Text Interpretation: Normal sinus rhythm Poor R wave progression LVH and repolarization changes When compared with ECG of 19-Oct-2022 16:31, Unchanged ST now depressed in Inferior leads Confirmed by Monetta Rogue ([id number]) on 10/14/2023 3:19:07 PM    Recent Labs: No results found for requested labs within last 365 days.  Recent Lipid Panel    Component Value Date/Time   TRIG 191 (H) 04/03/2014 0501    Physical Exam:    VS:  BP (!) 148/88   Pulse 83   Ht 5' 9 (1.753 m)   Wt 173 lb 6.4 oz (78.7 kg)   SpO2 98%   BMI 25.61 kg/m     Wt Readings from Last 3 Encounters:  10/14/23 173 lb 6.4 oz (78.7 kg)  10/19/22 171 lb (77.6 kg)  08/20/21 170 lb 1.3 oz (77.1 kg)     GEN:  Well nourished, well developed in no acute distress HEENT: Normal NECK: No JVD; No carotid bruits LYMPHATICS: No lymphadenopathy CARDIAC: Grade 2/6 systolic ejection murmur does not encompass S2 1 of 6 AR RRR RESPIRATORY:  Clear to auscultation without rales, wheezing or rhonchi  ABDOMEN: Soft, non-tender, non-distended MUSCULOSKELETAL:  No edema; No deformity  SKIN: Warm and dry NEUROLOGIC:  Alert and oriented x 3 PSYCHIATRIC:  Normal affect    Signed, Rogue Monetta, MD  10/14/2023 4:24 PM    [facility]
